# Patient Record
Sex: Female | Born: 1955
Health system: Southern US, Community
[De-identification: ages and names within clinical notes are randomized; demographics above are authoritative.]

## PROBLEM LIST (undated history)

## (undated) DIAGNOSIS — F32A Depression, unspecified: Secondary | ICD-10-CM

## (undated) DIAGNOSIS — N189 Chronic kidney disease, unspecified: Secondary | ICD-10-CM

## (undated) DIAGNOSIS — R350 Frequency of micturition: Secondary | ICD-10-CM

## (undated) DIAGNOSIS — E782 Mixed hyperlipidemia: Secondary | ICD-10-CM

## (undated) DIAGNOSIS — Z973 Presence of spectacles and contact lenses: Secondary | ICD-10-CM

## (undated) DIAGNOSIS — R42 Dizziness and giddiness: Secondary | ICD-10-CM

## (undated) DIAGNOSIS — M4800 Spinal stenosis, site unspecified: Secondary | ICD-10-CM

## (undated) DIAGNOSIS — N201 Calculus of ureter: Secondary | ICD-10-CM

## (undated) DIAGNOSIS — Z86010 Personal history of colonic polyps: Secondary | ICD-10-CM

## (undated) DIAGNOSIS — M1990 Unspecified osteoarthritis, unspecified site: Secondary | ICD-10-CM

## (undated) HISTORY — DX: Chronic kidney disease, unspecified: N18.9

## (undated) HISTORY — DX: Spinal stenosis, site unspecified: M48.00

## (undated) HISTORY — DX: Dizziness and giddiness: R42

## (undated) HISTORY — PX: COLONOSCOPY: SHX174

## (undated) HISTORY — PX: DILATION AND CURETTAGE OF UTERUS: SHX78

## (undated) HISTORY — DX: Unspecified osteoarthritis, unspecified site: M19.90

---

## 1974-10-14 HISTORY — PX: WISDOM TOOTH EXTRACTION: SHX21

## 1978-10-14 HISTORY — PX: DILATION AND CURETTAGE OF UTERUS: SHX78

## 1999-06-04 ENCOUNTER — Other Ambulatory Visit: Admission: RE | Admit: 1999-06-04 | Discharge: 1999-06-04 | Payer: Self-pay | Admitting: Internal Medicine

## 2000-06-06 ENCOUNTER — Other Ambulatory Visit: Admission: RE | Admit: 2000-06-06 | Discharge: 2000-06-06 | Payer: Self-pay | Admitting: Internal Medicine

## 2001-04-21 ENCOUNTER — Other Ambulatory Visit: Admission: RE | Admit: 2001-04-21 | Discharge: 2001-04-21 | Payer: Self-pay | Admitting: Internal Medicine

## 2002-04-20 ENCOUNTER — Other Ambulatory Visit: Admission: RE | Admit: 2002-04-20 | Discharge: 2002-04-20 | Payer: Self-pay | Admitting: Internal Medicine

## 2003-04-26 ENCOUNTER — Other Ambulatory Visit: Admission: RE | Admit: 2003-04-26 | Discharge: 2003-04-26 | Payer: Self-pay | Admitting: Internal Medicine

## 2004-05-01 ENCOUNTER — Other Ambulatory Visit: Admission: RE | Admit: 2004-05-01 | Discharge: 2004-05-01 | Payer: Self-pay | Admitting: Internal Medicine

## 2005-05-20 ENCOUNTER — Ambulatory Visit: Payer: Self-pay | Admitting: Internal Medicine

## 2005-05-24 ENCOUNTER — Ambulatory Visit: Payer: Self-pay | Admitting: Internal Medicine

## 2005-05-24 ENCOUNTER — Other Ambulatory Visit: Admission: RE | Admit: 2005-05-24 | Discharge: 2005-05-24 | Payer: Self-pay | Admitting: Internal Medicine

## 2005-06-21 ENCOUNTER — Ambulatory Visit: Payer: Self-pay | Admitting: Internal Medicine

## 2005-08-14 ENCOUNTER — Ambulatory Visit: Payer: Self-pay | Admitting: Internal Medicine

## 2005-08-15 ENCOUNTER — Encounter: Admission: RE | Admit: 2005-08-15 | Discharge: 2005-08-15 | Payer: Self-pay | Admitting: Internal Medicine

## 2005-08-28 ENCOUNTER — Ambulatory Visit: Payer: Self-pay | Admitting: Internal Medicine

## 2005-08-28 DIAGNOSIS — Z86010 Personal history of colonic polyps: Secondary | ICD-10-CM

## 2005-08-28 DIAGNOSIS — Z860101 Personal history of adenomatous and serrated colon polyps: Secondary | ICD-10-CM

## 2005-08-28 HISTORY — DX: Personal history of colonic polyps: Z86.010

## 2005-08-28 HISTORY — DX: Personal history of adenomatous and serrated colon polyps: Z86.0101

## 2006-06-20 ENCOUNTER — Ambulatory Visit: Payer: Self-pay | Admitting: Internal Medicine

## 2006-06-26 ENCOUNTER — Ambulatory Visit: Payer: Self-pay | Admitting: Internal Medicine

## 2006-06-26 ENCOUNTER — Other Ambulatory Visit: Admission: RE | Admit: 2006-06-26 | Discharge: 2006-06-26 | Payer: Self-pay | Admitting: Internal Medicine

## 2006-06-26 ENCOUNTER — Encounter: Payer: Self-pay | Admitting: Internal Medicine

## 2006-06-26 LAB — CONVERTED CEMR LAB

## 2006-09-10 ENCOUNTER — Encounter: Admission: RE | Admit: 2006-09-10 | Discharge: 2006-09-10 | Payer: Self-pay | Admitting: Internal Medicine

## 2007-06-25 ENCOUNTER — Ambulatory Visit: Payer: Self-pay | Admitting: Endocrinology

## 2007-06-25 LAB — CONVERTED CEMR LAB
ALT: 16 units/L (ref 0–35)
AST: 16 units/L (ref 0–37)
Albumin: 3.7 g/dL (ref 3.5–5.2)
Alkaline Phosphatase: 43 units/L (ref 39–117)
BUN: 8 mg/dL (ref 6–23)
Basophils Absolute: 0.1 10*3/uL (ref 0.0–0.1)
Basophils Relative: 0.9 % (ref 0.0–1.0)
Bilirubin Urine: NEGATIVE
Bilirubin, Direct: 0.1 mg/dL (ref 0.0–0.3)
CO2: 27 meq/L (ref 19–32)
CRP, High Sensitivity: 1 (ref 0.00–5.00)
Calcium: 8.7 mg/dL (ref 8.4–10.5)
Chloride: 107 meq/L (ref 96–112)
Cholesterol: 134 mg/dL (ref 0–200)
Creatinine, Ser: 0.7 mg/dL (ref 0.4–1.2)
Eosinophils Absolute: 0.1 10*3/uL (ref 0.0–0.6)
Eosinophils Relative: 2.2 % (ref 0.0–5.0)
GFR calc Af Amer: 113 mL/min
GFR calc non Af Amer: 93 mL/min
Glucose, Bld: 90 mg/dL (ref 70–99)
HCT: 37.4 % (ref 36.0–46.0)
HDL: 51.7 mg/dL (ref >39.0)
Hemoglobin, Urine: NEGATIVE
Hemoglobin: 12.9 g/dL (ref 12.0–15.0)
Ketones, ur: NEGATIVE mg/dL
LDL Cholesterol: 76 mg/dL (ref 0–99)
Leukocytes, UA: NEGATIVE
Lymphocytes Relative: 30.5 % (ref 12.0–46.0)
MCHC: 34.4 g/dL (ref 30.0–36.0)
MCV: 89.1 fL (ref 78.0–100.0)
Monocytes Absolute: 0.6 10*3/uL (ref 0.2–0.7)
Monocytes Relative: 10 % (ref 3.0–11.0)
Neutro Abs: 3.3 10*3/uL (ref 1.4–7.7)
Neutrophils Relative %: 56.4 % (ref 43.0–77.0)
Nitrite: NEGATIVE
Platelets: 245 10*3/uL (ref 150–400)
Potassium: 4.1 meq/L (ref 3.5–5.1)
RBC: 4.2 M/uL (ref 3.87–5.11)
RDW: 12.6 % (ref 11.5–14.6)
Rheumatoid fact SerPl-aCnc: <20 intl units/mL — ABNORMAL LOW (ref 0.0–20.0)
Sodium: 138 meq/L (ref 135–145)
Specific Gravity, Urine: 1.025 (ref 1.000–1.03)
TSH: 1.53 microintl units/mL (ref 0.35–5.50)
Total Bilirubin: 0.7 mg/dL (ref 0.3–1.2)
Total CHOL/HDL Ratio: 2.6
Total Protein, Urine: NEGATIVE mg/dL
Total Protein: 6.9 g/dL (ref 6.0–8.3)
Triglycerides: 33 mg/dL (ref 0–149)
Urine Glucose: NEGATIVE mg/dL
Urobilinogen, UA: 0.2 (ref 0.0–1.0)
VLDL: 7 mg/dL (ref 0–40)
WBC: 5.9 10*3/uL (ref 4.5–10.5)
pH: 6 (ref 5.0–8.0)

## 2007-06-29 ENCOUNTER — Ambulatory Visit: Payer: Self-pay | Admitting: Internal Medicine

## 2007-06-29 ENCOUNTER — Encounter: Payer: Self-pay | Admitting: Endocrinology

## 2007-06-29 LAB — CONVERTED CEMR LAB: Anti Nuclear Antibody(ANA): NEGATIVE

## 2007-06-30 ENCOUNTER — Encounter: Payer: Self-pay | Admitting: Internal Medicine

## 2007-06-30 DIAGNOSIS — E785 Hyperlipidemia, unspecified: Secondary | ICD-10-CM

## 2007-06-30 DIAGNOSIS — M2550 Pain in unspecified joint: Secondary | ICD-10-CM | POA: Insufficient documentation

## 2007-09-30 ENCOUNTER — Encounter: Admission: RE | Admit: 2007-09-30 | Discharge: 2007-09-30 | Payer: Self-pay | Admitting: Internal Medicine

## 2007-10-14 ENCOUNTER — Encounter: Admission: RE | Admit: 2007-10-14 | Discharge: 2007-10-14 | Payer: Self-pay | Admitting: Internal Medicine

## 2008-07-08 ENCOUNTER — Ambulatory Visit: Payer: Self-pay | Admitting: Internal Medicine

## 2008-07-08 LAB — CONVERTED CEMR LAB
ALT: 25 units/L (ref 0–35)
AST: 23 units/L (ref 0–37)
Albumin: 3.9 g/dL (ref 3.5–5.2)
Alkaline Phosphatase: 51 units/L (ref 39–117)
BUN: 17 mg/dL (ref 6–23)
Basophils Absolute: 0.1 10*3/uL (ref 0.0–0.1)
Basophils Relative: 1.1 % (ref 0.0–3.0)
Bilirubin Urine: NEGATIVE
Bilirubin, Direct: 0.2 mg/dL (ref 0.0–0.3)
CO2: 30 meq/L (ref 19–32)
Calcium: 9.2 mg/dL (ref 8.4–10.5)
Chloride: 111 meq/L (ref 96–112)
Cholesterol: 136 mg/dL (ref 0–200)
Creatinine, Ser: 0.8 mg/dL (ref 0.4–1.2)
Crystals: NEGATIVE
Eosinophils Absolute: 0.1 10*3/uL (ref 0.0–0.7)
Eosinophils Relative: 2 % (ref 0.0–5.0)
GFR calc Af Amer: 96 mL/min
GFR calc non Af Amer: 80 mL/min
Glucose, Bld: 95 mg/dL (ref 70–99)
HCT: 39.1 % (ref 36.0–46.0)
HDL: 54 mg/dL (ref >39.0)
Hemoglobin: 13.4 g/dL (ref 12.0–15.0)
Ketones, ur: NEGATIVE mg/dL
LDL Cholesterol: 74 mg/dL (ref 0–99)
Leukocytes, UA: NEGATIVE
Lymphocytes Relative: 25.7 % (ref 12.0–46.0)
MCHC: 34.4 g/dL (ref 30.0–36.0)
MCV: 89.6 fL (ref 78.0–100.0)
Monocytes Absolute: 0.7 10*3/uL (ref 0.1–1.0)
Monocytes Relative: 9.7 % (ref 3.0–12.0)
Mucus, UA: NEGATIVE
Neutro Abs: 4.2 10*3/uL (ref 1.4–7.7)
Neutrophils Relative %: 61.5 % (ref 43.0–77.0)
Nitrite: NEGATIVE
Platelets: 235 10*3/uL (ref 150–400)
Potassium: 4.5 meq/L (ref 3.5–5.1)
RBC: 4.37 M/uL (ref 3.87–5.11)
RDW: 12.8 % (ref 11.5–14.6)
Sodium: 143 meq/L (ref 135–145)
Specific Gravity, Urine: 1.025 (ref 1.000–1.03)
TSH: 1.72 microintl units/mL (ref 0.35–5.50)
Total Bilirubin: 0.5 mg/dL (ref 0.3–1.2)
Total CHOL/HDL Ratio: 2.5
Total Protein, Urine: NEGATIVE mg/dL
Total Protein: 7.3 g/dL (ref 6.0–8.3)
Triglycerides: 42 mg/dL (ref 0–149)
Urine Glucose: NEGATIVE mg/dL
Urobilinogen, UA: 0.2 (ref 0.0–1.0)
VLDL: 8 mg/dL (ref 0–40)
WBC: 6.9 10*3/uL (ref 4.5–10.5)
pH: 6.5 (ref 5.0–8.0)

## 2008-07-14 ENCOUNTER — Other Ambulatory Visit: Admission: RE | Admit: 2008-07-14 | Discharge: 2008-07-14 | Payer: Self-pay | Admitting: Internal Medicine

## 2008-07-14 ENCOUNTER — Encounter: Payer: Self-pay | Admitting: Internal Medicine

## 2008-07-14 ENCOUNTER — Ambulatory Visit: Payer: Self-pay | Admitting: Internal Medicine

## 2008-07-19 ENCOUNTER — Encounter: Payer: Self-pay | Admitting: Internal Medicine

## 2008-10-03 ENCOUNTER — Encounter: Admission: RE | Admit: 2008-10-03 | Discharge: 2008-10-03 | Payer: Self-pay | Admitting: Internal Medicine

## 2008-10-13 ENCOUNTER — Encounter: Admission: RE | Admit: 2008-10-13 | Discharge: 2008-10-13 | Payer: Self-pay | Admitting: Internal Medicine

## 2008-10-20 ENCOUNTER — Encounter: Payer: Self-pay | Admitting: Internal Medicine

## 2009-06-13 ENCOUNTER — Telehealth: Payer: Self-pay | Admitting: Internal Medicine

## 2009-07-26 ENCOUNTER — Ambulatory Visit: Payer: Self-pay | Admitting: Internal Medicine

## 2009-07-26 LAB — CONVERTED CEMR LAB
ALT: 23 units/L (ref 0–35)
AST: 25 units/L (ref 0–37)
Albumin: 3.9 g/dL (ref 3.5–5.2)
Alkaline Phosphatase: 57 units/L (ref 39–117)
BUN: 15 mg/dL (ref 6–23)
Basophils Absolute: 0 10*3/uL (ref 0.0–0.1)
Basophils Relative: 0.8 % (ref 0.0–3.0)
Bilirubin Urine: NEGATIVE
Bilirubin, Direct: 0 mg/dL (ref 0.0–0.3)
CO2: 29 meq/L (ref 19–32)
Calcium: 9.4 mg/dL (ref 8.4–10.5)
Chloride: 104 meq/L (ref 96–112)
Cholesterol: 181 mg/dL (ref 0–200)
Creatinine, Ser: 0.7 mg/dL (ref 0.4–1.2)
Eosinophils Absolute: 0.1 10*3/uL (ref 0.0–0.7)
Eosinophils Relative: 1.7 % (ref 0.0–5.0)
GFR calc non Af Amer: 92.59 mL/min (ref >60)
Glucose, Bld: 83 mg/dL (ref 70–99)
HCT: 37.6 % (ref 36.0–46.0)
HDL: 56 mg/dL (ref >39.00)
Hemoglobin: 13 g/dL (ref 12.0–15.0)
Ketones, ur: NEGATIVE mg/dL
LDL Cholesterol: 113 mg/dL — ABNORMAL HIGH (ref 0–99)
Leukocytes, UA: NEGATIVE
Lymphocytes Relative: 30.2 % (ref 12.0–46.0)
Lymphs Abs: 1.6 10*3/uL (ref 0.7–4.0)
MCHC: 34.7 g/dL (ref 30.0–36.0)
MCV: 90.6 fL (ref 78.0–100.0)
Monocytes Absolute: 0.5 10*3/uL (ref 0.1–1.0)
Monocytes Relative: 10.1 % (ref 3.0–12.0)
Neutro Abs: 3.2 10*3/uL (ref 1.4–7.7)
Neutrophils Relative %: 57.2 % (ref 43.0–77.0)
Nitrite: NEGATIVE
Platelets: 264 10*3/uL (ref 150.0–400.0)
Potassium: 4.1 meq/L (ref 3.5–5.1)
RBC: 4.15 M/uL (ref 3.87–5.11)
RDW: 12.5 % (ref 11.5–14.6)
Sodium: 142 meq/L (ref 135–145)
Specific Gravity, Urine: 1.025 (ref 1.000–1.030)
TSH: 2.24 microintl units/mL (ref 0.35–5.50)
Total Bilirubin: 0.9 mg/dL (ref 0.3–1.2)
Total CHOL/HDL Ratio: 3
Total Protein, Urine: NEGATIVE mg/dL
Total Protein: 7.8 g/dL (ref 6.0–8.3)
Triglycerides: 62 mg/dL (ref 0.0–149.0)
Urine Glucose: NEGATIVE mg/dL
Urobilinogen, UA: 0.2 (ref 0.0–1.0)
VLDL: 12.4 mg/dL (ref 0.0–40.0)
WBC: 5.4 10*3/uL (ref 4.5–10.5)
pH: 5 (ref 5.0–8.0)

## 2009-07-31 ENCOUNTER — Ambulatory Visit: Payer: Self-pay | Admitting: Internal Medicine

## 2009-07-31 ENCOUNTER — Encounter: Payer: Self-pay | Admitting: Internal Medicine

## 2009-07-31 ENCOUNTER — Other Ambulatory Visit: Admission: RE | Admit: 2009-07-31 | Discharge: 2009-07-31 | Payer: Self-pay | Admitting: Internal Medicine

## 2009-08-21 ENCOUNTER — Encounter: Payer: Self-pay | Admitting: Internal Medicine

## 2009-10-04 ENCOUNTER — Encounter: Admission: RE | Admit: 2009-10-04 | Discharge: 2009-10-04 | Payer: Self-pay | Admitting: Internal Medicine

## 2010-07-19 ENCOUNTER — Encounter: Payer: Self-pay | Admitting: Internal Medicine

## 2010-08-24 ENCOUNTER — Ambulatory Visit: Payer: Self-pay | Admitting: Internal Medicine

## 2010-08-24 LAB — CONVERTED CEMR LAB
ALT: 19 units/L (ref 0–35)
AST: 23 units/L (ref 0–37)
Albumin: 4.1 g/dL (ref 3.5–5.2)
Alkaline Phosphatase: 61 units/L (ref 39–117)
BUN: 17 mg/dL (ref 6–23)
Basophils Absolute: 0.1 10*3/uL (ref 0.0–0.1)
Basophils Relative: 0.7 % (ref 0.0–3.0)
Bilirubin Urine: NEGATIVE
Bilirubin, Direct: 0.1 mg/dL (ref 0.0–0.3)
CO2: 28 meq/L (ref 19–32)
Calcium: 9.7 mg/dL (ref 8.4–10.5)
Chloride: 104 meq/L (ref 96–112)
Cholesterol: 171 mg/dL (ref 0–200)
Creatinine, Ser: 0.7 mg/dL (ref 0.4–1.2)
Eosinophils Absolute: 0.1 10*3/uL (ref 0.0–0.7)
Eosinophils Relative: 1.1 % (ref 0.0–5.0)
GFR calc non Af Amer: 98.7 mL/min (ref >60)
Glucose, Bld: 77 mg/dL (ref 70–99)
HCT: 40.8 % (ref 36.0–46.0)
HDL: 68 mg/dL (ref >39.00)
Hemoglobin: 14.1 g/dL (ref 12.0–15.0)
Ketones, ur: 40 mg/dL
LDL Cholesterol: 94 mg/dL (ref 0–99)
Leukocytes, UA: NEGATIVE
Lymphocytes Relative: 28.1 % (ref 12.0–46.0)
Lymphs Abs: 2.1 10*3/uL (ref 0.7–4.0)
MCHC: 34.5 g/dL (ref 30.0–36.0)
MCV: 90.2 fL (ref 78.0–100.0)
Monocytes Absolute: 0.6 10*3/uL (ref 0.1–1.0)
Monocytes Relative: 8.7 % (ref 3.0–12.0)
Neutro Abs: 4.5 10*3/uL (ref 1.4–7.7)
Neutrophils Relative %: 61.4 % (ref 43.0–77.0)
Nitrite: NEGATIVE
Platelets: 273 10*3/uL (ref 150.0–400.0)
Potassium: 4.4 meq/L (ref 3.5–5.1)
RBC: 4.53 M/uL (ref 3.87–5.11)
RDW: 13 % (ref 11.5–14.6)
Sodium: 139 meq/L (ref 135–145)
Specific Gravity, Urine: >=1.03 (ref 1.000–1.030)
TSH: 1.59 microintl units/mL (ref 0.35–5.50)
Total Bilirubin: 0.7 mg/dL (ref 0.3–1.2)
Total CHOL/HDL Ratio: 3
Total Protein, Urine: NEGATIVE mg/dL
Total Protein: 7.5 g/dL (ref 6.0–8.3)
Triglycerides: 44 mg/dL (ref 0.0–149.0)
Urine Glucose: NEGATIVE mg/dL
Urobilinogen, UA: 0.2 (ref 0.0–1.0)
VLDL: 8.8 mg/dL (ref 0.0–40.0)
WBC: 7.3 10*3/uL (ref 4.5–10.5)
pH: 5 (ref 5.0–8.0)

## 2010-08-30 ENCOUNTER — Other Ambulatory Visit: Admission: RE | Admit: 2010-08-30 | Discharge: 2010-08-30 | Payer: Self-pay | Admitting: Internal Medicine

## 2010-08-30 ENCOUNTER — Ambulatory Visit: Payer: Self-pay | Admitting: Internal Medicine

## 2010-08-30 ENCOUNTER — Encounter: Payer: Self-pay | Admitting: Internal Medicine

## 2010-08-30 DIAGNOSIS — K635 Polyp of colon: Secondary | ICD-10-CM | POA: Insufficient documentation

## 2010-08-30 LAB — CONVERTED CEMR LAB: Pap Smear: NEGATIVE

## 2010-09-08 ENCOUNTER — Encounter: Payer: Self-pay | Admitting: Internal Medicine

## 2010-10-04 ENCOUNTER — Encounter: Payer: Self-pay | Admitting: Internal Medicine

## 2010-10-12 ENCOUNTER — Encounter
Admission: RE | Admit: 2010-10-12 | Discharge: 2010-10-12 | Payer: Self-pay | Source: Home / Self Care | Attending: Internal Medicine | Admitting: Internal Medicine

## 2010-10-14 HISTORY — PX: COLONOSCOPY: SHX174

## 2010-11-03 ENCOUNTER — Encounter: Payer: Self-pay | Admitting: Internal Medicine

## 2010-11-04 ENCOUNTER — Encounter: Payer: Self-pay | Admitting: Internal Medicine

## 2010-11-08 ENCOUNTER — Encounter (INDEPENDENT_AMBULATORY_CARE_PROVIDER_SITE_OTHER): Payer: Self-pay | Admitting: *Deleted

## 2010-11-09 ENCOUNTER — Ambulatory Visit
Admission: RE | Admit: 2010-11-09 | Discharge: 2010-11-09 | Payer: Self-pay | Source: Home / Self Care | Attending: Gastroenterology | Admitting: Gastroenterology

## 2010-11-13 NOTE — Letter (Signed)
Summary: Endoscopy Letter  [facility]  [address] [place], [place] [address]   Phone: [phone]  Fax: [phone]      July 19, 2010 MRN: [id number]   [facility] [address] [place], [place]  [address]   Dear Ms. Broers,   According to your medical record, it is time for you to schedule an Endoscopy. Endoscopic screening is recommended for patients with certain upper digestive tract conditions because of associated increased risk for cancers of the upper digestive system.  This letter has been generated based on the recommendations made at the time of your prior procedure. If you feel that in your particular situation this may no longer apply, please contact our office.  Please call our office at [phone]) to schedule this appointment or to update your records at your earliest convenience.  Thank you for cooperating with [place] to provide you with the very best care possible.   Sincerely,  Wilhemina Bonito. Marina Goodell, M.D.  [facility] Gastroenterology Division [phone]

## 2010-11-13 NOTE — Assessment & Plan Note (Signed)
Summary: CPX/#/CD  PRESSED 1 TO CX/ LMOM TO CONFIRM/NWS   Vital Signs:  Patient profile:   55 year old female Height:      65 inches Weight:      160 pounds BMI:     26.72 O2 Sat:      97 % on Room air Temp:     97.6 degrees F oral Pulse rate:   80 / minute BP sitting:   124 / 88  (left arm) Cuff size:   regular  Vitals Entered By: Bill Salinas CMA (August 30, 2010 1:36 PM)  O2 Flow:  Room air CC: cpx/ ab   Primary Care Provider:  Chadrick Sprinkle  CC:  cpx/ ab.  History of Present Illness: Patient reports that in April she "threw out her back and was unable to walk and lost flexibility and had constant pain. She went to the podiatrist and was diagnosed with plantar fascititis bilaterally. She went to the Chiropractor who diagnosed disk damage in the low back. she has had a series of adjustments and is now dong better. She was assigned a series of back exercises but she has not had time to do this.   Current Medications (verified): 1)  Vytorin 10-20 Mg  Tabs (Ezetimibe-Simvastatin) .... Q Pm  Allergies (verified): No Known Drug Allergies  Past History:  Past Medical History: Last updated: 06/30/2007 UCD Hyperlipidemia  Past Surgical History: Last updated: 06/30/2007 D&C C-section x 2  Family History: Last updated: 07/17/08 GI cancer-grandmother died of cancer of unknown origin Father died CHF at 93 Mother  1926: with CAD/MI Brother with CABG at 42 Maternal Aunt with MI at 44 hyperlipidemia Neg-breast or colon cancer; DM  Social History: Last updated: 07/17/2008 [employer] - [employer], [employer] -BA-music married '84.  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    Physical Exam  General:  moderately overweight white woman in no distress Head:  Normocephalic and atraumatic without obvious abnormalities. No apparent alopecia or balding. Eyes:  No corneal or conjunctival inflammation noted. EOMI. Perrla. Funduscopic exam benign, without hemorrhages, exudates or papilledema. Vision grossly normal. Ears:  External ear exam shows no significant lesions or deformities.  Otoscopic examination reveals clear canals, tympanic membranes are intact bilaterally without bulging, retraction, inflammation or discharge. Hearing is grossly normal bilaterally. Nose:  no external deformity and no external erythema.   Mouth:  Oral mucosa and oropharynx without lesions or exudates.  Teeth in good repair. Neck:  supple, no thyromegaly, and no carotid bruits.   Chest Wall:  no deformities and no mass.   Breasts:  No mass, nodules, thickening, tenderness, bulging, retraction, inflamation, nipple discharge or skin changes noted.   Lungs:  Normal respiratory effort, chest expands symmetrically. Lungs are clear to auscultation, no crackles or wheezes. Heart:  Normal rate and regular rhythm. S1 and S2 normal without gallop, murmur, click, rub or other extra sounds. Abdomen:  soft, non-tender, normal bowel sounds, no guarding, and no hepatomegaly.   Genitalia:  Pelvic Exam:        External: normal female genitalia without lesions or masses        Vagina: normal without lesions or masses  Cervix: normal without lesions or masses        Adnexa: normal bimanual exam without masses or fullness        Uterus: normal by palpation        Pap smear: performed Msk:  normal ROM, no joint tenderness, no joint swelling, no joint warmth, and no joint instability.   Pulses:  2+ radial and DP pulses Extremities:  No clubbing, cyanosis, edema, or deformity noted with normal full range of motion of all joints.   Neurologic:   alert & oriented X3, cranial nerves II-XII intact, strength normal in all extremities, sensation intact to pinprick, gait normal, and DTRs symmetrical and normal.   Skin:  turgor normal, color normal, no rashes, no suspicious lesions, no petechiae, and no ulcerations.   Cervical Nodes:  no anterior cervical adenopathy.   Axillary Nodes:  no R axillary adenopathy and no L axillary adenopathy.   Psych:  Oriented X3, memory intact for recent and remote, normally interactive, and good eye contact.     Impression & Recommendations:  Problem # 1:  HYPERLIPIDEMIA (ICD-272.4) Great control on present medication.  Plan - continue present regimen  Her updated medication list for this problem includes:    Vytorin 10-20 Mg Tabs (Ezetimibe-simvastatin) ..... Q pm  Problem # 2:  Preventive Health Care (ICD-V70.0) Interval history significant for foot and back pain for which she has been seen. She is starting to feel better. Her exam is unremarkable with no evidence of radiculopathy.  If her pain continues will be happy to assist with referrals as needed.  Normal well woman exam as noted. Her lab results are within normal limits. She is current with colorectal cancer screening with last colonoscopy Nov '06 and is referred for 5 year followup. Immunizations not documented for tetnus and influenza.   Weight management discussed in detail: she is counseled about smart food choices, portion size control incluidng the meal of [address] chews and multiple small feedings while giving up candy (which she had done!) and junk calories., and regluar exercise - 30 minutes 3 times a week with a target heart rate of 120. Goal is to loose 1 lb/month with a target of 25 lbs total weight loss.  In summary - a very nice woman who appears to be medically stable at this time. she will return as needed or in 1 year.   Complete Medication List: 1)  Vytorin 10-20 Mg Tabs (Ezetimibe-simvastatin) .... Q pm  Other  Orders: Gastroenterology Referral (GI)  Patient: Tina Aguilar Note: All result statuses are Final unless otherwise noted.  Tests: (1) Lipid Panel (LIPID)   Cholesterol               171 mg/dL                   6-045     ATP III Classification            Desirable:  < 200 mg/dL                    Borderline High:  200 - 239 mg/dL               High:  > = 240 mg/dL   Triglycerides             44.0 mg/dL                  4.0-981.1     Normal:  <150 mg/dL  Borderline High:  150 - 199 mg/dL   HDL                       62.95 mg/dL                 >28.41   VLDL Cholesterol          8.8 mg/dL                   3.2-44.0   LDL Cholesterol           94 mg/dL                    1-02  CHO/HDL Ratio:  CHD Risk                             3                    Men          Women     1/2 Average Risk     3.4          3.3     Average Risk          5.0          4.4     2X Average Risk          9.6          7.1     3X Average Risk          15.0          11.0                           Tests: (2) BMP (METABOL)   Sodium                    139 mEq/L                   135-145   Potassium                 4.4 mEq/L                   3.5-5.1   Chloride                  104 mEq/L                   96-112   Carbon Dioxide            28 mEq/L                    19-32   Glucose                   77 mg/dL                    72-53   BUN                       17 mg/dL                    6-64   Creatinine                0.7 mg/dL  0.4-1.2   Calcium                   9.7 mg/dL                   1.6-10.9   GFR                       98.70 mL/min                >60  Tests: (3) CBC Platelet w/Diff (CBCD)   White Cell Count          7.3 K/uL                    4.5-10.5   Red Cell Count            4.53 Mil/uL                 3.87-5.11   Hemoglobin                14.1 g/dL                   60.4-54.0   Hematocrit                40.8 %                      36.0-46.0   MCV                        90.2 fl                     78.0-100.0   MCHC                      34.5 g/dL                   98.1-19.1   RDW                       13.0 %                      11.5-14.6   Platelet Count            273.0 K/uL                  150.0-400.0   Neutrophil %              61.4 %                      43.0-77.0   Lymphocyte %              28.1 %                      12.0-46.0   Monocyte %                8.7 %                       3.0-12.0   Eosinophils%              1.1 %                       0.0-5.0   Basophils %  0.7 %                       0.0-3.0   Neutrophill Absolute      4.5 K/uL                    1.4-7.7   Lymphocyte Absolute       2.1 K/uL                    0.7-4.0   Monocyte Absolute         0.6 K/uL                    0.1-1.0  Eosinophils, Absolute                             0.1 K/uL                    0.0-0.7   Basophils Absolute        0.1 K/uL                    0.0-0.1  Tests: (4) Hepatic/Liver Function Panel (HEPATIC)   Total Bilirubin           0.7 mg/dL                   0.9-8.1   Direct Bilirubin          0.1 mg/dL                   1.9-1.4   Alkaline Phosphatase      61 U/L                      39-117   AST                       23 U/L                      0-37   ALT                       19 U/L                      0-35   Total Protein             7.5 g/dL                    7.8-2.9   Albumin                   4.1 g/dL                    5.6-2.1  Tests: (5) TSH (TSH)   FastTSH                   1.59 uIU/mL                 0.35-5.50  Tests: (6) UDip w/Micro (URINE)   Color                     LT. YELLOW       RANGE:  Yellow;Lt. Yellow   Clarity  CLEAR                       Clear   Specific Gravity          >=1.030                     1.000 - 1.030   Urine Ph                  5.0                         5.0-8.0   Protein                   NEGATIVE                    Negative   Urine Glucose             NEGATIVE                     Negative   Ketones                   40                          Negative   Urine Bilirubin           NEGATIVE                    Negative   Blood                     SMALL                       Negative   Urobilinogen              0.2                         0.0 - 1.0   Leukocyte Esterace        NEGATIVE                    Negative   Nitrite                   NEGATIVE                    Negative   Urine WBC                 0-2/hpf                     0-2/hpf   Urine RBC                 3-6/hpf                     0-2/hpf   Urine Epith               Few(5-10/hpf)               Rare(0-4/hpf)   Urine Bacteria            Rare(<10/hpf)               None  Orders Added: 1)  Gastroenterology Referral [GI] 2)  Est. Patient 40-64 years [phone]

## 2010-11-13 NOTE — Letter (Signed)
   [facility] [address] [place], [place]  [address] Phone: [phone]      September 09, 2010   [facility] [address] [place], [place] [address]  RE:  LAB RESULTS  Dear  Ms. Strahm,  The following is an interpretation of your most recent lab tests.  Please take note of any instructions provided or changes to medications that have resulted from your lab work.  Pap Smear: normal    Have a great Holiday season and a good year.   Sincerely Yours,    Jacques Navy MD

## 2010-11-15 NOTE — Letter (Signed)
Summary: Pre Visit Letter Revised  [facility]  [address] [place], [place] [address]   Phone: [phone]  Fax: [phone]        10/04/2010 MRN: [id number]  [facility] [address] [place] [place]  [address]             Procedure Date:  11-23-10  9:30am            Dr Marina Goodell - Recall Colon  Welcome to the Gastroenterology Division at [facility].    You are scheduled to see a nurse for your pre-procedure visit on 11-09-10 at 4pm on the 3rd floor at [facility], [address]  We ask that you try to arrive at our office 15 minutes prior to your appointment time to allow for check-in.  Please take a minute to review the attached form.  If you answer "Yes" to one or more of the questions on the first page, we ask that you call the person listed at your earliest opportunity.  If you answer "No" to all of the questions, please complete the rest of the form and bring it to your appointment.    Your nurse visit will consist of discussing your medical and surgical history, your immediate family medical history, and your medications.   If you are unable to list all of your medications on the form, please bring the medication bottles to your appointment and we will list them.  We will need to be aware of both prescribed and over the counter drugs.  We will need to know exact dosage information as well.    Please be prepared to read and sign documents such as consent forms, a financial agreement, and acknowledgement forms.  If necessary, and with your consent, a friend or relative is welcome to sit-in on the nurse visit with you.  Please bring your insurance card so that we may make a copy of it.  If your insurance requires a referral to see a specialist, please bring your referral form from your primary care physician.  No co-pay is required for this nurse visit.     If you cannot keep your appointment, please call [phone] to cancel or reschedule  prior to your appointment date.  This allows [place] the opportunity to schedule an appointment for another patient in need of care.    Thank you for choosing [facility] for your medical needs.  We appreciate the opportunity to care for you.  Please visit [place] at our website  to learn more about our practice.  Sincerely, The Gastroenterology Division

## 2010-11-15 NOTE — Letter (Signed)
Summary: [facility] Instructions  [facility]  [address] [place], [place] [address]   Phone: [phone]  Fax: [phone]       Tina Aguilar    1956-03-15    MRN: [id number]        Procedure Day Dorna Bloom:   Farrell Ours  11/23/10     Arrival Time:  8:30AM     Procedure Time:  9:30AM     Location of Procedure:                    Juliann Pares _  [facility] (4th Floor)  PREPARATION FOR COLONOSCOPY WITH MOVIPREP   Starting 5 days prior to your procedure 11/18/10 do not eat nuts, seeds, popcorn, corn, beans, peas,  salads, or any raw vegetables.  Do not take any fiber supplements (e.g. Metamucil, Citrucel, and Benefiber).  THE DAY BEFORE YOUR PROCEDURE         DATE: 11/22/10  DAY: THURSDAY  1.  Drink clear liquids the entire day-NO SOLID FOOD  2.  Do not drink anything colored red or purple.  Avoid juices with pulp.  No orange juice.  3.  Drink at least 64 oz. (8 glasses) of fluid/clear liquids during the day to prevent dehydration and help the prep work efficiently.  CLEAR LIQUIDS INCLUDE: Water Jello Ice Popsicles Tea (sugar ok, no milk/cream) Powdered fruit flavored drinks Coffee (sugar ok, no milk/cream) Gatorade Juice: apple, white grape, white cranberry  Lemonade Clear bullion, consomm, broth Carbonated beverages (any kind) Strained chicken noodle soup Hard Candy                             4.  In the morning, mix first dose of MoviPrep solution:    Empty 1 Pouch A and 1 Pouch B into the disposable container    Add lukewarm drinking water to the top line of the container. Mix to dissolve    Refrigerate (mixed solution should be used within 24 hrs)  5.  Begin drinking the prep at 5:00 p.m. The MoviPrep container is divided by 4 marks.   Every 15 minutes drink the solution down to the next mark (approximately 8 oz) until the full liter is complete.   6.  Follow completed prep with 16 oz of clear liquid of your choice (Nothing red or purple).   Continue to drink clear liquids until bedtime.  7.  Before going to bed, mix second dose of MoviPrep solution:    Empty 1 Pouch A and 1 Pouch B into the disposable container    Add lukewarm drinking water to the top line of the container. Mix to dissolve    Refrigerate  THE DAY OF YOUR PROCEDURE      DATE: 11/23/10  DAY: FRIDAY  Beginning at 4:30AM (5 hours before procedure):         1. Every 15 minutes, drink the solution down to the next mark (approx 8 oz) until the full liter is complete.  2. Follow completed prep with 16 oz. of clear liquid of your choice.    3. You may drink clear liquids until 7:30AM (2 HOURS BEFORE PROCEDURE).   MEDICATION INSTRUCTIONS  Unless otherwise instructed, you should take regular prescription medications with a small sip of water   as early as possible the morning of your procedure.         OTHER INSTRUCTIONS  You will need a responsible adult at  least 56 years of age to accompany you and drive you home.   This person must remain in the waiting room during your procedure.  Wear loose fitting clothing that is easily removed.  Leave jewelry and other valuables at home.  However, you may wish to bring a book to read or  an iPod/MP3 player to listen to music as you wait for your procedure to start.  Remove all body piercing jewelry and leave at home.  Total time from sign-in until discharge is approximately 2-3 hours.  You should go home directly after your procedure and rest.  You can resume normal activities the  day after your procedure.  The day of your procedure you should not:   Drive   Make legal decisions   Operate machinery   Drink alcohol   Return to work  You will receive specific instructions about eating, activities and medications before you leave.    The above instructions have been reviewed and explained to me by   Wyona Almas RN  November 09, 2010 4:20 PM     I fully understand and can verbalize these  instructions _____________________________ Date _________

## 2010-11-15 NOTE — Miscellaneous (Signed)
Summary: [facility] Previsit/prep  Clinical Lists Changes  Medications: Added new medication of MOVIPREP 100 GM  SOLR (PEG-KCL-NACL-NASULF-NA ASC-C) As per prep instructions. - Signed Rx of MOVIPREP 100 GM  SOLR (PEG-KCL-NACL-NASULF-NA ASC-C) As per prep instructions.;  #1 x 0;  Signed;  Entered by: Wyona Almas RN;  Authorized by: Rachael Fee MD;  Method used: Electronically to [facility], [place], [place], [place]  [address], Ph: [phone], Fax: [phone] Observations: Added new observation of NKA: T (11/09/2010 15:43)    Prescriptions: MOVIPREP 100 GM  SOLR (PEG-KCL-NACL-NASULF-NA ASC-C) As per prep instructions.  #1 x 0   Entered by:   Wyona Almas RN   Authorized by:   Rachael Fee MD   Signed by:   Wyona Almas RN on 11/09/2010   Method used:   Electronically to        [facility] (retail)       [address]       [place], [place]  [address]       Ph: [phone]       Fax: [phone]   RxID:   [id number]

## 2010-11-23 ENCOUNTER — Other Ambulatory Visit (AMBULATORY_SURGERY_CENTER): Payer: Federal, State, Local not specified - PPO | Admitting: Internal Medicine

## 2010-11-23 ENCOUNTER — Encounter: Payer: Self-pay | Admitting: Internal Medicine

## 2010-11-23 DIAGNOSIS — Z86010 Personal history of colonic polyps: Secondary | ICD-10-CM

## 2010-11-23 DIAGNOSIS — Z1211 Encounter for screening for malignant neoplasm of colon: Secondary | ICD-10-CM

## 2010-11-29 NOTE — Procedures (Addendum)
Summary: Colonoscopy  Patient: [name] Note: All result statuses are Final unless otherwise noted.  Tests: (1) Colonoscopy (COL)   COL Colonoscopy           DONE     [facility]     [address]     [place], [place]  [address]           COLONOSCOPY PROCEDURE REPORT           PATIENT:  Tina Aguilar, Tina Aguilar  MR#:  [id number]     BIRTHDATE:  1956-05-15, 54 yrs. old  GENDER:  female     ENDOSCOPIST:  Wilhemina Bonito. Eda Keys, MD     REF. BY:  Surveillance Program Recall,     PROCEDURE DATE:  11/23/2010     PROCEDURE:  Surveillance Colonoscopy     ASA CLASS:  Class II     INDICATIONS:  history of pre-cancerous (adenomatous) colon polyps,     surveillance and high-risk screening ;index exam [id number] w/ small     adenomatous appearring polyp (w/o tissue on path)     MEDICATIONS:   Fentanyl 100 mcg IV, Versed 6 mg IV           DESCRIPTION OF PROCEDURE:   After the risks benefits and     alternatives of the procedure were thoroughly explained, informed     consent was obtained.  Digital rectal exam was performed and     revealed external hemorrhoids.   The LB 180AL [id number] endoscope     was introduced through the anus and advanced to the cecum, which     was identified by both the appendix and ileocecal valve, without     limitations.Time to cecum = 2:08 min.  The quality of the prep was     excellent, using MoviPrep.  The instrument was then slowly     withdrawn (time = 7:28 min) as the colon was fully examined.     <<PROCEDUREIMAGES>>           FINDINGS:  A normal appearing cecum, ileocecal valve, and     appendiceal orifice were identified. The ascending, hepatic     flexure, transverse, splenic flexure, descending, sigmoid colon,     and rectum appeared unremarkable.  No polyps or cancers were seen.     Retroflexed views in the rectum revealed no abnormalities.    The     scope was then withdrawn from the patient and the procedure     completed.           COMPLICATIONS:   None     ENDOSCOPIC IMPRESSION:     1) Normal colon     2) No polyps or cancers           RECOMMENDATIONS:     1) Continue current colorectal screening recommendations for     "routine risk" patients with a repeat colonoscopy in 10 years.           ______________________________     Wilhemina Bonito. Eda Keys, MD           CC:  Jacques Navy, MD; The Patient           n.     eSIGNED:   Wilhemina Bonito. Eda Keys at 11/23/2010 10:55 AM           Trey Sailors, [phone]  Note: An exclamation mark (!) indicates a result that was not dispersed into the flowsheet. Document Creation Date: 11/23/2010 10:55  AM _______________________________________________________________________  (1) Order result status: Final Collection or observation date-time: 11/23/2010 10:49 Requested date-time:  Receipt date-time:  Reported date-time:  Referring Physician:   Ordering Physician: Fransico Setters [phone]) Specimen Source:  Source: Launa Grill Order Number: [id number] Lab site:   Appended Document: Colonoscopy    Clinical Lists Changes  Observations: Added new observation of COLONNXTDUE: 11/2020 (11/23/2010 11:54)

## 2011-03-01 NOTE — Assessment & Plan Note (Signed)
[facility]                             PRIMARY CARE OFFICE NOTE   AYME, SHORT                        MRN:          [id number]  DATE:06/26/2006                            DOB:          07-25-1956    HISTORY OF PRESENT ILLNESS:  The patient reports that he is feeling well and  doing well with no new medical complaint. The patient was last seen in the  office on May 24, 2005, for routine examination.   PAST SURGICAL HISTORY:  1. D&C secondary to miscarriage.  2. Cesarean section x2.   PAST MEDICAL HISTORY:  1. Usual childhood diseases.  2. Hyperlipidemia.   HABITS:  No tobacco. Rare alcohol.   ALLERGIES:  NO KNOWN DRUG ALLERGIES.   FAMILY HISTORY:  Family member with GI cancer. Father died of congestive  heart failure at 49. Mother also had heart disease. One brother had bypass  surgery at age 52. Maternal aunt died of myocardial infarction at age 80.  Family history is positive for hyperlipidemia.   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 The patient has been married for 22 years. She has 2 grown daughters.  The patient's husband is in his 61's and in good health.   REVIEW OF SYSTEMS:  Negative for any constitutional, cardiovascular,  respiratory, GI, or GU problems.   PHYSICAL EXAMINATION:  VITAL SIGNS:  Temperature 97, blood pressure 137/87,  pulse 80, weight 150.  GENERAL:  A well-nourished, well-groomed woman looking at her near stated  chronologic age in no acute distress.  HEENT:  Normocephalic and atraumatic. EAC's and TM's were unremarkable.  Oropharynx and native dentition, in good repair. No buccal lesions were  noted. Posterior pharynx was clear. Conjunctivae and sclerae were clear.  PERRLA. EO MI.  Funduscopic examination unremarkable.  NECK:  Supple without thyromegaly.  LYMPH:  No adenopathy was noted in  the cervical or supraclavicular regions.  CHEST:  No CVA tenderness.  LUNGS:  Clear to auscultation and percussion.  BREAST:  Skin normal. Nipples without discharge. No fixed mass lesion or  abnormalities were noted.  CARDIOVASCULAR:  2+ radial pulses. No JVD. No carotid bruits. She had a  quite precordium. She had a regular rate and rhythm without murmur, rub, or  gallop.  ABDOMEN:  Soft. No guarding, no rebound. No organomegaly or  hepatosplenomegaly was appreciated.  PELVIC:  GU BUS was normal. Vaginal mucosa appeared normal. Cervix appeared  parous. Pap scrapping and then cervical brushing performed without  difficulty. Bi-manual examination revealed no adnexal abnormality or  tenderness. There was no cervical motion tenderness. Recto-vaginal  examination revealed normal posterior vaginal wall.  EXTREMITIES:  Without clubbing, cyanosis, edema, or deformity.  NEUROLOGIC:  Examination non-focal.   LABORATORY DATA:  Hemoglobin was 13.5. White count 6,300 with a normal  differential. Platelet count 274,000. Cholesterol 135, triglycerides 33, HDL  56.6, LDL 72. Chemistries revealed a blood sugar of 73, normal kidney  function, normal liver functions, normal electrolytes. Thyroid function  normal with a TSH of 1.81. Urinalysis was negative.   ASSESSMENT/PLAN:  1. Hyperlipidemia. Excellent response to Vytorin is noted. The patient is      tolerating this well. LDL is at goal. The patient to continue with her      present medication.  2. Health maintenance. The patient with a normal examination at today's      visit. She has a series of normal pap smears. The patient did have a      normal colonoscopy August 28, 2005. The patient did have a normal      stress Cardiolite study  May 10, 2004, with no evidence of ischemia, normal wall motion, and normal  ejection fraction. Last mammogram was August 15, 2005 and was unremarkable  and she will be scheduling her followup visit. The  patient is sent for  routine chest x-ray today. Last 12 lead electrocardiogram is from August 10, 2004 and was normal.   SUMMARY:  This is a very pleasant woman who is medically stable at this  time. She will return to see me in 1 year or on an as needed basis.                                   Rosalyn Gess Norins, MD   MEN/MedQ  DD:  06/27/2006  DT:  06/27/2006  Job #:  [id number]   cc:   Mrs. Trey Sailors

## 2011-09-03 ENCOUNTER — Ambulatory Visit (INDEPENDENT_AMBULATORY_CARE_PROVIDER_SITE_OTHER): Payer: Federal, State, Local not specified - PPO | Admitting: Internal Medicine

## 2011-09-03 ENCOUNTER — Other Ambulatory Visit (HOSPITAL_COMMUNITY)
Admission: RE | Admit: 2011-09-03 | Discharge: 2011-09-03 | Disposition: A | Discharge status: Home / Self Care | Payer: Federal, State, Local not specified - PPO | Source: Ambulatory Visit | Attending: Internal Medicine | Admitting: Internal Medicine

## 2011-09-03 DIAGNOSIS — Z01419 Encounter for gynecological examination (general) (routine) without abnormal findings: Secondary | ICD-10-CM | POA: Insufficient documentation

## 2011-09-03 DIAGNOSIS — E785 Hyperlipidemia, unspecified: Secondary | ICD-10-CM

## 2011-09-03 DIAGNOSIS — Z0000 Encounter for general adult medical examination without abnormal findings: Secondary | ICD-10-CM

## 2011-09-03 DIAGNOSIS — D126 Benign neoplasm of colon, unspecified: Secondary | ICD-10-CM

## 2011-09-03 NOTE — Progress Notes (Signed)
  Subjective:    Patient ID: Tina Aguilar, female    DOB: [birth year], 55 y.o.   MRN: [id number]  HPI Tina Aguilar presents for annual exam. In the interval she has not lost the weight she wanted to. She also had a fall, missing a bottom step at work, and she injured left ankle. She had negative radiographs but had real pain including a flare of her plantar fasciitis. She has been having chiropratic adjustments do help. Other than this she has been well.    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  HEENT:  Negative for hearing loss, ear pain, congestion, neck stiffness and postnasal drip. Negative for sore throat or swallowing problems. Negative for dental complaints.   Eyes: Negative for vision loss or change in visual acuity.  Respiratory: Negative for chest tightness and wheezing. Negative for DOE.   Cardiovascular: Negative for chest pain or palpitations. No decreased exercise tolerance Gastrointestinal: No change in bowel habit. No bloating or gas. No reflux or indigestion Genitourinary: Negative for urgency, frequency, flank pain and difficulty urinating.  Musculoskeletal: Negative for myalgias, back pain, arthralgias and gait problem.  Neurological: Negative for dizziness, tremors, weakness and headaches.    Hematological: Negative for adenopathy.  Psychiatric/Behavioral: Negative for behavioral problems and dysphoric mood.       Objective:   Physical Exam Vitals reviewed.- normal Gen'l: well nourished, well developed white woman in no distress HEENT - [place]/AT, EACs/TMs normal, oropharynx with native dentition in good condition, no buccal or palatal lesions, posterior pharynx clear, mucous membranes moist. C&S clear, PERRLA, fundi - normal Neck - supple, no thyromegaly Nodes- negative submental, cervical, supraclavicular regions Chest - no deformity, no CVAT Lungs - cleat without rales, wheezes. No increased work of breathing Breast - skin is normal, nipples w/o discharge, no fixed mass or lesion, no axillary adenopathy Cardiovascular - regular rate and rhythm, quiet precordium, no murmurs, rubs or gallops, 2+ radial, DP and PT pulses Abdomen - BS+ x 4, no HSM, no guarding or rebound or tenderness Pelvic - NEG/BUS normal, vaginal mucosa normal, thick white discharge in posterior vornix, Cx appears normal, PAP performed Rectal - deferred to recent colonoscopy Extremities - no clubbing, cyanosis, edema or deformity.  Neuro - A&O x 3, CN II-XII normal, motor strength normal and equal, DTRs 2+ and symmetrical biceps, radial, and patellar tendons. Cerebellar - no tremor, no rigidity, fluid movement and normal gait. Derm - Head, neck, back, abdomen and extremities without suspicious lesions             Assessment & Plan:

## 2011-09-04 ENCOUNTER — Other Ambulatory Visit: Payer: Self-pay | Admitting: Internal Medicine

## 2011-09-04 DIAGNOSIS — Z0000 Encounter for general adult medical examination without abnormal findings: Secondary | ICD-10-CM

## 2011-09-04 MED ORDER — FLUCONAZOLE 100 MG PO TABS: 100.0000 mg | ORAL_TABLET | Freq: Every day | ORAL | Status: AC

## 2011-09-04 MED ORDER — EZETIMIBE-SIMVASTATIN 10-20 MG PO TABS: 1.0000 | ORAL_TABLET | Freq: Every day | ORAL | Status: DC

## 2011-09-04 NOTE — Assessment & Plan Note (Signed)
Lab is pending - will adjust medications if needed to attain goal of LDL < 130

## 2011-09-04 NOTE — Assessment & Plan Note (Addendum)
Interval medical history is benign except for badly sprained left ankle. Physical exam is normal with incidental finding of vaginitis. Diflucan prescribed. Reviewed chart - she has had previous labs and all she is due for is a lipid panel and a CBC. She is current with colorectal cancer screening with last colonsocopy Feb '12. She is current with breast cancer screening with last mammogram Dec '11 and a normal breast exam today. She is current with cervical cancer screening with normal PAP in '11 and exam done today. Immunizations: she is due for Tdap at her convenience.  In summary - a very nice woman who is medically stable. She is advised to continue with a weight management program of smart food choices, portion size control and regular exercise. Until her ankle heels she should consider water exercise or other non-weight bearing exercise. She will return as needed or in 1 year.

## 2011-09-17 ENCOUNTER — Telehealth: Payer: Self-pay | Admitting: Internal Medicine

## 2011-09-17 NOTE — Telephone Encounter (Signed)
Call pt: PAP negartive

## 2011-09-25 NOTE — Telephone Encounter (Signed)
Informed pt that pap smear was normal

## 2011-09-27 ENCOUNTER — Other Ambulatory Visit (INDEPENDENT_AMBULATORY_CARE_PROVIDER_SITE_OTHER): Payer: Federal, State, Local not specified - PPO

## 2011-09-27 DIAGNOSIS — E785 Hyperlipidemia, unspecified: Secondary | ICD-10-CM

## 2011-09-27 DIAGNOSIS — D126 Benign neoplasm of colon, unspecified: Secondary | ICD-10-CM

## 2011-09-27 LAB — CBC WITH DIFFERENTIAL/PLATELET
Basophils Relative: 0.6 % (ref 0.0–3.0)
Eosinophils Relative: 1.4 % (ref 0.0–5.0)
HCT: 40.5 % (ref 36.0–46.0)
Hemoglobin: 13.7 g/dL (ref 12.0–15.0)
Lymphs Abs: 2.1 10*3/uL (ref 0.7–4.0)
MCV: 90.6 fl (ref 78.0–100.0)
Monocytes Relative: 8.9 % (ref 3.0–12.0)
Neutro Abs: 3.2 10*3/uL (ref 1.4–7.7)
Neutrophils Relative %: 53.8 % (ref 43.0–77.0)
RBC: 4.47 Mil/uL (ref 3.87–5.11)
RDW: 13.2 % (ref 11.5–14.6)
WBC: 5.9 10*3/uL (ref 4.5–10.5)

## 2011-09-27 LAB — LIPID PANEL
LDL Cholesterol: 88 mg/dL (ref 0–99)
Total CHOL/HDL Ratio: 2
Triglycerides: 40 mg/dL (ref 0.0–149.0)
VLDL: 8 mg/dL (ref 0.0–40.0)

## 2011-09-29 ENCOUNTER — Encounter: Payer: Self-pay | Admitting: Internal Medicine

## 2011-10-16 ENCOUNTER — Other Ambulatory Visit: Payer: Self-pay | Admitting: Internal Medicine

## 2011-10-16 DIAGNOSIS — Z1231 Encounter for screening mammogram for malignant neoplasm of breast: Secondary | ICD-10-CM

## 2011-10-28 ENCOUNTER — Ambulatory Visit: Payer: Federal, State, Local not specified - PPO

## 2011-10-29 ENCOUNTER — Ambulatory Visit: Payer: Federal, State, Local not specified - PPO

## 2011-11-04 ENCOUNTER — Other Ambulatory Visit: Payer: Self-pay | Admitting: Internal Medicine

## 2011-11-18 ENCOUNTER — Ambulatory Visit: Payer: Federal, State, Local not specified - PPO

## 2011-11-19 ENCOUNTER — Ambulatory Visit
Admission: RE | Admit: 2011-11-19 | Discharge: 2011-11-19 | Disposition: A | Discharge status: Home / Self Care | Payer: Federal, State, Local not specified - PPO | Source: Ambulatory Visit | Attending: Internal Medicine | Admitting: Internal Medicine

## 2011-11-19 DIAGNOSIS — Z1231 Encounter for screening mammogram for malignant neoplasm of breast: Secondary | ICD-10-CM

## 2012-09-08 ENCOUNTER — Encounter: Payer: Federal, State, Local not specified - PPO | Admitting: Internal Medicine

## 2012-09-08 DIAGNOSIS — Z0289 Encounter for other administrative examinations: Secondary | ICD-10-CM

## 2012-10-12 ENCOUNTER — Other Ambulatory Visit: Payer: Self-pay | Admitting: Internal Medicine

## 2012-10-12 DIAGNOSIS — Z1231 Encounter for screening mammogram for malignant neoplasm of breast: Secondary | ICD-10-CM

## 2012-11-20 ENCOUNTER — Ambulatory Visit: Payer: Federal, State, Local not specified - PPO

## 2012-11-23 ENCOUNTER — Ambulatory Visit
Admission: RE | Admit: 2012-11-23 | Discharge: 2012-11-23 | Disposition: A | Discharge status: Home / Self Care | Payer: Federal, State, Local not specified - PPO | Source: Ambulatory Visit | Attending: Internal Medicine | Admitting: Internal Medicine

## 2012-11-23 DIAGNOSIS — Z1231 Encounter for screening mammogram for malignant neoplasm of breast: Secondary | ICD-10-CM

## 2012-11-24 ENCOUNTER — Other Ambulatory Visit: Payer: Self-pay | Admitting: Internal Medicine

## 2012-11-25 ENCOUNTER — Encounter: Payer: Federal, State, Local not specified - PPO | Admitting: Internal Medicine

## 2012-12-16 ENCOUNTER — Encounter: Payer: Federal, State, Local not specified - PPO | Admitting: Internal Medicine

## 2013-01-05 ENCOUNTER — Encounter: Payer: Federal, State, Local not specified - PPO | Admitting: Internal Medicine

## 2013-01-05 ENCOUNTER — Encounter: Payer: Self-pay | Admitting: Internal Medicine

## 2013-01-05 DIAGNOSIS — Z0289 Encounter for other administrative examinations: Secondary | ICD-10-CM

## 2013-04-01 ENCOUNTER — Encounter: Payer: Federal, State, Local not specified - PPO | Admitting: Internal Medicine

## 2013-04-20 ENCOUNTER — Encounter: Payer: Self-pay | Admitting: Internal Medicine

## 2013-04-20 ENCOUNTER — Other Ambulatory Visit (HOSPITAL_COMMUNITY)
Admission: RE | Admit: 2013-04-20 | Discharge: 2013-04-20 | Disposition: A | Discharge status: Home / Self Care | Payer: Federal, State, Local not specified - PPO | Source: Ambulatory Visit | Attending: Internal Medicine | Admitting: Internal Medicine

## 2013-04-20 ENCOUNTER — Ambulatory Visit (INDEPENDENT_AMBULATORY_CARE_PROVIDER_SITE_OTHER): Payer: Federal, State, Local not specified - PPO | Admitting: Internal Medicine

## 2013-04-20 VITALS — BP 130/92 | HR 77 | Temp 98.0°F | Resp 12 | Ht 64.0 in | Wt 167.0 lb

## 2013-04-20 DIAGNOSIS — Z01419 Encounter for gynecological examination (general) (routine) without abnormal findings: Secondary | ICD-10-CM | POA: Insufficient documentation

## 2013-04-20 DIAGNOSIS — Z124 Encounter for screening for malignant neoplasm of cervix: Secondary | ICD-10-CM

## 2013-04-20 DIAGNOSIS — E785 Hyperlipidemia, unspecified: Secondary | ICD-10-CM

## 2013-04-20 DIAGNOSIS — M2550 Pain in unspecified joint: Secondary | ICD-10-CM

## 2013-04-20 DIAGNOSIS — Z0000 Encounter for general adult medical examination without abnormal findings: Secondary | ICD-10-CM

## 2013-04-20 NOTE — Patient Instructions (Addendum)
Sorry to be running late.  Your exam is normal.   Lab and pap results will be reported in MyChart and in my note.

## 2013-04-20 NOTE — Progress Notes (Signed)
Subjective:    Patient ID: Tina Aguilar, female    DOB: 27-Apr-1956, 57 y.o.   MRN: [id number]  HPI Tina Aguilar presents for an annual wellness exam. In the interval since her last visit she has been well. She is current with mammography, has been seeing her dentist, has had an eye exam in the last 12 months. She does try to follow a healthy diet. It is hard to find time for adequate exercise. She has no specific complaints at today's visit.  Past Medical History  Diagnosis Date  . Hyperlipidemia   . Hx of colonic polyps   . Pain in joint, site unspecified    Past Surgical History  Procedure Laterality Date  . Cesarean section      x 2  . Dilation and curettage of uterus    . Colonoscopy      08/28/2005 and 11/23/2010   Family History  Problem Relation Age of Onset  . Coronary artery disease Mother   . Congestive Heart Failure Father   . Heart attack Maternal Aunt     at age 60  . Diabetes Neg Hx   . Other Brother     coronary artery bypass graft age 67   History   Social History  . Marital Status: Married    Spouse Name: N/A    Number of Children: N/A  . Years of Education: N/A   Occupational History  . Not on file.   Social History Main Topics  . Smoking status: Never Smoker   . Smokeless tobacco: Not on file  . Alcohol Use: No  . Drug Use: No  . Sexually Active: Not on file   Other Topics Concern  . 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 VYTORIN 10-20 MG per tablet TAKE ONE TABLET BY MOUTH ONE TIME DAILY IN THE P.M.  30 tablet  2   No current facility-administered medications on file prior to visit.      Review of Systems Constitutional:  Negative for fever, chills, activity change and  unexpected weight change.  HEENT:  Negative for hearing loss, ear pain, congestion, neck stiffness and postnasal drip. Negative for sore throat or swallowing problems. Negative for dental complaints.   Eyes: Negative for vision loss or change in visual acuity.  Respiratory: Negative for chest tightness and wheezing. Negative for DOE.   Cardiovascular: Negative for chest pain or palpitations. No decreased exercise tolerance Gastrointestinal: No change in bowel habit. No bloating or gas. No reflux or indigestion Genitourinary: Negative for urgency, frequency, flank pain and difficulty urinating.  Musculoskeletal: Negative for myalgias, back pain, arthralgias and gait problem.  Neurological: Negative for dizziness, tremors, weakness and headaches.  Hematological: Negative for adenopathy.  Psychiatric/Behavioral: Negative for behavioral problems and dysphoric mood.       Objective:   Physical Exam Filed Vitals:   04/20/13 1625  BP: 130/92  Pulse: 77  Temp: 98 F (36.7 C)  Resp: 12   Wt Readings from Last 3 Encounters:  04/20/13 167 lb (75.751 kg)  09/03/11 163 lb (73.936 kg)  07/31/09 157 lb (71.215 kg)   Gen'l: well nourished, well developed white Woman in no distress  HEENT - [place]/AT, EACs/TMs normal, oropharynx with native dentition in good condition, no buccal or palatal lesions, posterior pharynx clear, mucous membranes moist. C&S clear, PERRLA, fundi - normal Neck - supple, no thyromegaly Nodes- negative submental, cervical, supraclavicular regions Chest - no deformity, no CVAT Lungs - clear without rales, wheezes. No increased work of breathing Breast - - Skin normal, nipples w/o discharge, no fixed mass or lesion, no axillary adenopathy. Cardiovascular - regular rate and rhythm, quiet precordium, no murmurs, rubs or gallops, 2+ radial, DP and PT pulses Abdomen - BS+ x 4, no HSM, no guarding or rebound or tenderness Pelvic - NEG/BUS normal, entroitus normal with somewhat pale  mucosa. Cx deviated to left, appears nulliparious (deliveries were by C-section). PAP smear with cytobrush performed. No CMT Rectal - deferred  Extremities - no clubbing, cyanosis, edema or deformity.  Neuro - A&O x 3, CN II-XII normal, motor strength normal and equal, DTRs 2+ and symmetrical biceps, radial, and patellar tendons. Cerebellar - no tremor, no rigidity, fluid movement and normal gait. Derm - Head, neck, back, abdomen and extremities without suspicious lesions        Assessment & Plan:

## 2013-04-20 NOTE — Assessment & Plan Note (Signed)
Last lipid panel 2 years ago with excellent control.  Plan Follow up lab with recommendations to follow  Myalgias/arthralgias may be due to simvastatin - may need alternative treatment

## 2013-04-20 NOTE — Assessment & Plan Note (Signed)
Interval history w/o major medical illness, surgery or injury. No change in social history. Physical exam w/ PAP is normal. Labs pending. She is current with  Colorectal cancer and breast cancer screening. Immunizations - still due Tdap.  In summary A nice woman who appears to be medically stable. Lab results will be sent under separate cover or via MyChart. She will return in 1 year or sooner as needed.

## 2013-04-20 NOTE — Assessment & Plan Note (Signed)
Mrs. Rau reports progressive arthralgias and myalgias. She is concerned about reaction to "statin" medication. She stopped medication 2 days ago and reports some improvement.  Plan 2 week holiday from Vytorin - if pain remains reduced will consider alternative statin or non-statin treatment  If no significant change will work up for rheumatologic disease.

## 2013-04-26 ENCOUNTER — Telehealth: Payer: Self-pay

## 2013-04-26 NOTE — Telephone Encounter (Signed)
Message copied by Noreene Larsson on Mon Apr 26, 2013  8:43 AM ------      Message from: Jacques Navy      Created: Sun Apr 25, 2013  6:57 PM       Please call patient - normal PAP      [place] ------

## 2013-04-26 NOTE — Telephone Encounter (Signed)
Patient notified of normal pap 

## 2013-04-27 ENCOUNTER — Encounter: Payer: Self-pay | Admitting: Internal Medicine

## 2013-04-27 ENCOUNTER — Other Ambulatory Visit (INDEPENDENT_AMBULATORY_CARE_PROVIDER_SITE_OTHER): Payer: Federal, State, Local not specified - PPO

## 2013-04-27 DIAGNOSIS — Z0000 Encounter for general adult medical examination without abnormal findings: Secondary | ICD-10-CM

## 2013-04-27 DIAGNOSIS — E785 Hyperlipidemia, unspecified: Secondary | ICD-10-CM

## 2013-04-27 LAB — CBC WITH DIFFERENTIAL/PLATELET
Basophils Relative: 0.9 % (ref 0.0–3.0)
Eosinophils Absolute: 0.1 10*3/uL (ref 0.0–0.7)
Eosinophils Relative: 2.3 % (ref 0.0–5.0)
Lymphocytes Relative: 43.7 % (ref 12.0–46.0)
Monocytes Relative: 9.9 % (ref 3.0–12.0)
Neutrophils Relative %: 43.2 % (ref 43.0–77.0)
RBC: 4.48 Mil/uL (ref 3.87–5.11)
WBC: 6.2 10*3/uL (ref 4.5–10.5)

## 2013-04-27 LAB — LIPID PANEL
HDL: 58.1 mg/dL (ref >39.00)
Triglycerides: 95 mg/dL (ref 0.0–149.0)
VLDL: 19 mg/dL (ref 0.0–40.0)

## 2013-04-27 LAB — HEPATIC FUNCTION PANEL
ALT: 29 U/L (ref 0–35)
Albumin: 3.8 g/dL (ref 3.5–5.2)
Total Protein: 7.4 g/dL (ref 6.0–8.3)

## 2013-04-27 LAB — COMPREHENSIVE METABOLIC PANEL
BUN: 23 mg/dL (ref 6–23)
CO2: 29 mEq/L (ref 19–32)
Calcium: 9.6 mg/dL (ref 8.4–10.5)
Chloride: 105 mEq/L (ref 96–112)
Creatinine, Ser: 0.7 mg/dL (ref 0.4–1.2)
GFR: 89.86 mL/min (ref >60.00)
Total Bilirubin: 0.5 mg/dL (ref 0.3–1.2)

## 2013-05-11 ENCOUNTER — Other Ambulatory Visit: Payer: Self-pay | Admitting: Internal Medicine

## 2013-08-19 ENCOUNTER — Other Ambulatory Visit: Payer: Self-pay

## 2014-01-05 ENCOUNTER — Other Ambulatory Visit: Payer: Self-pay

## 2014-01-05 DIAGNOSIS — Z1231 Encounter for screening mammogram for malignant neoplasm of breast: Secondary | ICD-10-CM

## 2014-01-24 ENCOUNTER — Ambulatory Visit
Admission: RE | Admit: 2014-01-24 | Discharge: 2014-01-24 | Disposition: A | Discharge status: Home / Self Care | Payer: Federal, State, Local not specified - PPO | Source: Ambulatory Visit

## 2014-01-24 DIAGNOSIS — Z1231 Encounter for screening mammogram for malignant neoplasm of breast: Secondary | ICD-10-CM

## 2014-01-26 ENCOUNTER — Other Ambulatory Visit: Payer: Self-pay | Admitting: Internal Medicine

## 2014-01-26 DIAGNOSIS — R928 Other abnormal and inconclusive findings on diagnostic imaging of breast: Secondary | ICD-10-CM

## 2014-01-28 ENCOUNTER — Other Ambulatory Visit: Payer: Self-pay | Admitting: Internal Medicine

## 2014-01-28 ENCOUNTER — Ambulatory Visit
Admission: RE | Admit: 2014-01-28 | Discharge: 2014-01-28 | Disposition: A | Discharge status: Home / Self Care | Payer: Federal, State, Local not specified - PPO | Source: Ambulatory Visit | Attending: Internal Medicine | Admitting: Internal Medicine

## 2014-01-28 DIAGNOSIS — R928 Other abnormal and inconclusive findings on diagnostic imaging of breast: Secondary | ICD-10-CM

## 2014-03-03 ENCOUNTER — Telehealth: Payer: Self-pay | Admitting: Internal Medicine

## 2014-03-03 MED ORDER — EZETIMIBE-SIMVASTATIN 10-20 MG PO TABS: ORAL_TABLET | ORAL | Status: DC

## 2014-03-03 NOTE — Telephone Encounter (Signed)
Pt needs a refill on Vytorin to go to [facility].  She was Dr. Linda Hedges pt and is on the list at [facility] to est with Dr. Yong Channel in Aug.  LOV 04-2013.

## 2014-03-03 NOTE — Telephone Encounter (Signed)
Done erx 

## 2014-03-04 NOTE — Telephone Encounter (Signed)
LMOM to call her pharmacy.

## 2014-05-16 ENCOUNTER — Encounter: Payer: Self-pay | Admitting: Internal Medicine

## 2014-05-23 ENCOUNTER — Encounter: Payer: Self-pay | Admitting: *Deleted

## 2014-05-25 ENCOUNTER — Ambulatory Visit (INDEPENDENT_AMBULATORY_CARE_PROVIDER_SITE_OTHER): Payer: Federal, State, Local not specified - PPO | Admitting: Internal Medicine

## 2014-05-25 ENCOUNTER — Encounter: Payer: Self-pay | Admitting: Internal Medicine

## 2014-05-25 VITALS — BP 126/78 | HR 67 | Temp 97.3°F | Resp 12 | Ht 63.08 in | Wt 166.0 lb

## 2014-05-25 DIAGNOSIS — IMO0001 Reserved for inherently not codable concepts without codable children: Secondary | ICD-10-CM

## 2014-05-25 DIAGNOSIS — Z23 Encounter for immunization: Secondary | ICD-10-CM

## 2014-05-25 DIAGNOSIS — Z0000 Encounter for general adult medical examination without abnormal findings: Secondary | ICD-10-CM | POA: Insufficient documentation

## 2014-05-25 DIAGNOSIS — E785 Hyperlipidemia, unspecified: Secondary | ICD-10-CM

## 2014-05-25 MED ORDER — ZOSTER VACCINE LIVE 19400 UNT/0.65ML ~~LOC~~ SOLR: 0.6500 mL | Freq: Once | SUBCUTANEOUS | Status: DC

## 2014-05-25 NOTE — Progress Notes (Signed)
Patient ID: Tina Aguilar, female   DOB: [birth year], 58 y.o.   MRN: [id number]    Chief complaint - establish care, annual exam  No Known Allergies  HPI 58 y/o female patient is here to establish care. She has history of hyperlipidemia and is on vytorin. She complaints of diffuse muscle aches and fatigue. No other complaints this visit. She has concerns for autoimmune disease with her mom and sister having this  Colonoscopy: 11/2010 normal, next in 10 years Mammogram: 01/28/14 fibroglandular tissue in right breast, repeat in 6 months Pap smear: 2014 normal  Review of Systems  Constitutional: Positive for weight loss. Negative for fever, chills, malaise/fatigue and diaphoresis.       Lost 13 lbs over 2 and a half months with exercise and diet- Merryl Hacker and [employer] Has hot flashes  HENT: Negative for congestion, ear discharge, ear pain, hearing loss, nosebleeds, sore throat and tinnitus.   Eyes: Negative for blurred vision, double vision, discharge and redness.       Has reading glasses  Respiratory: Negative for cough, hemoptysis, sputum production, shortness of breath and wheezing.   Cardiovascular: Negative for chest pain, palpitations, orthopnea, claudication, leg swelling and PND.  Gastrointestinal: Negative for heartburn, nausea, vomiting, abdominal pain, diarrhea, constipation, blood in stool and melena.       Colonoscopy in past has been normal.  Genitourinary: Negative for dysuria, urgency, frequency, hematuria and flank pain.       Pap smear last year was normal. Denies vaginal discharge or itching  Musculoskeletal: Positive for joint pain and myalgias. Negative for back pain, falls and neck pain.       Has had plantar fascitis in past Has joint stiffness Has frequent left shoulder pain and stiffness in her left elbow and sees chiropractor for this  Skin: Negative for itching and rash.  Neurological: Negative for dizziness, tingling, tremors, sensory change, speech change,  focal weakness, seizures, loss of consciousness, weakness and headaches.  Endo/Heme/Allergies: Negative for environmental allergies. Does not bruise/bleed easily.  Psychiatric/Behavioral: Negative for depression, suicidal ideas, hallucinations, memory loss and substance abuse. The patient is not nervous/anxious and does not have insomnia.    Past Medical History  Diagnosis Date  . Hyperlipidemia   . Hx of colonic polyps   . Pain in joint, site unspecified    Past Surgical History  Procedure Laterality Date  . Cesarean section      x 2  . Dilation and curettage of uterus    . Colonoscopy      08/28/2005 and 11/23/2010   Family History  Problem Relation Age of Onset  . Coronary artery disease Mother   . Arthritis Mother   . Congestive Heart Failure Father   . Heart attack Father   . Heart attack Maternal Aunt     at age 40  . Other Brother     coronary artery bypass graft age 43  . Hyperlipidemia Brother   . Diabetes Brother   . Hyperlipidemia Sister   . Diabetes Sister   . Heart disease Sister   . Hyperlipidemia Sister   . Hyperlipidemia Sister    History   Social History  . Marital Status: Married    Spouse Name: N/A    Number of Children: N/A  . Years of Education: N/A   Occupational History  . Not on file.   Social History Main Topics  . Smoking status: Former Smoker -- 1.00 packs/day for 5 years  . Smokeless tobacco: Not on file  Comment: Quit at age 89  . Alcohol Use: No  . Drug Use: No  . Sexual Activity: Not on file   Other Topics Concern  . 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  Modified Medications   Modified Medication Previous Medication   ZOSTER VACCINE LIVE, PF, (ZOSTAVAX) 76160 UNT/0.65ML INJECTION zoster vaccine live, PF, (ZOSTAVAX) 73710 UNT/0.65ML injection      Inject 19,400 Units into the skin once.    Inject 0.65 mLs into the skin once.  Discontinued Medications   No medications on file    Physical Exam:  Filed Vitals:   05/25/14 1149  BP: 126/78  Pulse: 67  Temp: 97.3 F (36.3 C)  TempSrc: Oral  Resp: 12  Height: 5' 3.08" (1.602 m)  Weight: 166 lb (75.297 kg)  SpO2: 96%   General- adult female in no acute distress, well nourished and well developed Head- atraumatic, normocephalic Eyes- PERRLA, EOMI, no pallor, no icterus, no discharge Neck- supple, no lymphadenopathy, no thyromegaly, no jugular vein distension, no carotid bruit Ears- left ear normal tympanic membrane and normal external ear canal , right ear normal tympanic membrane and normal external ear canal oropharynx- moist mucus membrane, normal oropharynx, normal dentition, no buccal or palatal lesions, posterior pharynx clear Nose- normal nasal mucosa, no maxillary or frontal sinus tenderness Chest- no chest wall deformities, no chest wall tenderness Breast- refused Cardiovascular- normal s1,s2, no murmurs/ rubs/ gallops, good radial and dorsalis pedis pulses Respiratory- bilateral clear to auscultation, no wheeze, no rhonchi, no crackles, no use of accessory muscles Abdomen- bowel sounds present, soft, non tender, no organomegaly, no abdominal bruits, no guarding or rigidity, no CVA tenderness Pelvic exam- refused Musculoskeletal- able to move all 4 extremities, no spinal and paraspinal tenderness, steady gait, no use of assistive device, normal range of motion, no leg edema Neurological- no focal deficit, cranial nerve II-XII normal, normal motor strength, normal deep tendon reflexes 2+ and symmetrical biceps and patellar, normal sensation to fine touch and vibration, no  tremor, no rigidity, alert and oriented to person, place and time Skin- warm and dry Psychiatry- normal mood and affect patient was admitted to the hospital on The primary goal of care is patient's comfort.   Labs reviewed: Lab Results  Component Value Date   WBC 6.2 04/27/2013   HGB 13.6 04/27/2013   HCT 40.3 04/27/2013   MCV 90.0 04/27/2013   PLT 252.0 04/27/2013   CMP     Component Value Date/Time   NA 140 04/27/2013 0758   K 4.3 04/27/2013 0758   CL 105 04/27/2013 0758   CO2 29 04/27/2013 0758   GLUCOSE 87 04/27/2013 0758   BUN 23 04/27/2013 0758   CREATININE 0.7 04/27/2013 0758   CALCIUM 9.6 04/27/2013 0758   PROT 7.4 04/27/2013 0758   PROT 7.4 04/27/2013 0758   ALBUMIN 3.8 04/27/2013 0758   ALBUMIN 3.8 04/27/2013 0758   AST 25 04/27/2013 0758   AST 25 04/27/2013 0758   ALT 29 04/27/2013 0758   ALT 29 04/27/2013 0758  ALKPHOS 62 04/27/2013 0758   ALKPHOS 62 04/27/2013 0758   BILITOT 0.5 04/27/2013 0758   BILITOT 0.5 04/27/2013 0758   GFRNONAA 98.70 08/24/2010 0837   GFRAA 96 07/08/2008 0844   No results found for this basename: HGBA1C   Lipid Panel     Component Value Date/Time   CHOL 217* 04/27/2013 0758   TRIG 95.0 04/27/2013 0758   HDL 58.10 04/27/2013 0758   CHOLHDL 4 04/27/2013 0758   VLDL 19.0 04/27/2013 0758   LDLCALC 88 09/27/2011 0759   Lab Results  Component Value Date   TSH 1.59 08/24/2010    Assessment/Plan  1. Other and unspecified hyperlipidemia Recheck lipid panel. Reviewed prior lipid profile. Will hold off vytorin for now and see if this helps with her muscle aches. If it does, consider different group of statin or other lipid lowering agent. Advised to take baby aspirin on daily basis.  - Lipid Panel; Future  2. Myalgia and myositis Possible statin induced myopath. Check ck and esr today. Will hold vytorin for now. If no improvement in muscle aches, will assess for PMR with ESR check today. If no improvement consider Rheumatologic workup. Also assess for  thyroid abnormality and vit d def - CMP; Future - CBC with Differential; Future - TSH; Future - Vitamin D, 1,25-dihydroxy; Future - Sedimentation Rate; Future - CK; Future  3. Need for Tdap vaccination - Tdap vaccine greater than or equal to 7yo IM  4.v70.0 the patient was counseled regarding the appropriate use of alcohol, regular self-examination of the breasts on a monthly basis, prevention of dental and periodontal disease, diet, regular sustained exercise for at least 30 minutes 5 times per week, routine screening interval for mammogram as recommended by the [place] and ACOG, the proper use of sunscreen and protective clothing, tobacco use, and recommended schedule for GI hemoccult testing, colonoscopy, cholesterol, thyroid and diabetes screening.  Labs/tests ordered: cbc, cmp, lipid, tsh, vit d, esr, ck    Blanchie Serve, MD  [facility] [phone] (Monday-Friday 8 am - 5 pm) [phone] (afterhours)

## 2014-05-25 NOTE — Patient Instructions (Addendum)
Stop taking your vytorin for now and if you have no improvement of muscle aches in 1-2 weeks, please schedule appointment to see me.  Start taking baby aspirin daily

## 2014-05-26 ENCOUNTER — Other Ambulatory Visit: Payer: Federal, State, Local not specified - PPO

## 2014-05-30 ENCOUNTER — Other Ambulatory Visit: Payer: Federal, State, Local not specified - PPO

## 2014-05-30 DIAGNOSIS — IMO0001 Reserved for inherently not codable concepts without codable children: Secondary | ICD-10-CM

## 2014-05-30 DIAGNOSIS — E785 Hyperlipidemia, unspecified: Secondary | ICD-10-CM

## 2014-05-31 LAB — CBC WITH DIFFERENTIAL/PLATELET
BASOS: 1 %
Basophils Absolute: 0 10*3/uL (ref 0.0–0.2)
EOS ABS: 0.2 10*3/uL (ref 0.0–0.4)
Eos: 3 %
HCT: 38.2 % (ref 34.0–46.6)
HEMOGLOBIN: 13.1 g/dL (ref 11.1–15.9)
Immature Grans (Abs): 0 10*3/uL (ref 0.0–0.1)
Immature Granulocytes: 0 %
Lymphocytes Absolute: 1.9 10*3/uL (ref 0.7–3.1)
Lymphs: 32 %
MCH: 30 pg (ref 26.6–33.0)
MCHC: 34.3 g/dL (ref 31.5–35.7)
MCV: 88 fL (ref 79–97)
MONOS ABS: 0.5 10*3/uL (ref 0.1–0.9)
Monocytes: 9 %
NEUTROS ABS: 3.3 10*3/uL (ref 1.4–7.0)
Neutrophils Relative %: 55 %
RBC: 4.36 x10E6/uL (ref 3.77–5.28)
RDW: 13.8 % (ref 12.3–15.4)
WBC: 6 10*3/uL (ref 3.4–10.8)

## 2014-05-31 LAB — SEDIMENTATION RATE: SED RATE: 11 mm/h (ref 0–40)

## 2014-05-31 LAB — COMPREHENSIVE METABOLIC PANEL
ALK PHOS: 66 IU/L (ref 39–117)
ALT: 30 IU/L (ref 0–32)
AST: 25 IU/L (ref 0–40)
Albumin/Globulin Ratio: 1.7 (ref 1.1–2.5)
Albumin: 4.4 g/dL (ref 3.5–5.5)
BILIRUBIN TOTAL: 0.2 mg/dL (ref 0.0–1.2)
BUN/Creatinine Ratio: 26 — ABNORMAL HIGH (ref 9–23)
BUN: 18 mg/dL (ref 6–24)
CO2: 22 mmol/L (ref 18–29)
Calcium: 9.4 mg/dL (ref 8.7–10.2)
Chloride: 104 mmol/L (ref 97–108)
Creatinine, Ser: 0.7 mg/dL (ref 0.57–1.00)
GFR calc non Af Amer: 95 mL/min/{1.73_m2} (ref >59)
GFR, EST AFRICAN AMERICAN: 110 mL/min/{1.73_m2} (ref >59)
Globulin, Total: 2.6 g/dL (ref 1.5–4.5)
Glucose: 80 mg/dL (ref 65–99)
POTASSIUM: 4.9 mmol/L (ref 3.5–5.2)
Sodium: 140 mmol/L (ref 134–144)
Total Protein: 7 g/dL (ref 6.0–8.5)

## 2014-05-31 LAB — LIPID PANEL
CHOLESTEROL TOTAL: 186 mg/dL (ref 100–199)
Chol/HDL Ratio: 3 ratio units (ref 0.0–4.4)
HDL: 62 mg/dL (ref >39)
LDL Calculated: 106 mg/dL — ABNORMAL HIGH (ref 0–99)
TRIGLYCERIDES: 92 mg/dL (ref 0–149)
VLDL Cholesterol Cal: 18 mg/dL (ref 5–40)

## 2014-05-31 LAB — VITAMIN D 1,25 DIHYDROXY: VIT D 1 25 DIHYDROXY: 54.3 pg/mL (ref 19.9–79.3)

## 2014-05-31 LAB — TSH: TSH: 1.6 u[IU]/mL (ref 0.450–4.500)

## 2014-05-31 LAB — CK: Total CK: 48 U/L (ref 24–173)

## 2014-06-01 ENCOUNTER — Encounter: Payer: Self-pay | Admitting: *Deleted

## 2014-07-05 ENCOUNTER — Other Ambulatory Visit: Payer: Self-pay | Admitting: Internal Medicine

## 2014-07-05 DIAGNOSIS — N63 Unspecified lump in unspecified breast: Secondary | ICD-10-CM

## 2014-07-14 ENCOUNTER — Other Ambulatory Visit: Payer: Self-pay

## 2014-07-14 ENCOUNTER — Other Ambulatory Visit: Payer: Self-pay | Admitting: Internal Medicine

## 2014-07-14 DIAGNOSIS — N63 Unspecified lump in unspecified breast: Secondary | ICD-10-CM

## 2014-07-28 ENCOUNTER — Ambulatory Visit
Admission: RE | Admit: 2014-07-28 | Discharge: 2014-07-28 | Disposition: A | Discharge status: Home / Self Care | Payer: Federal, State, Local not specified - PPO | Source: Ambulatory Visit | Attending: Internal Medicine | Admitting: Internal Medicine

## 2014-07-28 DIAGNOSIS — N63 Unspecified lump in unspecified breast: Secondary | ICD-10-CM

## 2014-10-21 ENCOUNTER — Encounter: Payer: Self-pay | Admitting: Internal Medicine

## 2014-10-21 ENCOUNTER — Ambulatory Visit (INDEPENDENT_AMBULATORY_CARE_PROVIDER_SITE_OTHER): Payer: Federal, State, Local not specified - PPO | Admitting: Internal Medicine

## 2014-10-21 VITALS — BP 136/80 | HR 76 | Temp 97.2°F | Resp 18 | Ht 63.0 in | Wt 156.6 lb

## 2014-10-21 DIAGNOSIS — M25522 Pain in left elbow: Secondary | ICD-10-CM

## 2014-10-21 DIAGNOSIS — R200 Anesthesia of skin: Secondary | ICD-10-CM

## 2014-10-21 DIAGNOSIS — M25512 Pain in left shoulder: Secondary | ICD-10-CM

## 2014-10-21 DIAGNOSIS — R202 Paresthesia of skin: Secondary | ICD-10-CM

## 2014-10-21 NOTE — Patient Instructions (Signed)
May take Ibuprofen or Advil as needed for pain.  May use warm epsom salt soaks/compresses as needed  No heavy lifting >10 lbs until seen by Ortho

## 2014-10-21 NOTE — Progress Notes (Signed)
Patient ID: Tina Aguilar, female   DOB: 12/15/1955, 59 y.o.   MRN: [id number]    Facility  [facility]    Place of Service:   OFFICE   No Known Allergies  Chief Complaint  Patient presents with  . Acute Visit    left shoulder pain with numbness in thumb    HPI:  59 yo female seen today for an acute visit. She c/o LUE pain with associated numbness/tingling in wrist and thumb x 6 mos. She would like to see a specialist. Her job requires frequent typing and repetitive motions of UE. 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  No known injury. Occasional weakness especially with flexion of shoulder joint. She has felt spasms in bicep. She saw a chiropractor and was dx with impinged muscle but no rotator cuff tears. No CP, SOB but has occasional palpitations. Increased stressors at work over the last several mos. She is a Pharmacist, hospital  Medications: Patient's Medications  New Prescriptions   No medications on file  Previous Medications   ZOSTER VACCINE LIVE, PF, (ZOSTAVAX) 40981 UNT/0.65ML INJECTION    Inject 19,400 Units into the skin once.  Modified Medications   No medications on file  Discontinued Medications   No medications on file     Review of Systems   As above. All other systems reviewed are negative.  Filed Vitals:   10/21/14 0821  BP: 136/80  Pulse: 76  Temp: 97.2 F (36.2 C)  TempSrc: Oral  Resp: 18  Height: 5\' 3"  (1.6 m)  Weight: 156 lb 9.6 oz (71.033 kg)  SpO2: 96%   Body mass index is 27.75 kg/(m^2).  Physical Exam CONSTITUTIONAL: Looks well in NAD. Awake, alert and oriented x 3 MUSC: FROM right shoulder with minimum crepitus and no swelling; left shoulder AC joint swelling, coracoid process TTP and hypertrophy of rotator cuff muscles; left medial epicondyle swelling but no TTP; FROM of fingers; (+) palpable ulnar/radial pulses; strength 4/5 in left biceps but otherwise intact. Neg Tinel's sign. (+) Apley Scratch test on left   Labs reviewed: No  visits with results within 3 Month(s) from this visit. Latest known visit with results is:  Appointment on 05/30/2014  Component Date Value Ref Range Status  . Glucose 05/30/2014 80  65 - 99 mg/dL Final  . BUN 05/30/2014 18  6 - 24 mg/dL Final  . Creatinine, Ser 05/30/2014 0.70  0.57 - 1.00 mg/dL Final  . GFR calc non Af Amer 05/30/2014 95  >59 mL/min/1.73 Final  . GFR calc Af Amer 05/30/2014 110  >59 mL/min/1.73 Final  . BUN/Creatinine Ratio 05/30/2014 26* 9 - 23 Final  . Sodium 05/30/2014 140  134 - 144 mmol/L Final  . Potassium 05/30/2014 4.9  3.5 - 5.2 mmol/L Final  . Chloride 05/30/2014 104  97 - 108 mmol/L Final  . CO2 05/30/2014 22  18 - 29 mmol/L Final  . Calcium 05/30/2014 9.4  8.7 - 10.2 mg/dL Final  . Total Protein 05/30/2014 7.0  6.0 - 8.5 g/dL Final  . Albumin 05/30/2014 4.4  3.5 - 5.5 g/dL Final  . Globulin, Total 05/30/2014 2.6  1.5 - 4.5 g/dL Final  . Albumin/Globulin Ratio 05/30/2014 1.7  1.1 - 2.5 Final  . Total Bilirubin 05/30/2014 0.2  0.0 - 1.2 mg/dL Final  . Alkaline Phosphatase 05/30/2014 66  39 - 117 IU/L Final  . AST 05/30/2014 25  0 - 40 IU/L Final  . ALT 05/30/2014 30  0 -  32 IU/L Final  . Cholesterol, Total 05/30/2014 186  100 - 199 mg/dL Final  . Triglycerides 05/30/2014 92  0 - 149 mg/dL Final  . HDL 05/30/2014 62  >39 mg/dL Final   Comment: According to ATP-III Guidelines, HDL-C >59 mg/dL is considered a                          negative risk factor for CHD.  Marland Kitchen VLDL Cholesterol Cal 05/30/2014 18  5 - 40 mg/dL Final  . LDL Calculated 05/30/2014 106* 0 - 99 mg/dL Final  . Chol/HDL Ratio 05/30/2014 3.0  0.0 - 4.4 ratio units Final   Comment:                                   T. Chol/HDL Ratio                                                                      Men  Women                                                        1/2 Avg.Risk  3.4    3.3                                                            Avg.Risk  5.0    4.4                                                          2X Avg.Risk  9.6    7.1                                                         3X Avg.Risk 23.4   11.0  . WBC 05/30/2014 6.0  3.4 - 10.8 x10E3/uL Final  . RBC 05/30/2014 4.36  3.77 - 5.28 x10E6/uL Final  . Hemoglobin 05/30/2014 13.1  11.1 - 15.9 g/dL Final  . HCT 05/30/2014 38.2  34.0 - 46.6 % Final  . MCV 05/30/2014 88  79 - 97 fL Final  . MCH 05/30/2014 30.0  26.6 - 33.0 pg Final  . MCHC 05/30/2014 34.3  31.5 - 35.7 g/dL Final  . RDW 05/30/2014 13.8  12.3 - 15.4 % Final  . Neutrophils Relative % 05/30/2014 55   Final  . Lymphs 05/30/2014 32   Final  . Monocytes 05/30/2014 9   Final  . Eos  05/30/2014 3   Final  . Basos 05/30/2014 1   Final  . Neutrophils Absolute 05/30/2014 3.3  1.4 - 7.0 x10E3/uL Final  . Lymphocytes Absolute 05/30/2014 1.9  0.7 - 3.1 x10E3/uL Final  . Monocytes Absolute 05/30/2014 0.5  0.1 - 0.9 x10E3/uL Final  . Eosinophils Absolute 05/30/2014 0.2  0.0 - 0.4 x10E3/uL Final  . Basophils Absolute 05/30/2014 0.0  0.0 - 0.2 x10E3/uL Final  . Immature Granulocytes 05/30/2014 0   Final  . Immature Grans (Abs) 05/30/2014 0.0  0.0 - 0.1 x10E3/uL Final  . TSH 05/30/2014 1.600  0.450 - 4.500 uIU/mL Final  . Vit D, 1,25-Dihydroxy 05/30/2014 54.3  19.9 - 79.3 pg/mL Final  . Sed Rate 05/30/2014 11  0 - 40 mm/hr Final  . Total CK 05/30/2014 48  24 - 173 U/L Final     Assessment/Plan     ICD-9-CM ICD-10-CM   1. Left shoulder pain 719.41 M25.512 Ambulatory referral to Orthopedic Surgery  2. Left elbow pain 719.42 M25.522 Ambulatory referral to Orthopedic Surgery  3. Numbness and tingling of left thumb 782.0 R20.2 Ambulatory referral to Orthopedic Surgery    She has possible rotator cuff injury with probable ulnar nerve entrapment and shoulder arthrtits. Refer to Ortho for eval. May take OTC Advil prn pain. May soak in warm epsom salt prn.  F/u for CPE this summer or prn

## 2014-12-06 ENCOUNTER — Encounter: Payer: Self-pay | Admitting: Internal Medicine

## 2015-01-09 ENCOUNTER — Other Ambulatory Visit: Payer: Self-pay

## 2015-01-09 DIAGNOSIS — Z1231 Encounter for screening mammogram for malignant neoplasm of breast: Secondary | ICD-10-CM

## 2015-01-26 ENCOUNTER — Ambulatory Visit
Admission: RE | Admit: 2015-01-26 | Discharge: 2015-01-26 | Disposition: A | Discharge status: Home / Self Care | Payer: Federal, State, Local not specified - PPO | Source: Ambulatory Visit

## 2015-01-26 DIAGNOSIS — Z1231 Encounter for screening mammogram for malignant neoplasm of breast: Secondary | ICD-10-CM

## 2015-05-30 ENCOUNTER — Encounter: Payer: Federal, State, Local not specified - PPO | Admitting: Internal Medicine

## 2015-05-31 ENCOUNTER — Ambulatory Visit (INDEPENDENT_AMBULATORY_CARE_PROVIDER_SITE_OTHER): Payer: Federal, State, Local not specified - PPO | Admitting: Internal Medicine

## 2015-05-31 ENCOUNTER — Encounter: Payer: Self-pay | Admitting: Internal Medicine

## 2015-05-31 VITALS — BP 120/86 | HR 94 | Temp 97.9°F | Resp 18 | Ht 63.0 in | Wt 163.0 lb

## 2015-05-31 DIAGNOSIS — Z0000 Encounter for general adult medical examination without abnormal findings: Secondary | ICD-10-CM | POA: Diagnosis not present

## 2015-05-31 DIAGNOSIS — E785 Hyperlipidemia, unspecified: Secondary | ICD-10-CM | POA: Diagnosis not present

## 2015-05-31 DIAGNOSIS — Z124 Encounter for screening for malignant neoplasm of cervix: Secondary | ICD-10-CM

## 2015-05-31 DIAGNOSIS — M609 Myositis, unspecified: Secondary | ICD-10-CM

## 2015-05-31 DIAGNOSIS — M791 Myalgia: Secondary | ICD-10-CM | POA: Diagnosis not present

## 2015-05-31 DIAGNOSIS — N952 Postmenopausal atrophic vaginitis: Secondary | ICD-10-CM

## 2015-05-31 DIAGNOSIS — M25512 Pain in left shoulder: Secondary | ICD-10-CM | POA: Diagnosis not present

## 2015-05-31 DIAGNOSIS — IMO0001 Reserved for inherently not codable concepts without codable children: Secondary | ICD-10-CM

## 2015-05-31 NOTE — Progress Notes (Addendum)
Patient ID: Tina Aguilar, female   DOB: 24-Feb-1956, 59 y.o.   MRN: [id number] Subjective:     Tina Aguilar is a 59 y.o. female and is here for a comprehensive physical exam. The patient reports no problems. She still has left shoulder pain. She saw Ortho and rec'd steroid injection for bursitis and tendonitis. She continues to see chiropractor and does yoga which helps. ROM improved and occasional discomfort.   She reports feeling well overall. She is not sexually active. She does request pap smear today.   She has not taken vytorin in 1 yr  No falls in the last year  She does not feel depressed. 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 Currently interviewing for part-time job  Past Medical History  Diagnosis Date  . Hyperlipidemia   . Hx of colonic polyps   . Pain in joint, site unspecified    Past Surgical History  Procedure Laterality Date  . Cesarean section      x 2  . Dilation and curettage of uterus    . Colonoscopy      08/28/2005 and 11/23/2010   Family History  Problem Relation Age of Onset  . Coronary artery disease Mother   . Arthritis Mother   . Congestive Heart Failure Father   . Heart attack Father   . Heart attack Maternal Aunt     at age 11  . Other Brother     coronary artery bypass graft age 28  . Hyperlipidemia Brother   . Diabetes Brother   . Hyperlipidemia Sister   . Diabetes Sister   . Heart disease Sister   . Hyperlipidemia Sister   . Hyperlipidemia Sister     Social History   Social History  . Marital Status: Married    Spouse Name: N/A  . Number of Children: N/A  . Years of Education: N/A   Occupational History  . Not on file.   Social History Main Topics  . Smoking status: Former Smoker -- 1.00 packs/day for 5 years  . Smokeless tobacco: Never Used     Comment: Quit at age 9  . Alcohol Use: No  . Drug Use: No  . Sexual Activity: Not on file   Other Topics Concern  . 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 Hepatitis C Screening  [birth year]  . HIV Screening  04/25/1970  . ZOSTAVAX  04/26/2015  . INFLUENZA VACCINE  05/15/2015  . PAP SMEAR  04/20/2016  . MAMMOGRAM  01/25/2017  . COLONOSCOPY  11/23/2020  . TETANUS/TDAP  05/25/2024    Review of Systems   Review of Systems  Constitutional: Negative for fever, chills and malaise/fatigue.  HENT: Negative for sore throat and tinnitus.   Eyes: Negative for blurred vision and double vision.  Respiratory: Negative for cough, shortness of breath and wheezing.   Cardiovascular: Negative for chest pain, palpitations, orthopnea and leg swelling.  Gastrointestinal: Negative for heartburn, nausea, vomiting, abdominal pain, diarrhea, constipation and blood in stool.  Genitourinary: Negative for dysuria, urgency, frequency and hematuria.  Musculoskeletal: Positive for joint pain. Negative for myalgias and falls.  Skin: Negative for rash.  Neurological: Negative for dizziness, tingling, tremors, sensory change, focal weakness, seizures, loss of  consciousness, weakness and headaches.  Endo/Heme/Allergies: Negative for environmental allergies. Does not bruise/bleed easily.  Psychiatric/Behavioral: Negative for depression and memory loss. The patient is not nervous/anxious and does not have insomnia.      Objective:      Physical Exam  Constitutional: She is oriented to person, place, and time and well-developed, well-nourished, and in no distress.  HENT:  Head: Normocephalic and atraumatic.  Right Ear: External ear normal.  Left Ear: External ear normal.  Mouth/Throat: Oropharynx is clear and moist. No oropharyngeal exudate.  Eyes: Conjunctivae and EOM are normal. Pupils are equal, round, and reactive to light. No scleral icterus.  Neck: Normal range of motion.  Neck supple. Carotid bruit is not present. No tracheal deviation present. No thyromegaly present.  Cardiovascular: Normal rate, regular rhythm, normal heart sounds and intact distal pulses.  Exam reveals no gallop and no friction rub.   No murmur heard. Pulmonary/Chest: Effort normal and breath sounds normal. She has no wheezes. She has no rhonchi. She has no rales. She exhibits no tenderness. Right breast exhibits no inverted nipple, no mass, no nipple discharge, no skin change and no tenderness. Left breast exhibits no inverted nipple, no mass, no nipple discharge, no skin change and no tenderness. Breasts are symmetrical.  Abdominal: Soft. Bowel sounds are normal. She exhibits no distension and no mass. There is no hepatosplenomegaly. There is no tenderness. There is no rebound and no guarding.  Genitourinary: Uterus normal, cervix normal, right adnexa normal, left adnexa normal and vulva normal. Rectal exam shows external hemorrhoid (nonbleeding). Rectal exam shows no internal hemorrhoid, no fissure, no laceration, no mass and no tenderness. Guaiac negative stool. Vagina exhibits abnormal mucosa (atrophic appearing). No vaginal discharge found.  Musculoskeletal: She exhibits edema and tenderness.       Left shoulder: She exhibits decreased range of motion, tenderness, swelling and crepitus. She exhibits no deformity.  No kyphosis.   Lymphadenopathy:    She has no cervical adenopathy.  Neurological: She is alert and oriented to person, place, and time. She has normal reflexes. Gait normal.  Skin: Skin is warm and dry. No rash noted.  Psychiatric: Mood, memory, affect and judgment normal.   ECG REVIEWED BY MYSELF: NSR @ 78 bpm, nml axis. No acute ischemic changes. No other ECG available to compare   Assessment:    Healthy female exam.       ICD-9-CM ICD-10-CM   1. Annual physical exam V70.0 Z00.00 EKG 12-Lead     CBC with Differential     CMP     Lipid Panel     TSH  2. Cervical cancer  screening V76.2 Z12.4 PAP, Image Guided [[facility]]     PAP, Image Guided [[facility], [facility]]  3. Hyperlipemia 272.4 E78.5 EKG 12-Lead     Lipid Panel     TSH  4. Left shoulder pain 719.41 M25.512   5. Myalgia and myositis 729.1 M79.1     M60.9   6. Postmenopausal atrophic vaginitis 627.3 N95.2     Plan:     See After Visit Summary for Counseling Recommendations    Pt is UTD on health maintenance. Vaccinations are UTD. Pt maintains a healthy lifestyle. Encouraged pt to exercise 30-45 minutes 4-5 times per week. Eat a well balanced diet. Avoid smoking. Limit alcohol intake. Wear seatbelt when riding in the car. Wear sun block (SPF >50) when spending extended times outside.  Continue yoga, Curves and Rickard Patience plan.  Follow up with chiropractor as scheduled  Follow up in 1 year for CPE and as needed. Follow up this fall for annual flu vaccine. Pneumovax due at age 28  [place]. Perlie Gold  [facility] [address] [place], [place] [address] [phone] Cell (Monday-Friday 8 AM - 5 PM) [phone] After 5 PM and follow prompts

## 2015-05-31 NOTE — Patient Instructions (Signed)
Encouraged her to exercise 30-45 minutes 4-5 times per week. Eat a well balanced diet. Avoid smoking. Limit alcohol intake. Wear seatbelt when riding in the car. Wear sun block (SPF >50) when spending extended times outside.  Continue yoga, Curves and Rickard Patience plan.  Follow up with chiropractor as scheduled  Follow up in 1 year for CPE and as needed. Follow up this fall for annual flu vaccine. Pneumovax due at age 59

## 2015-06-01 LAB — CBC WITH DIFFERENTIAL/PLATELET
BASOS: 1 %
Basophils Absolute: 0 10*3/uL (ref 0.0–0.2)
EOS (ABSOLUTE): 0 10*3/uL (ref 0.0–0.4)
Eos: 1 %
Hematocrit: 37.7 % (ref 34.0–46.6)
Hemoglobin: 13 g/dL (ref 11.1–15.9)
IMMATURE GRANS (ABS): 0 10*3/uL (ref 0.0–0.1)
IMMATURE GRANULOCYTES: 0 %
LYMPHS: 35 %
Lymphocytes Absolute: 1.8 10*3/uL (ref 0.7–3.1)
MCH: 30 pg (ref 26.6–33.0)
MCHC: 34.5 g/dL (ref 31.5–35.7)
MCV: 87 fL (ref 79–97)
Monocytes Absolute: 0.5 10*3/uL (ref 0.1–0.9)
Monocytes: 9 %
NEUTROS PCT: 54 %
Neutrophils Absolute: 2.8 10*3/uL (ref 1.4–7.0)
PLATELETS: 325 10*3/uL (ref 150–379)
RBC: 4.33 x10E6/uL (ref 3.77–5.28)
RDW: 13.5 % (ref 12.3–15.4)
WBC: 5.1 10*3/uL (ref 3.4–10.8)

## 2015-06-01 LAB — COMPREHENSIVE METABOLIC PANEL
A/G RATIO: 1.5 (ref 1.1–2.5)
ALT: 19 IU/L (ref 0–32)
AST: 17 IU/L (ref 0–40)
Albumin: 4.4 g/dL (ref 3.6–4.8)
Alkaline Phosphatase: 75 IU/L (ref 39–117)
BUN/Creatinine Ratio: 23 (ref 11–26)
BUN: 17 mg/dL (ref 8–27)
Bilirubin Total: 0.4 mg/dL (ref 0.0–1.2)
CALCIUM: 10 mg/dL (ref 8.7–10.3)
CO2: 25 mmol/L (ref 18–29)
CREATININE: 0.74 mg/dL (ref 0.57–1.00)
Chloride: 100 mmol/L (ref 97–108)
GFR, EST AFRICAN AMERICAN: 102 mL/min/{1.73_m2} (ref >59)
GFR, EST NON AFRICAN AMERICAN: 88 mL/min/{1.73_m2} (ref >59)
Globulin, Total: 2.9 g/dL (ref 1.5–4.5)
Glucose: 90 mg/dL (ref 65–99)
Potassium: 4.5 mmol/L (ref 3.5–5.2)
Sodium: 141 mmol/L (ref 134–144)
TOTAL PROTEIN: 7.3 g/dL (ref 6.0–8.5)

## 2015-06-01 LAB — LIPID PANEL
CHOL/HDL RATIO: 3.5 ratio (ref 0.0–4.4)
Cholesterol, Total: 266 mg/dL — ABNORMAL HIGH (ref 100–199)
HDL: 75 mg/dL (ref >39)
LDL CALC: 179 mg/dL — AB (ref 0–99)
TRIGLYCERIDES: 62 mg/dL (ref 0–149)
VLDL CHOLESTEROL CAL: 12 mg/dL (ref 5–40)

## 2015-06-01 LAB — TSH: TSH: 1.55 u[IU]/mL (ref 0.450–4.500)

## 2015-06-02 LAB — PAP IG (IMAGE GUIDED): PAP Smear Comment: 0

## 2015-06-05 ENCOUNTER — Other Ambulatory Visit: Payer: Self-pay

## 2015-06-05 MED ORDER — EZETIMIBE-SIMVASTATIN 10-20 MG PO TABS: ORAL_TABLET | ORAL | Status: DC

## 2015-06-05 NOTE — Telephone Encounter (Signed)
Spoke with Dr. Eulas Post she okayed the vytorin  prescription to be ordered as it was  before. Called patient and informed her that the prescription was sent to her pharmacy at [facility] in [place].

## 2015-06-06 ENCOUNTER — Telehealth: Payer: Self-pay

## 2015-06-06 NOTE — Telephone Encounter (Signed)
error 

## 2015-06-08 ENCOUNTER — Telehealth: Payer: Self-pay

## 2015-06-08 DIAGNOSIS — E785 Hyperlipidemia, unspecified: Secondary | ICD-10-CM

## 2015-06-08 NOTE — Telephone Encounter (Signed)
Patient called stating that Vytorin is too expensive and she needs an alternative. Patient states her cholesterol issues are part hereditary and part diet related.  Pharmacy on file verified  Please advise

## 2015-06-08 NOTE — Telephone Encounter (Signed)
Simvastatin 20mg  #30 1 tab po qhs with 6RF; fenofibrate 130mg  #30 take 1 tab po daily with 6RF

## 2015-06-09 NOTE — Telephone Encounter (Signed)
Left message on voicemail for patient to return call when available   

## 2015-06-12 NOTE — Telephone Encounter (Signed)
Patient called and left message on voicemail that no one has returned her call from Thursday. I called and left message that someone had tried to return her call and left message to call back and for her to return call.

## 2015-06-13 MED ORDER — FENOFIBRATE MICRONIZED 130 MG PO CAPS: 130.0000 mg | ORAL_CAPSULE | Freq: Every day | ORAL | Status: DC

## 2015-06-13 MED ORDER — SIMVASTATIN 20 MG PO TABS: 20.0000 mg | ORAL_TABLET | Freq: Every day | ORAL | Status: DC

## 2015-06-13 NOTE — Telephone Encounter (Signed)
Left message on voicemail for patient to return call when available   

## 2015-06-13 NOTE — Telephone Encounter (Signed)
Fasting lipid panel and ALT in 1 month

## 2015-06-13 NOTE — Telephone Encounter (Signed)
Patient aware YK'D sent in. Patient would like to know when should she have labs rechecked to confirm that medications are working

## 2015-06-28 NOTE — Telephone Encounter (Signed)
Patient already has pending appointment.

## 2015-10-15 ENCOUNTER — Other Ambulatory Visit: Payer: Self-pay | Admitting: Nurse Practitioner

## 2015-10-15 ENCOUNTER — Other Ambulatory Visit: Payer: Self-pay | Admitting: Internal Medicine

## 2015-11-23 ENCOUNTER — Other Ambulatory Visit: Payer: Self-pay | Admitting: Nurse Practitioner

## 2016-01-05 ENCOUNTER — Other Ambulatory Visit: Payer: Self-pay

## 2016-01-05 DIAGNOSIS — Z1231 Encounter for screening mammogram for malignant neoplasm of breast: Secondary | ICD-10-CM

## 2016-01-29 ENCOUNTER — Ambulatory Visit: Payer: Federal, State, Local not specified - PPO

## 2016-01-29 ENCOUNTER — Encounter: Payer: Self-pay | Admitting: Internal Medicine

## 2016-02-22 ENCOUNTER — Ambulatory Visit
Admission: RE | Admit: 2016-02-22 | Discharge: 2016-02-22 | Disposition: A | Discharge status: Home / Self Care | Payer: Federal, State, Local not specified - PPO | Source: Ambulatory Visit

## 2016-02-22 DIAGNOSIS — Z1231 Encounter for screening mammogram for malignant neoplasm of breast: Secondary | ICD-10-CM

## 2016-04-05 ENCOUNTER — Other Ambulatory Visit: Payer: Self-pay | Admitting: Internal Medicine

## 2016-04-15 ENCOUNTER — Other Ambulatory Visit: Payer: Self-pay | Admitting: Internal Medicine

## 2016-05-31 ENCOUNTER — Encounter: Payer: Federal, State, Local not specified - PPO | Admitting: Internal Medicine

## 2016-06-07 ENCOUNTER — Encounter: Payer: Federal, State, Local not specified - PPO | Admitting: Internal Medicine

## 2016-07-17 ENCOUNTER — Other Ambulatory Visit: Payer: Self-pay | Admitting: Internal Medicine

## 2016-08-16 ENCOUNTER — Encounter: Payer: Self-pay | Admitting: Internal Medicine

## 2016-08-16 ENCOUNTER — Ambulatory Visit (INDEPENDENT_AMBULATORY_CARE_PROVIDER_SITE_OTHER): Payer: Federal, State, Local not specified - PPO | Admitting: Internal Medicine

## 2016-08-16 VITALS — BP 138/88 | HR 71 | Temp 97.7°F | Ht 63.39 in | Wt 167.0 lb

## 2016-08-16 DIAGNOSIS — N6320 Unspecified lump in the left breast, unspecified quadrant: Secondary | ICD-10-CM | POA: Diagnosis not present

## 2016-08-16 DIAGNOSIS — Z1211 Encounter for screening for malignant neoplasm of colon: Secondary | ICD-10-CM | POA: Diagnosis not present

## 2016-08-16 DIAGNOSIS — Z0000 Encounter for general adult medical examination without abnormal findings: Secondary | ICD-10-CM | POA: Diagnosis not present

## 2016-08-16 DIAGNOSIS — E782 Mixed hyperlipidemia: Secondary | ICD-10-CM

## 2016-08-16 DIAGNOSIS — N644 Mastodynia: Secondary | ICD-10-CM | POA: Diagnosis not present

## 2016-08-16 DIAGNOSIS — L989 Disorder of the skin and subcutaneous tissue, unspecified: Secondary | ICD-10-CM | POA: Diagnosis not present

## 2016-08-16 LAB — CBC WITH DIFFERENTIAL/PLATELET
BASOS PCT: 1 %
Basophils Absolute: 62 cells/uL (ref 0–200)
Eosinophils Absolute: 62 cells/uL (ref 15–500)
Eosinophils Relative: 1 %
HCT: 41.1 % (ref 35.0–45.0)
HEMOGLOBIN: 13.5 g/dL (ref 11.7–15.5)
LYMPHS ABS: 2294 {cells}/uL (ref 850–3900)
Lymphocytes Relative: 37 %
MCH: 29.7 pg (ref 27.0–33.0)
MCHC: 32.8 g/dL (ref 32.0–36.0)
MCV: 90.3 fL (ref 80.0–100.0)
MONOS PCT: 7 %
MPV: 10.6 fL (ref 7.5–12.5)
Monocytes Absolute: 434 cells/uL (ref 200–950)
Neutro Abs: 3348 cells/uL (ref 1500–7800)
Neutrophils Relative %: 54 %
PLATELETS: 322 10*3/uL (ref 140–400)
RBC: 4.55 MIL/uL (ref 3.80–5.10)
RDW: 14 % (ref 11.0–15.0)
WBC: 6.2 10*3/uL (ref 3.8–10.8)

## 2016-08-16 LAB — TSH: TSH: 1.52 mIU/L

## 2016-08-16 NOTE — Progress Notes (Signed)
Patient ID: Tina Aguilar, female   DOB: 1955/12/23, 60 y.o.   MRN: [id number]   Location:  [facility]  Place of Service:  OFFICE  Provider: Arletha Grippe, DO  Patient Care Team: Gildardo Cranker, DO as PCP - General (Internal Medicine)  Extended Emergency Contact Information Primary Emergency Contact: Charlesetta Garibaldi [place] Phone: [phone] Relation: Daughter Secondary Emergency Contact: Silvio Clayman [place] of [place] Phone: [phone] Relation: Daughter  Code Status: FULL CODE Goals of Care: Advanced Directive information Advanced Directives 08/16/2016  Does patient have an advance directive? Yes  Type of Advance Directive Living will  Does patient want to make changes to advanced directive? No - Patient declined  Copy of advanced directive(s) in chart? No - copy requested     Chief Complaint  Patient presents with  . Annual Exam    Yearly Exam  . Flu Vaccine    refused    HPI: Patient is a 60 y.o. female seen in today for an annual wellness exam.  Last GYN exam in Aug 2016 and no abnormal cells seen on pap.  She is c/a skin lesion on nose x 6 mos. No bleeding, scaling or itching. She spent 1 hr per day in sun this summer. No hx melanoma or other skin cancer.  She has "liver spots" on hands and arms  Hyperlipidemia - takes simvastatin and fenofibrate but admits to missing several doses. LDL 179 and T chol 266  Hx adenomatous colonic polyps - Next colonoscopy due in 2022.  Depression screen PHQ 2/9 05/25/2014  Decreased Interest 0  Down, Depressed, Hopeless 0  PHQ - 2 Score 0    Fall Risk  08/16/2016 05/31/2015 05/25/2014  Falls in the past year? Yes No No  Number falls in past yr: 1 - -  Injury with Fall? No - -   No flowsheet data found.   Health Maintenance  Topic Date Due  . Hepatitis C Screening  03/01/1956  . HIV Screening  04/25/1970  . ZOSTAVAX  04/26/2015  . INFLUENZA VACCINE  06/16/2017 (Originally 05/14/2016)  .  MAMMOGRAM  02/21/2018  . PAP SMEAR  05/30/2018  . COLONOSCOPY  11/23/2020  . TETANUS/TDAP  05/25/2024    Urinary incontinence? No issues  Functional Status Survey: Is the patient deaf or have difficulty hearing?: No Does the patient have difficulty seeing, even when wearing glasses/contacts?: No Does the patient have difficulty concentrating, remembering, or making decisions?: No Does the patient have difficulty walking or climbing stairs?: No Does the patient have difficulty dressing or bathing?: No Does the patient have difficulty doing errands alone such as visiting a doctor's office or shopping?: No  Exercise? Has regular routine  Diet? Maintains healthy food choices  No exam data present  Hearing: no issues    Dentition: followed by dentis  Pain: none reported  Past Medical History:  Diagnosis Date  . Hx of colonic polyps   . Hyperlipidemia   . Pain in joint, site unspecified     Past Surgical History:  Procedure Laterality Date  . CESAREAN SECTION     x 2  . COLONOSCOPY     08/28/2005 and 11/23/2010  . DILATION AND CURETTAGE OF UTERUS      Family History  Problem Relation Age of Onset  . Coronary artery disease Mother   . Arthritis Mother   . Congestive Heart Failure Father   . Heart attack Father   . Other Brother     coronary artery  bypass graft age 76  . Hyperlipidemia Brother   . Diabetes Brother   . Hyperlipidemia Sister   . Diabetes Sister   . Heart disease Sister   . Hyperlipidemia Sister   . Hyperlipidemia Sister   . Heart attack Maternal Aunt     at age 42   Family Status  Relation Status  . Mother Deceased  . Father Deceased at age 58  . Brother Alive  . Sister Alive  . Daughter Alive  . Sister Alive  . Sister Alive  . Daughter Alive  . Maternal Aunt      Social History   Social History  . Marital status: Married    Spouse name: N/A  . Number of children: N/A  . Years of education: N/A   Occupational History  . Not on  file.   Social History Main Topics  . Smoking status: Former Smoker    Packs/day: 1.00    Years: 5.00  . Smokeless tobacco: Never Used     Comment: Quit at age 67  . Alcohol use No  . Drug use: No  . Sexual activity: Not on file   Other Topics Concern  . 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 Always use your most recent med list.          fenofibrate micronized 130 MG capsule Commonly known as:  ANTARA TAKE 1 CAPSULE (130 MG TOTAL) BY MOUTH DAILY BEFORE BREAKFAST.   simvastatin 20 MG tablet Commonly known as:  ZOCOR TAKE 1 TABLET BY MOUTH DAILY FOR HIGH CHOLESTEROL        Review of Systems:  Review of Systems  Skin:       Several lesions  All other systems reviewed and are negative.   Physical Exam: Vitals:   08/16/16 0947  BP: 138/88  Pulse: 71  Temp: 97.7 F (36.5 C)  TempSrc: Oral  SpO2: 97%  Weight: 167 lb (75.8 kg)  Height: 5' 3.39" (1.61 m)   Body mass index is 29.22 kg/m. Physical Exam  Constitutional: She is oriented to person, place, and time. She appears well-developed and well-nourished. No distress.  HENT:  Head: Normocephalic and atraumatic.  Right Ear: Hearing, tympanic membrane, external ear and ear canal normal.  Left Ear: Hearing, tympanic membrane, external ear and ear canal normal.  Nose:    Mouth/Throat: Uvula is midline, oropharynx is clear and moist and mucous membranes are normal. She does not have dentures.  Eyes: Conjunctivae, EOM and lids are normal. Pupils are equal, round, and reactive to light. No scleral icterus.  Neck: Trachea normal and normal range of motion. Neck supple. Carotid bruit is not present. No thyroid mass and no thyromegaly present.  Cardiovascular: Normal  rate, regular rhythm, normal heart sounds and intact distal pulses.  Exam reveals no gallop and no friction rub.   No murmur heard. No carotid bruit b/l. No LE edema b/l. No calf TTP.   Pulmonary/Chest: Effort normal and breath sounds normal. She has no wheezes. She has no rhonchi. She has no rales. Right breast exhibits no inverted nipple, no mass, no nipple discharge, no skin change and  no tenderness. Left breast exhibits tenderness. Left breast exhibits no inverted nipple, no mass, no nipple discharge and no skin change. Breasts are symmetrical.    Abdominal: Soft. Normal appearance, normal aorta and bowel sounds are normal. She exhibits no pulsatile midline mass and no mass. There is no hepatosplenomegaly. There is no tenderness. There is no rigidity, no rebound and no guarding. No hernia.  Musculoskeletal: Normal range of motion.  Lymphadenopathy:       Head (right side): No posterior auricular adenopathy present.       Head (left side): No posterior auricular adenopathy present.    She has no cervical adenopathy.       Right: No supraclavicular adenopathy present.       Left: No supraclavicular adenopathy present.  Neurological: She is alert and oriented to person, place, and time. She has normal strength and normal reflexes. No cranial nerve deficit. Gait normal.  Skin: Skin is warm, dry and intact. Lesion noted. No rash noted. Nails show no clubbing.  Multiple flat age spots on UE b/l  Psychiatric: She has a normal mood and affect. Her speech is normal and behavior is normal. Thought content normal. Cognition and memory are normal.    Labs reviewed:  Basic Metabolic Panel: No results for input(s): NA, K, CL, CO2, GLUCOSE, BUN, CREATININE, CALCIUM, MG, PHOS, TSH in the last 8760 hours. Liver Function Tests: No results for input(s): AST, ALT, ALKPHOS, BILITOT, PROT, ALBUMIN in the last 8760 hours. No results for input(s): LIPASE, AMYLASE in the last 8760 hours. No results for  input(s): AMMONIA in the last 8760 hours. CBC: No results for input(s): WBC, NEUTROABS, HGB, HCT, MCV, PLT in the last 8760 hours. Lipid Panel: No results for input(s): CHOL, HDL, LDLCALC, TRIG, CHOLHDL, LDLDIRECT in the last 8760 hours. No results found for: HGBA1C  Procedures: No results found. ECG OBTAINED AND REVIEWED BY MYSELF:  NSR @ 66 bpm, nml axis, LAE, incomplete RBBB, poor R wave progression. No acute ischemic changes. Other than RBBB incomp, no significant changes since 05/2015  Assessment/Plan   ICD-9-CM ICD-10-CM   1. Well adult exam V70.0 Z00.00 CBC with Differential/Platelets     CMP with eGFR     Urinalysis with Reflex Microscopic  2. Mixed hyperlipidemia 272.2 E78.2 Lipid Panel     TSH  3. Skin lesion of face 709.9 L98.9 Ambulatory referral to Dermatology  4. Painful lumpy left breast 611.71 N64.4    611.72 N63.20    benign recent mammogram; probable cyst  5. Colon cancer screening V76.51 Z12.11 Fecal occult blood, imunochemical   Pt is UTD on health maintenance. Vaccinations are UTD. Pt maintains a healthy lifestyle. Encouraged pt to exercise 30-45 minutes 4-5 times per week. Eat a well balanced diet. Avoid smoking. Limit alcohol intake. Wear seatbelt when riding in the car. Wear sun block (SPF >50) when spending extended times outside.   Will call with dermatology referral  Will call with lab results  Colon cancer screening with take home iFOB test  Follow up in 2 mos for recheck left breast    Dawna Jakes S. Perlie Gold  [facility] [address] [place], [place] [address] [phone] Cell (Monday-Friday 8 AM - 5 PM) [phone] After 5 PM and follow prompts

## 2016-08-16 NOTE — Patient Instructions (Signed)
Encouraged her to exercise 30-45 minutes 4-5 times per week. Eat a well balanced diet. Avoid smoking. Limit alcohol intake. Wear seatbelt when riding in the car. Wear sun block (SPF >50) when spending extended times outside.  Will call with dermatology referral  Will call with lab results  Colon cancer screening with take home iFOB test  Follow up in 2 mos for recheck left breast

## 2016-08-17 LAB — LIPID PANEL
CHOL/HDL RATIO: 3.4 ratio (ref <=5.0)
Cholesterol: 236 mg/dL — ABNORMAL HIGH (ref 125–200)
HDL: 69 mg/dL (ref >=46)
LDL Cholesterol: 149 mg/dL — ABNORMAL HIGH (ref <130)
Triglycerides: 89 mg/dL (ref <150)
VLDL: 18 mg/dL (ref <30)

## 2016-08-17 LAB — URINALYSIS, ROUTINE W REFLEX MICROSCOPIC
BILIRUBIN URINE: NEGATIVE
GLUCOSE, UA: NEGATIVE
HGB URINE DIPSTICK: NEGATIVE
Ketones, ur: NEGATIVE
LEUKOCYTES UA: NEGATIVE
Nitrite: NEGATIVE
PROTEIN: NEGATIVE
Specific Gravity, Urine: 1.026 (ref 1.001–1.035)
pH: 5 (ref 5.0–8.0)

## 2016-08-17 LAB — COMPLETE METABOLIC PANEL WITH GFR
ALBUMIN: 4.4 g/dL (ref 3.6–5.1)
ALK PHOS: 68 U/L (ref 33–130)
ALT: 28 U/L (ref 6–29)
AST: 25 U/L (ref 10–35)
BILIRUBIN TOTAL: 0.4 mg/dL (ref 0.2–1.2)
BUN: 16 mg/dL (ref 7–25)
CO2: 26 mmol/L (ref 20–31)
Calcium: 9.8 mg/dL (ref 8.6–10.4)
Chloride: 105 mmol/L (ref 98–110)
Creat: 0.79 mg/dL (ref 0.50–0.99)
GFR, Est African American: >89 mL/min (ref >=60)
GFR, Est Non African American: 81 mL/min (ref >=60)
Glucose, Bld: 105 mg/dL — ABNORMAL HIGH (ref 65–99)
Potassium: 4.3 mmol/L (ref 3.5–5.3)
SODIUM: 141 mmol/L (ref 135–146)
TOTAL PROTEIN: 7.6 g/dL (ref 6.1–8.1)

## 2016-08-21 ENCOUNTER — Other Ambulatory Visit: Payer: Self-pay | Admitting: Internal Medicine

## 2016-08-21 ENCOUNTER — Other Ambulatory Visit: Payer: Federal, State, Local not specified - PPO

## 2016-08-21 DIAGNOSIS — Z1211 Encounter for screening for malignant neoplasm of colon: Secondary | ICD-10-CM

## 2016-08-22 LAB — FECAL OCCULT BLOOD, IMMUNOCHEMICAL: Fecal Occult Blood: NEGATIVE

## 2016-10-16 ENCOUNTER — Ambulatory Visit (INDEPENDENT_AMBULATORY_CARE_PROVIDER_SITE_OTHER): Payer: Federal, State, Local not specified - PPO | Admitting: Internal Medicine

## 2016-10-16 ENCOUNTER — Encounter: Payer: Self-pay | Admitting: Internal Medicine

## 2016-10-16 VITALS — BP 132/80 | HR 69 | Temp 97.3°F | Ht 63.0 in

## 2016-10-16 DIAGNOSIS — E782 Mixed hyperlipidemia: Secondary | ICD-10-CM | POA: Diagnosis not present

## 2016-10-16 DIAGNOSIS — N6320 Unspecified lump in the left breast, unspecified quadrant: Secondary | ICD-10-CM

## 2016-10-16 DIAGNOSIS — N644 Mastodynia: Secondary | ICD-10-CM

## 2016-10-16 LAB — LIPID PANEL
Cholesterol: 199 mg/dL (ref <200)
HDL: 78 mg/dL (ref >50)
LDL Cholesterol: 108 mg/dL — ABNORMAL HIGH (ref <100)
Total CHOL/HDL Ratio: 2.6 Ratio (ref <5.0)
Triglycerides: 65 mg/dL (ref <150)
VLDL: 13 mg/dL (ref <30)

## 2016-10-16 MED ORDER — ZOSTER VACCINE LIVE 19400 UNT/0.65ML ~~LOC~~ SUSR: 0.6500 mL | Freq: Once | SUBCUTANEOUS | 0 refills | Status: AC

## 2016-10-16 NOTE — Progress Notes (Signed)
Patient ID: Tina Aguilar, female   DOB: Nov 04, 1955, 62 y.o.   MRN: [id number]    Location:  [facility] Place of Service: OFFICE  Chief Complaint  Patient presents with  . Follow-up    2 month follow up on left Breast    HPI:  60 yo female seen today for f/u left breast lump. She has not noticed any change in size of breast, nipple d/c or rash. She is UTD on mammograms. She needs lipid panel reck.   Hyperlipidemia - takes simvastatin and fenofibrate but admits to missing several doses. LDL 149 and T chol 236   Past Medical History:  Diagnosis Date  . Hx of colonic polyps   . Hyperlipidemia   . Pain in joint, site unspecified     Past Surgical History:  Procedure Laterality Date  . CESAREAN SECTION     x 2  . COLONOSCOPY     08/28/2005 and 11/23/2010  . DILATION AND CURETTAGE OF UTERUS      Patient Care Team: Gildardo Cranker, DO as PCP - General (Internal Medicine)  Social History   Social History  . Marital status: Married    Spouse name: N/A  . Number of children: N/A  . Years of education: N/A   Occupational History  . Not on file.   Social History Main Topics  . Smoking status: Former Smoker    Packs/day: 1.00    Years: 5.00  . Smokeless tobacco: Never Used     Comment: Quit at age 58  . Alcohol use No  . Drug use: No  . Sexual activity: Not on file   Other Topics Concern  . 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 She has a 5.00 pack-year smoking history. She has never used smokeless tobacco. She reports that she does not drink alcohol or use drugs.  Family History  Problem Relation Age of Onset  . Coronary artery disease Mother   . Arthritis Mother   . Congestive Heart Failure Father   . Heart attack Father   . Other Brother     coronary artery  bypass graft age 72  . Hyperlipidemia Brother   . Diabetes Brother   . Hyperlipidemia Sister   . Diabetes Sister   . Heart disease Sister   . Hyperlipidemia Sister   . Hyperlipidemia Sister   . Heart attack Maternal Aunt     at age 67   Family Status  Relation Status  . Mother Deceased  . Father Deceased at age 12  . Brother Alive  . Sister Alive  . Daughter Alive  . Sister Alive  . Sister Alive  . Daughter Alive  . Maternal Aunt      No Known Allergies  Medications: Patient's Medications  New Prescriptions   No medications on file  Previous Medications   FENOFIBRATE MICRONIZED (ANTARA) 130 MG CAPSULE    TAKE 1 CAPSULE (130 MG TOTAL) BY MOUTH DAILY BEFORE BREAKFAST.   SIMVASTATIN (ZOCOR) 20 MG TABLET    TAKE 1 TABLET BY MOUTH DAILY FOR HIGH CHOLESTEROL  Modified Medications   Modified Medication Previous Medication   ZOSTER VACCINE LIVE, PF, (ZOSTAVAX) 25053 UNT/0.65ML INJECTION Zoster Vaccine Live, PF, (ZOSTAVAX) 97673 UNT/0.65ML  injection      Inject 19,400 Units into the skin once.    Inject 0.65 mLs into the skin once.  Discontinued Medications   No medications on file    Review of Systems  All other systems reviewed and are negative.   Vitals:   10/16/16 0923  BP: 132/80  Pulse: 69  Temp: 97.3 F (36.3 C)  TempSrc: Oral  SpO2: 97%  Height: '5\' 3"'  (1.6 m)   There is no height or weight on file to calculate BMI.  Physical Exam  Constitutional: She is oriented to person, place, and time. She appears well-developed and well-nourished.  Pulmonary/Chest:    Neurological: She is alert and oriented to person, place, and time.  Skin: Skin is warm and dry. No rash noted.  Psychiatric: She has a normal mood and affect. Her behavior is normal. Thought content normal.     Labs reviewed: Appointment on 08/21/2016  Component Date Value Ref Range Status  . Fecal Occult Blood 08/22/2016 NEG  Negative Final   Comment:   The testing platform for fecal occult  blood testing is changing from Hemosure iFOB to InSure FIT testing. The StockClerk number for the [employer] is [id number]. For InSure FIT specimen requirements see order codes [id number]) Fecal Globin By Immunochemistry (Medicare). Hemosure iFOB testing will be phased out over the next few months and you will no longer be able to order [phone]) Fecal Occult Blood, Immunochemical (Medicare). If you have any questions, please contact your [facility] Account Representative directly, or call our Customer Service Department at [phone].     Office Visit on 08/16/2016  Component Date Value Ref Range Status  . WBC 08/16/2016 6.2  3.8 - 10.8 K/uL Final  . RBC 08/16/2016 4.55  3.80 - 5.10 MIL/uL Final  . Hemoglobin 08/16/2016 13.5  11.7 - 15.5 g/dL Final  . HCT 08/16/2016 41.1  35.0 - 45.0 % Final  . MCV 08/16/2016 90.3  80.0 - 100.0 fL Final  . MCH 08/16/2016 29.7  27.0 - 33.0 pg Final  . MCHC 08/16/2016 32.8  32.0 - 36.0 g/dL Final  . RDW 08/16/2016 14.0  11.0 - 15.0 % Final  . Platelets 08/16/2016 322  140 - 400 K/uL Final  . MPV 08/16/2016 10.6  7.5 - 12.5 fL Final  . Neutro Abs 08/16/2016 3348  1,500 - 7,800 cells/uL Final  . Lymphs Abs 08/16/2016 2294  850 - 3,900 cells/uL Final  . Monocytes Absolute 08/16/2016 434  200 - 950 cells/uL Final  . Eosinophils Absolute 08/16/2016 62  15 - 500 cells/uL Final  . Basophils Absolute 08/16/2016 62  0 - 200 cells/uL Final  . Neutrophils Relative % 08/16/2016 54  % Final  . Lymphocytes Relative 08/16/2016 37  % Final  . Monocytes Relative 08/16/2016 7  % Final  . Eosinophils Relative 08/16/2016 1  % Final  . Basophils Relative 08/16/2016 1  % Final  . Smear Review 08/16/2016 Criteria for review not met   Final  . Sodium 08/17/2016 141  135 - 146 mmol/L Final  . Potassium 08/17/2016 4.3  3.5 - 5.3 mmol/L Final  . Chloride 08/17/2016 105  98 - 110 mmol/L Final  . CO2 08/17/2016 26  20 - 31 mmol/L Final  . Glucose, Bld 08/17/2016  105* 65 - 99 mg/dL Final  . BUN 08/17/2016 16  7 - 25 mg/dL Final  . Creat 08/17/2016 0.79  0.50 - 0.99 mg/dL Final   Comment:   For patients >  or = 62 years of age: The upper reference limit for Creatinine is approximately 13% higher for people identified as African-American.     . Total Bilirubin 08/17/2016 0.4  0.2 - 1.2 mg/dL Final  . Alkaline Phosphatase 08/17/2016 68  33 - 130 U/L Final  . AST 08/17/2016 25  10 - 35 U/L Final  . ALT 08/17/2016 28  6 - 29 U/L Final  . Total Protein 08/17/2016 7.6  6.1 - 8.1 g/dL Final  . Albumin 08/17/2016 4.4  3.6 - 5.1 g/dL Final  . Calcium 08/17/2016 9.8  8.6 - 10.4 mg/dL Final  . GFR, Est African American 08/17/2016 >89  >=60 mL/min Final  . GFR, Est Non African American 08/17/2016 81  >=60 mL/min Final  . Cholesterol 08/17/2016 236* 125 - 200 mg/dL Final  . Triglycerides 08/17/2016 89  <150 mg/dL Final  . HDL 08/17/2016 69  >=46 mg/dL Final  . Total CHOL/HDL Ratio 08/17/2016 3.4  <=5.0 Ratio Final  . VLDL 08/17/2016 18  <30 mg/dL Final  . LDL Cholesterol 08/17/2016 149* <130 mg/dL Final   Comment:   Total Cholesterol/HDL Ratio:CHD Risk                        Coronary Heart Disease Risk Table                                        Men       Women          1/2 Average Risk              3.4        3.3              Average Risk              5.0        4.4           2X Average Risk              9.6        7.1           3X Average Risk             23.4       11.0 Use the calculated Patient Ratio above and the CHD Risk table  to determine the patient's CHD Risk.   Marland Kitchen TSH 08/16/2016 1.52  mIU/L Final   Comment:   Reference Range   > or = 20 Years  0.40-4.50   Pregnancy Range First trimester  0.26-2.66 Second trimester 0.55-2.73 Third trimester  0.43-2.91     . Color, Urine 08/17/2016 YELLOW  YELLOW Final  . APPearance 08/17/2016 CLEAR  CLEAR Final  . Specific Gravity, Urine 08/17/2016 1.026  1.001 - 1.035 Final  . pH 08/17/2016 5.0   5.0 - 8.0 Final  . Glucose, UA 08/17/2016 NEGATIVE  NEGATIVE Final  . Bilirubin Urine 08/17/2016 NEGATIVE  NEGATIVE Final  . Ketones, ur 08/17/2016 NEGATIVE  NEGATIVE Final  . Hgb urine dipstick 08/17/2016 NEGATIVE  NEGATIVE Final  . Protein, ur 08/17/2016 NEGATIVE  NEGATIVE Final  . Nitrite 08/17/2016 NEGATIVE  NEGATIVE Final  . Leukocytes, UA 08/17/2016 NEGATIVE  NEGATIVE Final    No results found.   Assessment/Plan   ICD-9-CM ICD-10-CM   1. Painful lumpy left breast 611.71 N64.4 US BREAST COMPLETE UNI LEFT  INC AXILLA   611.72 N63.20   2. Mixed hyperlipidemia 272.2 E78.2 Lipid Panel   Will call with breast US results  Continue current medications as ordered  Follow up in Nov 2018 for [place]. Perlie Gold  [facility] [address] [place], [place] [address] [phone] Cell (Monday-Friday 8 AM - 5 PM) [phone] After 5 PM and follow prompts

## 2016-10-16 NOTE — Patient Instructions (Signed)
Will call with breast US results  Continue current medications as ordered  Follow up in Nov 2018 for CPE

## 2016-10-17 ENCOUNTER — Other Ambulatory Visit: Payer: Self-pay

## 2016-10-17 DIAGNOSIS — N63 Unspecified lump in unspecified breast: Secondary | ICD-10-CM

## 2016-10-28 ENCOUNTER — Other Ambulatory Visit: Payer: Self-pay | Admitting: Internal Medicine

## 2016-10-28 ENCOUNTER — Ambulatory Visit
Admission: RE | Admit: 2016-10-28 | Discharge: 2016-10-28 | Disposition: A | Discharge status: Home / Self Care | Payer: Federal, State, Local not specified - PPO | Source: Ambulatory Visit | Attending: Internal Medicine | Admitting: Internal Medicine

## 2016-10-28 DIAGNOSIS — N6320 Unspecified lump in the left breast, unspecified quadrant: Secondary | ICD-10-CM

## 2016-10-28 DIAGNOSIS — N63 Unspecified lump in unspecified breast: Secondary | ICD-10-CM

## 2016-10-28 DIAGNOSIS — N644 Mastodynia: Principal | ICD-10-CM

## 2016-10-29 ENCOUNTER — Telehealth: Payer: Self-pay

## 2016-10-29 NOTE — Telephone Encounter (Signed)
A letter was returned to office today that contained patient's lab results from 10/18/16. The letter was returned due to having wrong street address listed. I called patient to verify her address and to give her the results. Patient stated that she did not need to have the copy of results mailed to her since they were normal results.

## 2017-02-04 ENCOUNTER — Telehealth: Payer: Self-pay | Admitting: Internal Medicine

## 2017-02-04 NOTE — Telephone Encounter (Signed)
ok 

## 2017-02-04 NOTE — Telephone Encounter (Signed)
Patient would like to switch PCP, states distance as reason. Wanted to ok it by both providers.

## 2017-02-04 NOTE — Telephone Encounter (Signed)
Yes

## 2017-03-08 ENCOUNTER — Encounter (HOSPITAL_COMMUNITY): Payer: Self-pay

## 2017-03-08 ENCOUNTER — Emergency Department (HOSPITAL_COMMUNITY)
Admission: EM | Admit: 2017-03-08 | Discharge: 2017-03-09 | Disposition: A | Discharge status: Home / Self Care | Payer: BC Managed Care – PPO | Attending: Emergency Medicine | Admitting: Emergency Medicine

## 2017-03-08 DIAGNOSIS — Z87891 Personal history of nicotine dependence: Secondary | ICD-10-CM | POA: Insufficient documentation

## 2017-03-08 DIAGNOSIS — N23 Unspecified renal colic: Secondary | ICD-10-CM | POA: Diagnosis not present

## 2017-03-08 DIAGNOSIS — R1032 Left lower quadrant pain: Secondary | ICD-10-CM | POA: Diagnosis present

## 2017-03-08 LAB — CBC
HEMATOCRIT: 41.4 % (ref 36.0–46.0)
Hemoglobin: 14.1 g/dL (ref 12.0–15.0)
MCH: 30.5 pg (ref 26.0–34.0)
MCHC: 34.1 g/dL (ref 30.0–36.0)
MCV: 89.4 fL (ref 78.0–100.0)
Platelets: 295 10*3/uL (ref 150–400)
RBC: 4.63 MIL/uL (ref 3.87–5.11)
RDW: 13.2 % (ref 11.5–15.5)
WBC: 14.8 10*3/uL — ABNORMAL HIGH (ref 4.0–10.5)

## 2017-03-08 LAB — URINALYSIS, ROUTINE W REFLEX MICROSCOPIC
BACTERIA UA: NONE SEEN
Bilirubin Urine: NEGATIVE
GLUCOSE, UA: NEGATIVE mg/dL
Hgb urine dipstick: NEGATIVE
KETONES UR: NEGATIVE mg/dL
Nitrite: NEGATIVE
PROTEIN: NEGATIVE mg/dL
Specific Gravity, Urine: 1.017 (ref 1.005–1.030)
pH: 6 (ref 5.0–8.0)

## 2017-03-08 MED ORDER — MORPHINE SULFATE (PF) 2 MG/ML IV SOLN
4.0000 mg | Freq: Once | INTRAVENOUS | Status: AC
  Administered 2017-03-08: 4 mg via INTRAVENOUS
  Filled 2017-03-08: qty 2

## 2017-03-08 MED ORDER — ONDANSETRON HCL 4 MG/2ML IJ SOLN
4.0000 mg | Freq: Once | INTRAMUSCULAR | Status: AC
  Administered 2017-03-08: 4 mg via INTRAVENOUS
  Filled 2017-03-08: qty 2

## 2017-03-08 NOTE — ED Notes (Signed)
Bed: WA03 Expected date:  Expected time:  Means of arrival:  Comments: 74f abdominal pain

## 2017-03-08 NOTE — ED Notes (Signed)
Per EMS- from home, LLQ pain for past 2 hours. Also c/o vomiting and diarrhea.  For the past week, difficulty urinating and frequent urge to urinate.

## 2017-03-08 NOTE — ED Provider Notes (Signed)
[facility] DEPT Provider Note   CSN: [id number] Arrival date & time: 03/08/17  2218   By signing my name below, I, Eunice Blase, attest that this documentation has been prepared under the direction and in the presence of Varney Biles, MD. Electronically signed, Eunice Blase, ED Scribe. 03/12/17. 11:54 PM.   History   Chief Complaint Chief Complaint  Patient presents with  . Abdominal Pain   The history is provided by the patient and medical records. No language interpreter was used.    Tina Aguilar is a 61 y.o. female BIB EMS with h/o arthritis and HTN to the Emergency Department with concern for LLQ pain onset ~8 PM this evening. She notes associated nausea, vomiting x 1 lasting 5 minutes, diarrhea x 1 lasting 30 minutes, back pain, and flank pain. Pt also notes hot flashes, diaphoresis, high blood pressure readings at home and urinary urgency. She notes possible suspicious food intake. Constant, fluctuating pain that is decreased to a moderate 8/10 from a severe 10/10 on evaluation; pt adds this pain is radiating to the L flank area. She further adds her pain is worse when palpated and it began radiating to there L chest on evaluation. No other modifying factors noted.  H/o 2 C-section noted. No h/o diabetes, ulcers or hysterectomy. No hematemesis, bloody stool, dysuria or hematuria noted.  Past Medical History:  Diagnosis Date  . Hx of colonic polyps   . Hyperlipidemia   . Pain in joint, site unspecified     Patient Active Problem List   Diagnosis Date Noted  . Myalgia and myositis 05/25/2014  . Routine general medical examination at a health care facility 05/25/2014  . Routine health maintenance 09/04/2011  . COLONIC POLYPS 08/30/2010  . HYPERLIPIDEMIA 06/30/2007  . PAIN IN JOINT, UNSPECIFIED SITE 06/30/2007    Past Surgical History:  Procedure Laterality Date  . CESAREAN SECTION     x 2  . COLONOSCOPY     08/28/2005 and 11/23/2010  . DILATION AND CURETTAGE OF  UTERUS      OB History    No data available       Home Medications    Prior to Admission medications   Medication Sig Start Date End Date Taking? Authorizing Provider  fenofibrate micronized (ANTARA) 130 MG capsule TAKE 1 CAPSULE (130 MG TOTAL) BY MOUTH DAILY BEFORE BREAKFAST. Patient taking differently: Take 130 mg by mouth daily before breakfast 04/15/16  Yes Eulas Post, Monica, DO  simvastatin (ZOCOR) 20 MG tablet TAKE 1 TABLET BY MOUTH DAILY FOR HIGH CHOLESTEROL Patient taking differently: Take 20 mg by mouth daily 08/21/16  Yes Gildardo Cranker, DO  HYDROcodone-acetaminophen (NORCO/VICODIN) 5-325 MG tablet Take 1 tablet by mouth every 6 (six) hours as needed. 03/09/17   Varney Biles, MD  ondansetron (ZOFRAN ODT) 8 MG disintegrating tablet Take 1 tablet (8 mg total) by mouth every 8 (eight) hours as needed for nausea. 03/09/17   Varney Biles, MD  tamsulosin (FLOMAX) 0.4 MG CAPS capsule Take 1 capsule (0.4 mg total) by mouth daily. 03/09/17   Varney Biles, MD    Family History Family History  Problem Relation Age of Onset  . Coronary artery disease Mother   . Arthritis Mother   . Congestive Heart Failure Father   . Heart attack Father   . Other Brother        coronary artery bypass graft age 47  . Hyperlipidemia Brother   . Diabetes Brother   . Hyperlipidemia Sister   . Diabetes Sister   .  Heart disease Sister   . Hyperlipidemia Sister   . Hyperlipidemia Sister   . Heart attack Maternal Aunt        at age 88    Social History Social History  Substance Use Topics  . Smoking status: Former Smoker    Packs/day: 1.00    Years: 5.00  . Smokeless tobacco: Never Used     Comment: Quit at age 24  . Alcohol use No     Allergies   Patient has no known allergies.   Review of Systems Review of Systems  Constitutional: Positive for chills and diaphoresis.  Cardiovascular: Positive for chest pain.  Gastrointestinal: Positive for abdominal pain, diarrhea, nausea and  vomiting. Negative for blood in stool.  Genitourinary: Positive for flank pain. Negative for dysuria and hematuria.  Musculoskeletal: Positive for back pain.  All other systems reviewed and are negative.    Physical Exam Updated Vital Signs BP (!) 181/99 (BP Location: Right Arm)   Pulse 81   Temp 97.7 F (36.5 C) (Oral)   Resp 18   SpO2 98%   Physical Exam  Constitutional: She is oriented to person, place, and time. She appears well-developed and well-nourished.  HENT:  Head: Normocephalic.  Eyes: EOM are normal.  Neck: Normal range of motion.  Cardiovascular: Normal rate, regular rhythm and normal heart sounds.   Pulmonary/Chest: Effort normal and breath sounds normal. No respiratory distress. She has no wheezes. She has no rales.  Abdominal: Soft. Normal appearance and bowel sounds are normal. She exhibits no distension. There is tenderness in the left lower quadrant. There is no rebound and no guarding.  Musculoskeletal: Normal range of motion.  Neurological: She is alert and oriented to person, place, and time.  Psychiatric: She has a normal mood and affect.  Nursing note and vitals reviewed.    ED Treatments / Results  DIAGNOSTIC STUDIES: Oxygen Saturation is 98% on RA, NL by my interpretation.    COORDINATION OF CARE: 11:14 PM-Discussed next steps with pt. Pt verbalized understanding and is agreeable with the plan. Will order imaging and medication.   Labs (all labs ordered are listed, but only abnormal results are displayed) Labs Reviewed  COMPREHENSIVE METABOLIC PANEL - Abnormal; Notable for the following:       Result Value   Glucose, Bld 129 (*)    BUN 22 (*)    All other components within normal limits  CBC - Abnormal; Notable for the following:    WBC 14.8 (*)    All other components within normal limits  URINALYSIS, ROUTINE W REFLEX MICROSCOPIC - Abnormal; Notable for the following:    Leukocytes, UA MODERATE (*)    Squamous Epithelial / LPF 0-5 (*)      All other components within normal limits  LIPASE, BLOOD  MAGNESIUM  TROPONIN I    EKG  EKG Interpretation  Date/Time:  Sunday Mar 09 2017 00:35:25 EDT Ventricular Rate:  82 PR Interval:    QRS Duration: 90 QT Interval:  412 QTC Calculation: 482 R Axis:   65 Text Interpretation:  Sinus rhythm No acute changes No significant change since last tracing Confirmed by Varney Biles [phone]) on 03/09/2017 3:48:08 AM Also confirmed by Varney Biles [phone]), editor Drema Pry ([id number])  on 03/09/2017 7:59:34 AM       Radiology No results found.  Procedures Procedures (including critical care time)  Medications Ordered in ED Medications  morphine 2 MG/ML injection 4 mg (4 mg Intravenous Given 03/08/17 2342)  ondansetron (  ZOFRAN) injection 4 mg (4 mg Intravenous Given 03/08/17 2340)  iopamidol (ISOVUE-300) 61 % injection 100 mL (100 mLs Intravenous Contrast Given 03/09/17 0016)  ketorolac (TORADOL) 30 MG/ML injection 15 mg (15 mg Intravenous Given 03/09/17 0048)  HYDROcodone-acetaminophen (NORCO/VICODIN) 5-325 MG per tablet 1 tablet (1 tablet Oral Given 03/09/17 0402)     Initial Impression / Assessment and Plan / ED Course  I have reviewed the triage vital signs and the nursing notes.  Pertinent labs & imaging results that were available during my care of the patient were reviewed by me and considered in my medical decision making (see chart for details).     Pt comes in with cc of lower quadrant abd pain and flank pain. PT is also having diarrhea and urinary urgency. No hx of renal stone or diverticulitis. DDX: Diverticulitis Ureteral colic Perforated viscus.  Plan is to get CT scan.  LATE ENTRY: CT scan showed a rather large ureteral stone. Possibly in the bladder. Pain is in relative control - so the stone could be in the bladder. I spoke with Dr. Rose Phi - and he recommends outpatient f.u if pain is in control.  Results from the ER workup discussed with the  patient face to face and all questions answered to the best of my ability. Strict ER return precautions have been discussed, and patient is agreeing with the plan and is comfortable with the workup done and the recommendations from the ER.   Final Clinical Impressions(s) / ED Diagnoses   Final diagnoses:  Ureteral colic    New Prescriptions Discharge Medication List as of 03/09/2017  3:52 AM    START taking these medications   Details  HYDROcodone-acetaminophen (NORCO/VICODIN) 5-325 MG tablet Take 1 tablet by mouth every 6 (six) hours as needed., Starting Sun 03/09/2017, Print    ondansetron (ZOFRAN ODT) 8 MG disintegrating tablet Take 1 tablet (8 mg total) by mouth every 8 (eight) hours as needed for nausea., Starting Sun 03/09/2017, Print    tamsulosin (FLOMAX) 0.4 MG CAPS capsule Take 1 capsule (0.4 mg total) by mouth daily., Starting Sun 03/09/2017, Print       I personally performed the services described in this documentation, which was scribed in my presence. The recorded information has been reviewed and is accurate.    Varney Biles, MD 03/12/17 [phone]

## 2017-03-09 ENCOUNTER — Emergency Department (HOSPITAL_COMMUNITY): Payer: BC Managed Care – PPO

## 2017-03-09 DIAGNOSIS — N23 Unspecified renal colic: Secondary | ICD-10-CM | POA: Diagnosis not present

## 2017-03-09 LAB — COMPREHENSIVE METABOLIC PANEL
ALBUMIN: 4.5 g/dL (ref 3.5–5.0)
ALT: 35 U/L (ref 14–54)
ANION GAP: 9 (ref 5–15)
AST: 40 U/L (ref 15–41)
Alkaline Phosphatase: 73 U/L (ref 38–126)
BILIRUBIN TOTAL: 0.6 mg/dL (ref 0.3–1.2)
BUN: 22 mg/dL — ABNORMAL HIGH (ref 6–20)
CO2: 26 mmol/L (ref 22–32)
Calcium: 9.9 mg/dL (ref 8.9–10.3)
Chloride: 106 mmol/L (ref 101–111)
Creatinine, Ser: 0.98 mg/dL (ref 0.44–1.00)
GFR calc non Af Amer: >60 mL/min (ref >60)
GLUCOSE: 129 mg/dL — AB (ref 65–99)
POTASSIUM: 4.1 mmol/L (ref 3.5–5.1)
Sodium: 141 mmol/L (ref 135–145)
Total Protein: 8 g/dL (ref 6.5–8.1)

## 2017-03-09 LAB — MAGNESIUM: MAGNESIUM: 1.8 mg/dL (ref 1.7–2.4)

## 2017-03-09 LAB — LIPASE, BLOOD: Lipase: 35 U/L (ref 11–51)

## 2017-03-09 LAB — TROPONIN I

## 2017-03-09 MED ORDER — IOPAMIDOL (ISOVUE-300) INJECTION 61%
100.0000 mL | Freq: Once | INTRAVENOUS | Status: AC | PRN
  Administered 2017-03-09: 100 mL via INTRAVENOUS

## 2017-03-09 MED ORDER — HYDROCODONE-ACETAMINOPHEN 5-325 MG PO TABS: 1.0000 | ORAL_TABLET | Freq: Four times a day (QID) | ORAL | 0 refills | Status: DC | PRN

## 2017-03-09 MED ORDER — KETOROLAC TROMETHAMINE 30 MG/ML IJ SOLN
15.0000 mg | Freq: Once | INTRAMUSCULAR | Status: AC
  Administered 2017-03-09: 15 mg via INTRAVENOUS
  Filled 2017-03-09: qty 1

## 2017-03-09 MED ORDER — HYDROCODONE-ACETAMINOPHEN 5-325 MG PO TABS
1.0000 | ORAL_TABLET | Freq: Once | ORAL | Status: AC
  Administered 2017-03-09: 1 via ORAL
  Filled 2017-03-09: qty 1

## 2017-03-09 MED ORDER — TAMSULOSIN HCL 0.4 MG PO CAPS: 0.4000 mg | ORAL_CAPSULE | Freq: Every day | ORAL | 0 refills | Status: DC

## 2017-03-09 MED ORDER — IOPAMIDOL (ISOVUE-300) INJECTION 61%
INTRAVENOUS | Status: AC
  Filled 2017-03-09: qty 100

## 2017-03-09 MED ORDER — ONDANSETRON 8 MG PO TBDP: 8.0000 mg | ORAL_TABLET | Freq: Three times a day (TID) | ORAL | 0 refills | Status: DC | PRN

## 2017-03-09 NOTE — Discharge Instructions (Signed)
We saw you in the ER for the abdominal pain. °Our results indicate that you have a kidney stone. °We were able to get your pain is relative control, and we can safely send you home. ° °Take the meds prescribed. °Set up an appointment with the Urologist. °If the pain is unbearable, you start having fevers, chills, and are unable to keep any meds down - then return to the ER. °  °

## 2017-03-17 ENCOUNTER — Other Ambulatory Visit: Payer: Self-pay | Admitting: Urology

## 2017-04-29 ENCOUNTER — Telehealth: Payer: Self-pay | Admitting: Emergency Medicine

## 2017-04-29 NOTE — Telephone Encounter (Signed)
Pre visit attempted left voicemail for pt to return call to office.

## 2017-04-30 ENCOUNTER — Encounter (HOSPITAL_BASED_OUTPATIENT_CLINIC_OR_DEPARTMENT_OTHER): Payer: Self-pay | Admitting: *Deleted

## 2017-04-30 NOTE — Progress Notes (Signed)
NPO AFTER MN W/ EXCEPTION CLEAR LIQUIDS UNTIL 0600 (NO CREAM/ MILK PRODUCTS).    ARRIVE AT 1030.  NEEDS HG.

## 2017-05-01 ENCOUNTER — Ambulatory Visit: Payer: Federal, State, Local not specified - PPO | Admitting: Family Medicine

## 2017-05-01 DIAGNOSIS — Z0289 Encounter for other administrative examinations: Secondary | ICD-10-CM

## 2017-05-06 ENCOUNTER — Ambulatory Visit (HOSPITAL_BASED_OUTPATIENT_CLINIC_OR_DEPARTMENT_OTHER): Payer: BC Managed Care – PPO | Admitting: Anesthesiology

## 2017-05-06 ENCOUNTER — Ambulatory Visit (HOSPITAL_BASED_OUTPATIENT_CLINIC_OR_DEPARTMENT_OTHER)
Admission: RE | Admit: 2017-05-06 | Discharge: 2017-05-06 | Disposition: A | Discharge status: Home / Self Care | Payer: BC Managed Care – PPO | Source: Ambulatory Visit | Attending: Urology | Admitting: Urology

## 2017-05-06 ENCOUNTER — Encounter (HOSPITAL_BASED_OUTPATIENT_CLINIC_OR_DEPARTMENT_OTHER): Payer: Self-pay | Admitting: *Deleted

## 2017-05-06 ENCOUNTER — Encounter (HOSPITAL_BASED_OUTPATIENT_CLINIC_OR_DEPARTMENT_OTHER)
Admission: RE | Disposition: A | Discharge status: Home / Self Care | Payer: Self-pay | Source: Ambulatory Visit | Attending: Urology

## 2017-05-06 DIAGNOSIS — N201 Calculus of ureter: Secondary | ICD-10-CM | POA: Diagnosis present

## 2017-05-06 DIAGNOSIS — Z87891 Personal history of nicotine dependence: Secondary | ICD-10-CM | POA: Diagnosis not present

## 2017-05-06 HISTORY — DX: Personal history of colonic polyps: Z86.010

## 2017-05-06 HISTORY — DX: Unspecified osteoarthritis, unspecified site: M19.90

## 2017-05-06 HISTORY — DX: Mixed hyperlipidemia: E78.2

## 2017-05-06 HISTORY — DX: Frequency of micturition: R35.0

## 2017-05-06 HISTORY — DX: Calculus of ureter: N20.1

## 2017-05-06 HISTORY — DX: Presence of spectacles and contact lenses: Z97.3

## 2017-05-06 HISTORY — PX: CYSTOSCOPY/URETEROSCOPY/HOLMIUM LASER/STENT PLACEMENT: SHX6546

## 2017-05-06 LAB — POCT HEMOGLOBIN-HEMACUE: HEMOGLOBIN: 13.6 g/dL (ref 12.0–15.0)

## 2017-05-06 SURGERY — CYSTOSCOPY/URETEROSCOPY/HOLMIUM LASER/STENT PLACEMENT
Anesthesia: General | Laterality: Left

## 2017-05-06 MED ORDER — KETOROLAC TROMETHAMINE 30 MG/ML IJ SOLN
INTRAMUSCULAR | Status: AC
  Filled 2017-05-06: qty 1

## 2017-05-06 MED ORDER — DEXAMETHASONE SODIUM PHOSPHATE 10 MG/ML IJ SOLN
INTRAMUSCULAR | Status: AC
  Filled 2017-05-06: qty 1

## 2017-05-06 MED ORDER — CIPROFLOXACIN IN D5W 400 MG/200ML IV SOLN
400.0000 mg | INTRAVENOUS | Status: AC
  Administered 2017-05-06: 400 mg via INTRAVENOUS
  Filled 2017-05-06: qty 200

## 2017-05-06 MED ORDER — LIDOCAINE 2% (20 MG/ML) 5 ML SYRINGE
INTRAMUSCULAR | Status: AC
  Filled 2017-05-06: qty 5

## 2017-05-06 MED ORDER — ACETAMINOPHEN 160 MG/5ML PO SOLN
325.0000 mg | ORAL | Status: DC | PRN
  Filled 2017-05-06: qty 20.3

## 2017-05-06 MED ORDER — HYDROCODONE-ACETAMINOPHEN 5-325 MG PO TABS: 1.0000 | ORAL_TABLET | ORAL | 0 refills | Status: DC | PRN

## 2017-05-06 MED ORDER — DEXAMETHASONE SODIUM PHOSPHATE 4 MG/ML IJ SOLN
INTRAMUSCULAR | Status: DC | PRN
  Administered 2017-05-06: 10 mg via INTRAVENOUS

## 2017-05-06 MED ORDER — FENTANYL CITRATE (PF) 100 MCG/2ML IJ SOLN
25.0000 ug | INTRAMUSCULAR | Status: DC | PRN
  Filled 2017-05-06: qty 1

## 2017-05-06 MED ORDER — KETOROLAC TROMETHAMINE 30 MG/ML IJ SOLN
INTRAMUSCULAR | Status: DC | PRN
  Administered 2017-05-06: 30 mg via INTRAVENOUS

## 2017-05-06 MED ORDER — OXYCODONE HCL 5 MG/5ML PO SOLN
5.0000 mg | Freq: Once | ORAL | Status: DC | PRN
  Filled 2017-05-06: qty 5

## 2017-05-06 MED ORDER — CEPHALEXIN 500 MG PO CAPS: 500.0000 mg | ORAL_CAPSULE | Freq: Three times a day (TID) | ORAL | 0 refills | Status: AC

## 2017-05-06 MED ORDER — PROPOFOL 10 MG/ML IV BOLUS
INTRAVENOUS | Status: AC
  Filled 2017-05-06: qty 20

## 2017-05-06 MED ORDER — FENTANYL CITRATE (PF) 100 MCG/2ML IJ SOLN
INTRAMUSCULAR | Status: DC | PRN
Start: 2017-05-06 — End: 2017-05-06
  Administered 2017-05-06: 50 ug via INTRAVENOUS

## 2017-05-06 MED ORDER — MIDAZOLAM HCL 5 MG/5ML IJ SOLN
INTRAMUSCULAR | Status: DC | PRN
Start: 2017-05-06 — End: 2017-05-06
  Administered 2017-05-06: 2 mg via INTRAVENOUS

## 2017-05-06 MED ORDER — ONDANSETRON HCL 4 MG/2ML IJ SOLN
INTRAMUSCULAR | Status: DC | PRN
  Administered 2017-05-06: 4 mg via INTRAVENOUS

## 2017-05-06 MED ORDER — LACTATED RINGERS IV SOLN
INTRAVENOUS | Status: DC
  Administered 2017-05-06: 11:00:00 via INTRAVENOUS
  Filled 2017-05-06: qty 1000

## 2017-05-06 MED ORDER — CIPROFLOXACIN IN D5W 400 MG/200ML IV SOLN
INTRAVENOUS | Status: AC
  Filled 2017-05-06: qty 200

## 2017-05-06 MED ORDER — FENTANYL CITRATE (PF) 100 MCG/2ML IJ SOLN
INTRAMUSCULAR | Status: AC
  Filled 2017-05-06: qty 2

## 2017-05-06 MED ORDER — ONDANSETRON HCL 4 MG/2ML IJ SOLN
INTRAMUSCULAR | Status: AC
  Filled 2017-05-06: qty 2

## 2017-05-06 MED ORDER — OXYCODONE HCL 5 MG PO TABS
5.0000 mg | ORAL_TABLET | Freq: Once | ORAL | Status: DC | PRN
  Filled 2017-05-06: qty 1

## 2017-05-06 MED ORDER — LIDOCAINE 2% (20 MG/ML) 5 ML SYRINGE
INTRAMUSCULAR | Status: DC | PRN
  Administered 2017-05-06: 50 mg via INTRAVENOUS

## 2017-05-06 MED ORDER — PROPOFOL 10 MG/ML IV BOLUS
INTRAVENOUS | Status: DC | PRN
  Administered 2017-05-06: 20 mg via INTRAVENOUS
  Administered 2017-05-06: 180 mg via INTRAVENOUS

## 2017-05-06 MED ORDER — MIDAZOLAM HCL 2 MG/2ML IJ SOLN
INTRAMUSCULAR | Status: AC
  Filled 2017-05-06: qty 2

## 2017-05-06 MED ORDER — ACETAMINOPHEN 325 MG PO TABS
325.0000 mg | ORAL_TABLET | ORAL | Status: DC | PRN
  Filled 2017-05-06: qty 2

## 2017-05-06 MED FILL — HYDROCODON-APAP 5-325: 5-325 | 3 days supply | Qty: 30 | Fill #0

## 2017-05-06 MED FILL — CEPHALEXIN 500 MG CAPSULE: 500 | 2 days supply | Qty: 6 | Fill #0

## 2017-05-06 SURGICAL SUPPLY — 26 items
BAG DRAIN URO-CYSTO SKYTR STRL (DRAIN) ×3 IMPLANT
BASKET ZERO TIP NITINOL 2.4FR (BASKET) ×3 IMPLANT
CATH URET 5FR 28IN OPEN ENDED (CATHETERS) ×3 IMPLANT
CATH URET DUAL LUMEN 6-10FR 50 (CATHETERS) IMPLANT
CLOTH BEACON ORANGE TIMEOUT ST (SAFETY) ×3 IMPLANT
FIBER LASER FLEXIVA 200 (UROLOGICAL SUPPLIES) ×3 IMPLANT
FIBER LASER FLEXIVA 365 (UROLOGICAL SUPPLIES) IMPLANT
FIBER LASER TRAC TIP (UROLOGICAL SUPPLIES) IMPLANT
GLOVE BIO SURGEON STRL SZ7.5 (GLOVE) ×3 IMPLANT
GOWN STRL REUS W/ TWL XL LVL3 (GOWN DISPOSABLE) ×1 IMPLANT
GOWN STRL REUS W/TWL XL LVL3 (GOWN DISPOSABLE) ×2
GUIDEWIRE STR DUAL SENSOR (WIRE) ×6 IMPLANT
GUIDEWIRE SUPER STIFF (WIRE) ×3 IMPLANT
INFUSOR MANOMETER BAG 3000ML (MISCELLANEOUS) ×3 IMPLANT
IV NS IRRIG 3000ML ARTHROMATIC (IV SOLUTION) ×3 IMPLANT
KIT RM TURNOVER CYSTO AR (KITS) ×3 IMPLANT
MANIFOLD NEPTUNE II (INSTRUMENTS) ×3 IMPLANT
NS IRRIG 500ML POUR BTL (IV SOLUTION) IMPLANT
PACK CYSTO (CUSTOM PROCEDURE TRAY) ×3 IMPLANT
SCRUB PCMX 4 OZ (MISCELLANEOUS) IMPLANT
SHEATH ACCESS URETERAL 38CM (SHEATH) IMPLANT
STENT URET 6FRX26 CONTOUR (STENTS) ×3 IMPLANT
SYRINGE IRR TOOMEY STRL 70CC (SYRINGE) ×3 IMPLANT
TUBE CONNECTING 12'X1/4 (SUCTIONS) ×1
TUBE CONNECTING 12X1/4 (SUCTIONS) ×2 IMPLANT
WATER STERILE IRR 500ML POUR (IV SOLUTION) ×3 IMPLANT

## 2017-05-06 NOTE — Anesthesia Postprocedure Evaluation (Signed)
Anesthesia Post Note  Patient: Tina Aguilar  Procedure(s) Performed: Procedure(s) (LRB): CYSTOSCOPY/URETEROSCOPY/HOLMIUM LASER/STENT PLACEMENT (Left)     Patient location during evaluation: PACU Anesthesia Type: General Level of consciousness: awake and alert Pain management: pain level controlled Vital Signs Assessment: post-procedure vital signs reviewed and stable Respiratory status: spontaneous breathing, nonlabored ventilation, respiratory function stable and patient connected to nasal cannula oxygen Cardiovascular status: blood pressure returned to baseline and stable Postop Assessment: no signs of nausea or vomiting Anesthetic complications: no    Last Vitals:  Vitals:   05/06/17 1330 05/06/17 1345  BP: (!) 146/101 (!) 154/83  Pulse: 62 62  Resp: 11 12  Temp:      Last Pain:  Vitals:   05/06/17 1345  TempSrc:   PainSc: 0-No pain                 Effie Berkshire

## 2017-05-06 NOTE — H&P (Signed)
CC: I have ureteral stone.  HPI: Tina Aguilar is a 61 year-old female patient who is here for ureteral stone.  The problem is on the left side. This is her first kidney stone. She is currently having flank pain. She denies having back pain, groin pain, nausea, vomiting, fever, and chills. Pain is occuring on the left side.   Patient first presented to the emergency department with left flank pain. She underwent a CT scan which showed left sided hydronephrosis and a 1.3 cm stone at the UVJ or possibly in a ureterocele. She is currently asymptomatic. She has not passed a stone. She denies nausea or vomiting. She has never had stones before.      ALLERGIES: None   MEDICATIONS: Simvastatin     GU PSH: None   NON-GU PSH: C-Section    GU PMH: None   NON-GU PMH: Hypercholesterolemia    FAMILY HISTORY: 2 daughters - Daughter Arthritis - Mother Death In The Family Father - Mother, Father Heart Disease - Father, Mother pulmonary fibrosis - Mother   SOCIAL HISTORY: Marital Status: Married Current Smoking Status: Patient does not smoke anymore. Has not smoked since 03/14/1982.   Tobacco Use Assessment Completed: Used Tobacco in last 30 days? Has never drank.  Drinks 4+ caffeinated drinks per day.    REVIEW OF SYSTEMS:    GU Review Female:   Patient reports frequent urination and get up at night to urinate. Patient denies hard to postpone urination, burning /pain with urination, leakage of urine, stream starts and stops, trouble starting your stream, have to strain to urinate, and being pregnant.  Gastrointestinal (Upper):   Patient reports vomiting. Patient denies nausea and indigestion/ heartburn.  Gastrointestinal (Lower):   Patient reports diarrhea and constipation.   Constitutional:   Patient reports fatigue. Patient denies fever, night sweats, and weight loss.  Skin:   Patient denies skin rash/ lesion and itching.  Eyes:   Patient reports blurred vision. Patient denies double  vision.  Ears/ Nose/ Throat:   Patient denies sore throat and sinus problems.  Hematologic/Lymphatic:   Patient denies swollen glands and easy bruising.  Cardiovascular:   Patient denies leg swelling and chest pains.  Respiratory:   Patient denies cough and shortness of breath.  Endocrine:   Patient denies excessive thirst.  Musculoskeletal:   Patient reports back pain. Patient denies joint pain.  Neurological:   Patient denies headaches and dizziness.  Psychologic:   Patient denies depression and anxiety.   VITAL SIGNS:      03/14/2017 12:57 PM  Weight 168 lb / 76.2 kg  Height 63 in / 160.02 cm  BP 143/94 mmHg  Pulse 81 /min  BMI 29.8 kg/m   MULTI-SYSTEM PHYSICAL EXAMINATION:    Constitutional: Well-nourished. No physical deformities. Normally developed. Good grooming.  Neck: Neck symmetrical, not swollen. Normal tracheal position.  Respiratory: No labored breathing, no use of accessory muscles.   Cardiovascular: Normal temperature, normal extremity pulses, no swelling, no varicosities.  Lymphatic: No enlargement of neck, axillae, groin.  Skin: No paleness, no jaundice, no cyanosis. No lesion, no ulcer, no rash.  Neurologic / Psychiatric: Oriented to time, oriented to place, oriented to person. No depression, no anxiety, no agitation.  Gastrointestinal: No mass, no tenderness, no rigidity, non obese abdomen.  Eyes: Normal conjunctivae. Normal eyelids.  Ears, Nose, Mouth, and Throat: Left ear no scars, no lesions, no masses. Right ear no scars, no lesions, no masses. Nose no scars, no lesions, no masses. Normal hearing. Normal  lips.  Musculoskeletal: Normal gait and station of head and neck.     PAST DATA REVIEWED:  Source Of History:  Patient  Records Review:   Previous Hospital Records  X-Ray Review: [facility].T. [facility] Protocol: Reviewed Films. Reviewed Report. Discussed With Patient.     PROCEDURES:          Urinalysis Dipstick Dipstick Cont'd  Color: Yellow Bilirubin: Neg   Appearance: Clear Ketones: Neg  Specific Gravity: 1.025 Blood: Neg  pH: 7.0 Protein: Neg  Glucose: Neg Urobilinogen: 0.2    Nitrites: Neg    Leukocyte Esterase: Neg    ASSESSMENT:      ICD-10 Details  1 GU:   Ureteral calculus - N20.1    PLAN:           Orders Labs Urine Culture          Document Letter(s):  Created for Patient: Clinical Summary    The patient and I talked about surgical treatment for their ureteral calculus. Etiologies of urinary calculi including dehydration, poor fluid intake, intake of well water, congenital renal disease, previous bowel surgery, idiopathic, and others were discussed with the patient.   Metabolic evaluation of ureteral stone disease was discussed with the patient. Alternative treatment options including increased water intake, increased lemonade intake, and dietary moderation with calcium and oxalate containing foods were discussed with the patient in detail.   The risks, benefits, and some of the possible complications of surgical treatments including cystoscopy, ureteroscopy, renoscopy, laser lithotripsy, extracorporeal shock wave lithotripsy, stent insertion and others were discussed with the patient. All questions were answered.  The patient gave fully informed consent to proceed with a ureteroscopy with or without laser lithotripsy with stone extraction for the treatment of their ureteral calculi.   The patient was given instructions to call for abdominal pain, pelvic pain, perirectal pain, nausea, vomiting, diarrhea, fever over 100 degrees F, chills, hematuria, or dysuria.        Notes:   I discussed with the patient that due to the stone size and the fact that possibly a ureterocele that she is unlike the past. She does have a ureterocele, this would make her not a good candidate for lithotripsy. We discussed undergoing cystoscopy, left ureteroscopy, laser lithotripsy, left ureteral stent, and possible incision of ureterocele. We  discussed the risks and benefits of this. She has elected to proceed.

## 2017-05-06 NOTE — Discharge Instructions (Signed)
[facility] °[phone] °Post Ureteroscopy With or Without Stent Instructions ° °Definitions: ° °Ureter: The duct that transports urine from the kidney to the bladder. °Stent:   A plastic hollow tube that is placed into the ureter, from the kidney to the                 bladder to prevent the ureter from swelling shut. ° °GENERAL INSTRUCTIONS: ° °Despite the fact that no skin incisions were used, the area around the ureter and bladder is raw and irritated. The stent is a foreign body which will further irritate the bladder wall. This irritation is manifested by increased frequency of urination, both day and night, and by an increase in the urge to urinate. In some, the urge to urinate is present almost always. Sometimes the urge is strong enough that you may not be able to stop yourself from urinating. The only real cure is to remove the stent and then give time for the bladder wall to heal which can't be done until the danger of the ureter swelling shut has passed, which varies. ° °You may see some blood in your urine while the stent is in place and a few days afterwards. Do not be alarmed, even if the urine was clear for a while. Get off your feet and drink lots of fluids until clearing occurs. If you start to pass clots or don't improve, call us. ° °DIET: °You may return to your normal diet immediately. Because of the raw surface of your bladder, alcohol, spicy foods, acid type foods and drinks with caffeine may cause irritation or frequency and should be used in moderation. To keep your urine flowing freely and to avoid constipation, drink plenty of fluids during the day ( 8-10 glasses ). °Tip: Avoid cranberry juice because it is very acidic. ° °ACTIVITY: °Your physical activity doesn't need to be restricted. However, if you are very active, you may see some blood in your urine. We suggest that you reduce your activity under these circumstances until the bleeding has stopped. ° °BOWELS: °It is  important to keep your bowels regular during the postoperative period. Straining with bowel movements can cause bleeding. A bowel movement every other day is reasonable. Use a mild laxative if needed, such as Milk of Magnesia 2-3 tablespoons, or 2 Dulcolax tablets. Call if you continue to have problems. If you have been taking narcotics for pain, before, during or after your surgery, you may be constipated. Take a laxative if necessary. ° ° °MEDICATION: °You should resume your pre-surgery medications unless told not to. In addition you will often be given an antibiotic to prevent infection. These should be taken as prescribed until the bottles are finished unless you are having an unusual reaction to one of the drugs. ° °PROBLEMS YOU SHOULD REPORT TO US: °· Fevers over 100.5 Fahrenheit. °· Heavy bleeding, or clots ( See above notes about blood in urine ). °· Inability to urinate. °· Drug reactions ( hives, rash, nausea, vomiting, diarrhea ). °· Severe burning or pain with urination that is not improving. ° °FOLLOW-UP: °You will need a follow-up appointment to monitor your progress. Call for this appointment at the number listed above. Usually the first appointment will be about three to fourteen days after your surgery. ° ° ° ° ° °Post Anesthesia Home Care Instructions ° °Activity: °Get plenty of rest for the remainder of the day. A responsible individual must stay with you for 24 hours following the procedure.  °  For the next 24 hours, DO NOT: °-Drive a car °-Operate machinery °-Drink alcoholic beverages °-Take any medication unless instructed by your physician °-Make any legal decisions or sign important papers. ° °Meals: °Start with liquid foods such as gelatin or soup. Progress to regular foods as tolerated. Avoid greasy, spicy, heavy foods. If nausea and/or vomiting occur, drink only clear liquids until the nausea and/or vomiting subsides. Call your physician if vomiting continues. ° °Special  Instructions/Symptoms: °Your throat may feel dry or sore from the anesthesia or the breathing tube placed in your throat during surgery. If this causes discomfort, gargle with warm salt water. The discomfort should disappear within 24 hours. ° °If you had a scopolamine patch placed behind your ear for the management of post- operative nausea and/or vomiting: ° °1. The medication in the patch is effective for 72 hours, after which it should be removed.  Wrap patch in a tissue and discard in the trash. Wash hands thoroughly with soap and water. °2. You may remove the patch earlier than 72 hours if you experience unpleasant side effects which may include dry mouth, dizziness or visual disturbances. °3. Avoid touching the patch. Wash your hands with soap and water after contact with the patch. °  ° °

## 2017-05-06 NOTE — Anesthesia Preprocedure Evaluation (Addendum)
Anesthesia Evaluation  Patient identified by MRN, date of birth, ID band Patient awake    Reviewed: Allergy & Precautions, NPO status , Patient's Chart, lab work & pertinent test results  History of Anesthesia Complications Negative for: history of anesthetic complications  Airway Mallampati: I  TM Distance: >3 FB Neck ROM: Full    Dental  (+) Teeth Intact   Pulmonary neg shortness of breath, neg sleep apnea, neg COPD, neg recent URI, former smoker,    breath sounds clear to auscultation       Cardiovascular (-) hypertension(-) angina(-) Past MI and (-) CHF negative cardio ROS  (-) dysrhythmias (-) Valvular Problems/Murmurs Rhythm:Regular     Neuro/Psych negative neurological ROS  negative psych ROS   GI/Hepatic negative GI ROS, Neg liver ROS,   Endo/Other  negative endocrine ROS  Renal/GU      Musculoskeletal  (+) Arthritis ,   Abdominal   Peds  Hematology negative hematology ROS (+)   Anesthesia Other Findings   Reproductive/Obstetrics                            Anesthesia Physical Anesthesia Plan  ASA: I  Anesthesia Plan: General   Post-op Pain Management:    Induction: Intravenous  PONV Risk Score and Plan: 3 and Ondansetron, Dexamethasone, Propofol and Midazolam  Airway Management Planned: LMA and Oral ETT  Additional Equipment: None  Intra-op Plan:   Post-operative Plan: Extubation in OR  Informed Consent: I have reviewed the patients History and Physical, chart, labs and discussed the procedure including the risks, benefits and alternatives for the proposed anesthesia with the patient or authorized representative who has indicated his/her understanding and acceptance.   Dental advisory given  Plan Discussed with: CRNA and Surgeon  Anesthesia Plan Comments:         Anesthesia Quick Evaluation

## 2017-05-06 NOTE — Anesthesia Procedure Notes (Signed)
Procedure Name: LMA Insertion Date/Time: 05/06/2017 12:25 PM Performed by: Bethena Roys T Pre-anesthesia Checklist: Patient identified, Emergency Drugs available, Suction available and Patient being monitored Patient Re-evaluated:Patient Re-evaluated prior to induction Oxygen Delivery Method: Circle system utilized Preoxygenation: Pre-oxygenation with 100% oxygen Induction Type: IV induction Ventilation: Mask ventilation without difficulty LMA: LMA inserted LMA Size: 4.0 Number of attempts: 1 Airway Equipment and Method: Bite block Placement Confirmation: positive ETCO2 Tube secured with: Tape Dental Injury: Teeth and Oropharynx as per pre-operative assessment

## 2017-05-06 NOTE — Transfer of Care (Signed)
Immediate Anesthesia Transfer of Care Note  Patient: Tina Aguilar  Procedure(s) Performed: Procedure(s): CYSTOSCOPY/URETEROSCOPY/HOLMIUM LASER/STENT PLACEMENT (Left)  Patient Location: PACU  Anesthesia Type:General  Level of Consciousness: drowsy and responds to stimulation  Airway & Oxygen Therapy: Patient Spontanous Breathing and Patient connected to nasal cannula oxygen  Post-op Assessment: Report given to RN  Post vital signs: Reviewed and stable  Last Vitals: 167/98, 84, 12, 99%, 96.8  Vitals:   05/06/17 1029  BP: (!) 143/87  Pulse: 75  Resp: 16  Temp: 36.7 C    Last Pain:  Vitals:   05/06/17 1029  TempSrc: Oral      Patients Stated Pain Goal: 8 (83/33/83 2919)  Complications: No apparent anesthesia complications

## 2017-05-06 NOTE — Op Note (Signed)
Date of procedure: 05/06/17  Preoperative diagnosis:  1. Left ureteral stone   Postoperative diagnosis:  1. Impacted left ureteral stone   Procedure: 1. Cystoscopy 2. Left ureteroscopy 3. Incision of left ureteral orifice with laser 4. Laser lithotripsy 5. Stone manipulation 6. Left retrograde pyelogram with interpretation 7. Left ureteral stent placement 6 French by 26 cm  Surgeon: Baruch Gouty, MD  Anesthesia: General  Complications: None  Intraoperative findings: The patient had an impacted left UVJ stone approximately 1 cm in size. I was unable to place a sensor wire past it because it was impacted in the ureteral orifice was not visible. However, the wall was thinned from the impaction he could see the yellow stone beneath a thin layer mucosal. The mucosa was then incised with the laser until the stone was seen this point a safety wire was placed, and the stone was broken into small places and removed. A left retrograde pyelogram showed no filling defects or hydronephrosis at the end of the procedure.  EBL: None  Specimens: Stone to office lab for analysis  Drains: Left 6 French by 26 cm double-J ureteral stent  Disposition: Stable to the postanesthesia care unit  Indication for procedure: The patient is a 61 y.o. female with a left 1 severe UVJ stone that is either impacted her within a ureterocele presents today for definitive surgical management.  After reviewing the management options for treatment, the patient elected to proceed with the above surgical procedure(s). We have discussed the potential benefits and risks of the procedure, side effects of the proposed treatment, the likelihood of the patient achieving the goals of the procedure, and any potential problems that might occur during the procedure or recuperation. Informed consent has been obtained.  Description of procedure: The patient was met in the preoperative area. All risks, benefits, and indications of the  procedure were described in great detail. The patient consented to the procedure. Preoperative antibiotics were given. The patient was taken to the operative theater. General anesthesia was induced per the anesthesia service. The patient was then placed in the dorsal lithotomy position and prepped and draped in the usual sterile fashion. A preoperative timeout was called.   A 21 French 30 cystoscope was inserted into the patient's bladder per urethra atraumatically. A bulge was seen at the expected location of the left ureteral orifice. The left ureteral orifice could not however be seen. The bulge was consistent with a stone. On close inspection there is a thin layer of mucosa and he could see the yellow color of the stone beneath it. Attempt was made to place a sensor wire with both the cystoscope and a semirigid ureteroscope. This was unsuccessful. Using the semirigid ureteroscope, though through the thin mucosa the yellow color of the stone to be seen as well as it could be felt with gentle pressure on the ureteroscope on this area. At this point the decision was made to incise over the stone as the stone was impacted in the ureteral orifice was not visible. An incision was made with with the laser and immediately upon lasering the mucosa of the stone was easily visible broken into 2 pieces and the ureter was now visible. A safety sensor wire was then placed. The stone was broken into smaller pieces with laser lithotripsy which now easily floated out of the left ureter into the bladder without a stone basket. Gentle ventilation ureteroscope cause passive passing remaining fragments. Pain ureteroscopy of the left status point revealed no further fragments.  The left ureteral orifice wide open and patent. The left retro-powder was obtained to identify the collecting system which showed no filling defects or hydronephrosis were digitally. The cystoscope was reassembled over the sensor wire a 6 [place] by 26 cm  double-J ureteral stent was placed. The sensor wire was removed. A curl seen in the patient's renal pelvis on fluoroscopy and the urinary bladder under visualization. Stone firings were then evacuated from the bladder and sent to pathology. The patient was then woken from anesthesia and transferred in stable condition to the postanesthesia care unit.  Plan: The patient will follow-up in one week to have her stent removed in the office. She will need a renal ultrasound in 1 month to rule out iatrogenic hydronephrosis. She is at risk in the future from reflux due to this impacted stone.  Baruch Gouty, M.D.

## 2017-05-07 ENCOUNTER — Encounter (HOSPITAL_BASED_OUTPATIENT_CLINIC_OR_DEPARTMENT_OTHER): Payer: Self-pay | Admitting: Urology

## 2017-05-13 ENCOUNTER — Other Ambulatory Visit: Payer: Self-pay | Admitting: Family Medicine

## 2017-05-13 ENCOUNTER — Ambulatory Visit: Payer: Federal, State, Local not specified - PPO | Admitting: Family Medicine

## 2017-05-13 DIAGNOSIS — Z1231 Encounter for screening mammogram for malignant neoplasm of breast: Secondary | ICD-10-CM

## 2017-05-16 ENCOUNTER — Other Ambulatory Visit: Payer: Self-pay | Admitting: Internal Medicine

## 2017-05-16 ENCOUNTER — Other Ambulatory Visit: Payer: Self-pay | Admitting: Family Medicine

## 2017-05-19 ENCOUNTER — Other Ambulatory Visit: Payer: Self-pay | Admitting: Family Medicine

## 2017-05-20 ENCOUNTER — Ambulatory Visit: Payer: Federal, State, Local not specified - PPO | Admitting: Family Medicine

## 2017-05-27 ENCOUNTER — Ambulatory Visit (INDEPENDENT_AMBULATORY_CARE_PROVIDER_SITE_OTHER): Payer: BC Managed Care – PPO | Admitting: Family Medicine

## 2017-05-27 ENCOUNTER — Ambulatory Visit
Admission: RE | Admit: 2017-05-27 | Discharge: 2017-05-27 | Disposition: A | Discharge status: Home / Self Care | Payer: BC Managed Care – PPO | Source: Ambulatory Visit | Attending: Family Medicine | Admitting: Family Medicine

## 2017-05-27 ENCOUNTER — Encounter: Payer: Self-pay | Admitting: Family Medicine

## 2017-05-27 VITALS — BP 130/78 | HR 79 | Temp 98.2°F | Ht 63.0 in | Wt 170.2 lb

## 2017-05-27 DIAGNOSIS — D229 Melanocytic nevi, unspecified: Secondary | ICD-10-CM

## 2017-05-27 DIAGNOSIS — E785 Hyperlipidemia, unspecified: Secondary | ICD-10-CM

## 2017-05-27 DIAGNOSIS — Z23 Encounter for immunization: Secondary | ICD-10-CM | POA: Diagnosis not present

## 2017-05-27 DIAGNOSIS — Z1231 Encounter for screening mammogram for malignant neoplasm of breast: Secondary | ICD-10-CM

## 2017-05-27 DIAGNOSIS — R519 Headache, unspecified: Secondary | ICD-10-CM | POA: Insufficient documentation

## 2017-05-27 DIAGNOSIS — F50819 Binge eating disorder, unspecified: Secondary | ICD-10-CM

## 2017-05-27 DIAGNOSIS — R51 Headache: Secondary | ICD-10-CM

## 2017-05-27 DIAGNOSIS — F5081 Binge eating disorder: Secondary | ICD-10-CM

## 2017-05-27 MED ORDER — FENOFIBRATE MICRONIZED 130 MG PO CAPS: 130.0000 mg | ORAL_CAPSULE | Freq: Every day | ORAL | 3 refills | Status: DC

## 2017-05-27 MED ORDER — SIMVASTATIN 20 MG PO TABS: 20.0000 mg | ORAL_TABLET | Freq: Every day | ORAL | 3 refills | Status: DC

## 2017-05-27 NOTE — Progress Notes (Signed)
Tina Aguilar is a 61 y.o. female is here to [place].   Patient Care Team: Briscoe Deutscher, DO as PCP - General (Family Medicine)   History of Present Illness:   Tina Aguilar, CMA, acting as scribe for Dr. Juleen China.  HPI:  Patient comes in today to establish care.  She would like refills on her cholesterol medications.  States she ran out of her cholesterol medication and has not been taking it.  Headache:  States she has had a headache since she had a ureteral stent placed on 05/06/2017.  States the headache was severe at first and was occurring daily.  It has lessened in severity and frequency now.  States the last time she had a headache was this morning when she woke up.  States it was so intense following her surgery "she thought she was having a stroke".  No difficulty speaking or swallowing.  No numbness or tingling.  No difficulty walking or moving.    Weight:  Patient states she has difficulty controlling her eating.  States she will try to eat healthy and then she will binge eat.  States she needs someone to tell her what she can eat and what she can't eat.  States she will be very good about controlling her diet for 3-4 days.  The next day she will be craving pizza and will buy a pizza with the intention of eating one slice.  She will then eat the entire pizza.  Then she will buy 2 or 3 candy bars and eat all of them.  States that when she was a child her mother was very strict with food and food was scarce.  She feels her binge eating stems from this.  Will refer to nutrition.    Mole:  Patient has a mole on her upper left abdomen.  States it is "more raised that before" and she would like to have it removed.  Will have patient schedule appointment to have this removed.  Health Maintenance Due  Topic Date Due  . Hepatitis C Screening  [birth year]  . HIV Screening  04/25/1970   Depression screen PHQ 2/9 05/27/2017  Decreased Interest 0  Down, Depressed, Hopeless 0    PHQ - 2 Score 0   PMHx, SurgHx, SocialHx, Medications, and Allergies were reviewed in the Visit Navigator and updated as appropriate.   Past Medical History:  Diagnosis Date  . Arthritis    hips, hands  . Frequency of urination   . History of adenomatous polyp of colon 08/28/2005  . Hyperlipidemia, mixed   . Left ureteral stone   . Wears glasses    Past Surgical History:  Procedure Laterality Date  . CESAREAN SECTION  1991;  1993  . COLONOSCOPY  last one 11-23-2010  . CYSTOSCOPY/URETEROSCOPY/HOLMIUM LASER/STENT PLACEMENT Left 05/06/2017   Procedure: CYSTOSCOPY/URETEROSCOPY/HOLMIUM LASER/STENT PLACEMENT;  Surgeon: Nickie Retort, MD;  Location: [facility];  Service: Urology;  Laterality: Left;  . DILATION AND CURETTAGE OF UTERUS  1980's   w/ Suction for miscarriage  x2   Family History  Problem Relation Age of Onset  . Coronary artery disease Mother   . Arthritis Mother   . Congestive Heart Failure Father   . Heart attack Father   . Other Brother        coronary artery bypass graft age 81  . Hyperlipidemia Brother   . Diabetes Brother   . Hyperlipidemia Sister   . Diabetes Sister   . Heart disease  Sister   . Hyperlipidemia Sister   . Hyperlipidemia Sister   . Heart attack Maternal Aunt        at age 46   Social History  Substance Use Topics  . Smoking status: Former Smoker    Packs/day: 1.00    Years: 5.00    Types: Cigarettes    Quit date: 04/30/1977  . Smokeless tobacco: Never Used  . Alcohol use No   Current Medications and Allergies:   .  Ascorbic Acid (VITAMIN C GUMMIE PO), Take by mouth daily., Disp: , Rfl:  .  Cholecalciferol (VITAMIN D3 GUMMIES ADULT PO), Take by mouth daily., Disp: , Rfl:  .  Coenzyme Q10 (COQ10 GUMMIES ADULT PO), Take by mouth daily., Disp: , Rfl:  .  Ibuprofen (ADVIL) 200 MG CAPS, Take by mouth as needed., Disp: , Rfl:  .  Multiple Vitamins-Minerals (MULTIVITAMIN GUMMIES WOMENS PO), Take by mouth daily., Disp: ,  Rfl:  .  fenofibrate micronized (ANTARA) 130 MG capsule, Take 1 capsule (130 mg total) by mouth daily before breakfast., Disp: 90 capsule, Rfl: 3 .  simvastatin (ZOCOR) 20 MG tablet, Take 1 tablet (20 mg total) by mouth daily at 6 PM., Disp: 90 tablet, Rfl: 3  No Known Allergies   Review of Systems:   Pertinent items are noted in the HPI. Otherwise, ROS is negative.  Vitals:   Vitals:   05/27/17 1012  BP: 130/78  Pulse: 79  Temp: 98.2 F (36.8 C)  TempSrc: Oral  SpO2: 98%  Weight: 170 lb 3.2 oz (77.2 kg)  Height: 5\' 3"  (1.6 m)     Body mass index is 30.15 kg/m. Physical Exam:   Physical Exam  Constitutional: She appears well-nourished.  HENT:  Head: Normocephalic and atraumatic.  Eyes: Pupils are equal, round, and reactive to light. EOM are normal.  Neck: Normal range of motion. Neck supple.  Cardiovascular: Normal rate, regular rhythm, normal heart sounds and intact distal pulses.   Pulmonary/Chest: Effort normal.  Abdominal: Soft.  Skin: Skin is warm.  Mole on flank.  Psychiatric: She has a normal mood and affect. Her behavior is normal.  Nursing note and vitals reviewed.  Lab Results  Component Value Date   CHOL 199 10/16/2016   HDL 78 10/16/2016   LDLCALC 108 (H) 10/16/2016   LDLDIRECT 133.6 04/27/2013   TRIG 65 10/16/2016   CHOLHDL 2.6 10/16/2016   Assessment and Plan:   Tina Aguilar was seen today for establish care.  Diagnoses and all orders for this visit:  Hyperlipidemia, unspecified hyperlipidemia type Comments: Continue current treatment.  Orders: -     simvastatin (ZOCOR) 20 MG tablet; Take 1 tablet (20 mg total) by mouth daily at 6 PM. -     fenofibrate micronized (ANTARA) 130 MG capsule; Take 1 capsule (130 mg total) by mouth daily before breakfast.  Binge eating disorder Comments: Patient interested in treatment. Will refer to Nutrition.   Change in mole Comments: Scheduled for removal.  Need for shingles vaccine -     Varicella-zoster  vaccine IM (Shingrix)   . Reviewed expectations re: course of current medical issues. . Discussed self-management of symptoms. . Outlined signs and symptoms indicating need for more acute intervention. . Patient verbalized understanding and all questions were answered. Marland Kitchen Health Maintenance issues including appropriate healthy diet, exercise, and smoking avoidance were discussed with patient. . See orders for this visit as documented in the electronic medical record. . Patient received an After Visit Summary.  CMA served as  scribe during this visit. History, Physical, and Plan performed by medical provider. The above documentation has been reviewed and is accurate and complete. Briscoe Deutscher, D.O.  Briscoe Deutscher, DO [place] 06/06/2017  Future Appointments Date Time Provider [place]  08/22/2017 9:45 AM Gildardo Cranker, DO [facility] None

## 2017-05-27 NOTE — Patient Instructions (Signed)
Schedule appointment for mole removal.

## 2017-05-28 ENCOUNTER — Encounter: Payer: Self-pay | Admitting: Family Medicine

## 2017-05-28 ENCOUNTER — Ambulatory Visit (INDEPENDENT_AMBULATORY_CARE_PROVIDER_SITE_OTHER): Payer: BC Managed Care – PPO | Admitting: Family Medicine

## 2017-05-28 VITALS — BP 152/88 | HR 97 | Ht 63.0 in | Wt 170.0 lb

## 2017-05-28 DIAGNOSIS — D229 Melanocytic nevi, unspecified: Secondary | ICD-10-CM

## 2017-05-28 NOTE — Patient Instructions (Signed)
DSM-5: Binge Eating Disorder  The key diagnostic features of BED are: 1. Recurrent and persistent episodes of binge eating 2. Binge eating episodes are associated with three (or more) of the following:  Eating much more rapidly than normal  Eating until feeling uncomfortably full  Eating large amounts of food when not feeling physically hungry  Eating alone because of being embarrassed by how much one is eating  Feeling disgusted with oneself, depressed, or very guilty after overeating 3. Marked distress regarding binge eating 4. Absence of regular compensatory behaviors (such as purging).

## 2017-05-28 NOTE — Progress Notes (Signed)
Tina Aguilar is a 61 y.o. female is here for follow up.  History of Present Illness:  Shaune Pascal CMA acting as scribe for Dr. Juleen China.  HPI: Patient comes in today to have a mole removed on left side of belly.   Health Maintenance Due  Topic Date Due  . Hepatitis C Screening  11/16/1955  . HIV Screening  04/25/1970   Depression screen [facility] 2/9 05/27/2017 10/16/2016 05/25/2014  Decreased Interest 0 0 0  Down, Depressed, Hopeless 0 0 0  PHQ - 2 Score 0 0 0   PMHx, SurgHx, SocialHx, FamHx, Medications, and Allergies were reviewed in the Visit Navigator and updated as appropriate.   Patient Active Problem List   Diagnosis Date Noted  . Binge eating disorder 05/27/2017  . Headache 05/27/2017  . Change in mole 05/27/2017  . Myalgia and myositis 05/25/2014  . Routine general medical examination at a health care facility 05/25/2014  . Routine health maintenance 09/04/2011  . COLONIC POLYPS 08/30/2010  . Hyperlipidemia 06/30/2007  . PAIN IN JOINT, UNSPECIFIED SITE 06/30/2007   Social History  Substance Use Topics  . Smoking status: Former Smoker    Packs/day: 1.00    Years: 5.00    Types: Cigarettes    Quit date: 04/30/1977  . Smokeless tobacco: Never Used  . Alcohol use No   Current Medications and Allergies:   Current Outpatient Prescriptions:  .  Ascorbic Acid (VITAMIN C GUMMIE PO), Take by mouth daily., Disp: , Rfl:  .  Cholecalciferol (VITAMIN D3 GUMMIES ADULT PO), Take by mouth daily., Disp: , Rfl:  .  Coenzyme Q10 (COQ10 GUMMIES ADULT PO), Take by mouth daily., Disp: , Rfl:  .  fenofibrate micronized (ANTARA) 130 MG capsule, Take 1 capsule (130 mg total) by mouth daily before breakfast., Disp: 90 capsule, Rfl: 3 .  Ibuprofen (ADVIL) 200 MG CAPS, Take by mouth as needed., Disp: , Rfl:  .  Multiple Vitamins-Minerals (MULTIVITAMIN GUMMIES WOMENS PO), Take by mouth daily., Disp: , Rfl:  .  simvastatin (ZOCOR) 20 MG tablet, Take 1 tablet (20 mg total) by mouth  daily at 6 PM., Disp: 90 tablet, Rfl: 3  No Known Allergies Review of Systems   Pertinent items are noted in the HPI. Otherwise, ROS is negative.  Vitals:   Vitals:   05/28/17 0841  BP: (!) 152/88  Pulse: 97  SpO2: 98%  Weight: 170 lb (77.1 kg)  Height: 5\' 3"  (1.6 m)     Body mass index is 30.11 kg/m. Physical Exam:   Physical Exam  Abdominal:    Irritated mole, left belt-line.     Assessment and Plan:   Diagnoses and all orders for this visit:  Change in mole Comments: Irritated. Likely due to location. Shave excision today. Path pending.  Orders: -     Dermatology pathology  Procedure Note:  Procedure: Skin biopsy Indication: Changing mole  Risks including unsuccessful procedure , bleeding, infection, bruising, scar, a need for another complete procedure and others were explained to the patient in detail as well as the benefits. Informed consent was obtained and signed.   The patient was placed in a decubitus position.  Lesion #1 on left flank  measuring  2-3  mm  Skin over lesion #1 was prepped with Betadine and alcohol  and anesthetized with 1 cc of 1% lidocaine, using a 25-gauge 1 inch needle.  Shave biopsy with a sterile Dermablade was carried out in the usual fashion. Hyfrecator was used to destroy  the rest of the lesion potentially left behind and for hemostasis. Band-Aid was applied with antibiotic ointment.  . Reviewed expectations re: course of current medical issues. . Discussed self-management of symptoms. . Outlined signs and symptoms indicating need for more acute intervention. . Patient verbalized understanding and all questions were answered. Marland Kitchen Health Maintenance issues including appropriate healthy diet, exercise, and smoking avoidance were discussed with patient. . See orders for this visit as documented in the electronic medical record. . Patient received an After Visit Summary.  CMA served as Education administrator during this visit. History, Physical,  and Plan performed by medical provider. The above documentation has been reviewed and is accurate and complete. Briscoe Deutscher, D.O.  Briscoe Deutscher, DO [place] 05/28/2017  Future Appointments Date Time Provider [place]  08/22/2017 9:45 AM Gildardo Cranker, DO [facility] None

## 2017-08-22 ENCOUNTER — Encounter: Payer: Federal, State, Local not specified - PPO | Admitting: Internal Medicine

## 2017-11-13 ENCOUNTER — Ambulatory Visit (INDEPENDENT_AMBULATORY_CARE_PROVIDER_SITE_OTHER): Payer: BC Managed Care – PPO | Admitting: Surgical

## 2017-11-13 ENCOUNTER — Encounter: Payer: Self-pay | Admitting: Surgical

## 2017-11-13 DIAGNOSIS — Z23 Encounter for immunization: Secondary | ICD-10-CM | POA: Diagnosis not present

## 2017-11-13 NOTE — Progress Notes (Signed)
I have reviewed the patient's encounter and agree with the documentation.  Tina Aguilar. Jerline Pain, MD 11/13/2017 12:40 PM

## 2017-11-13 NOTE — Progress Notes (Signed)
Patient came in today for second Shingrix injection. Patient tolerated well. Injection given in right deltoid

## 2018-02-08 NOTE — Progress Notes (Signed)
Tina Aguilar is a 62 y.o. female is here for follow up.   History of Present Illness:    Tina Aguilar, CMA acting as scribe for Dr. Briscoe Deutscher.   HPI:  Patient in today for arthritis pain in hips. Hard for her to stand first thing in the morning. She has been using over the counter medications but not as helpful now.   Hands: She also admits to having pain in hands and wrist. She is not having symptoms today. She has increased pain when she has been setting for a while.  She has been wearing a brace on both hands and has noticed some improvement.   Hips: She is having bilateral hip pain that has increased. She would like to try some mediations for inflammation.   Health Maintenance Due  Topic Date Due  . Hepatitis C Screening  10-10-1956  . HIV Screening  04/25/1970   Depression screen [facility] 2/9 05/27/2017 10/16/2016 05/25/2014  Decreased Interest 0 0 0  Down, Depressed, Hopeless 0 0 0  PHQ - 2 Score 0 0 0   PMHx, SurgHx, SocialHx, FamHx, Medications, and Allergies were reviewed in the Visit Navigator and updated as appropriate.   Patient Active Problem List   Diagnosis Date Noted  . Binge eating disorder 05/27/2017  . Headache 05/27/2017  . Colon polyps 08/30/2010  . Hyperlipidemia 06/30/2007   Social History   Tobacco Use  . Smoking status: Former Smoker    Packs/day: 1.00    Years: 5.00    Pack years: 5.00    Types: Cigarettes    Last attempt to quit: 04/30/1977    Years since quitting: 40.8  . Smokeless tobacco: Never Used  Substance Use Topics  . Alcohol use: No    Alcohol/week: 0.0 oz  . Drug use: No   Current Medications and Allergies:   Current Outpatient Medications:  .  Ascorbic Acid (VITAMIN C GUMMIE PO), Take by mouth daily., Disp: , Rfl:  .  Cholecalciferol (VITAMIN D3 GUMMIES ADULT PO), Take by mouth daily., Disp: , Rfl:  .  Coenzyme Q10 (COQ10 GUMMIES ADULT PO), Take by mouth daily., Disp: , Rfl:  .  fenofibrate micronized (ANTARA)  130 MG capsule, Take 1 capsule (130 mg total) by mouth daily before breakfast., Disp: 90 capsule, Rfl: 3 .  Ibuprofen (ADVIL) 200 MG CAPS, Take by mouth as needed., Disp: , Rfl:  .  Multiple Vitamins-Minerals (MULTIVITAMIN GUMMIES WOMENS PO), Take by mouth daily., Disp: , Rfl:  .  simvastatin (ZOCOR) 20 MG tablet, Take 1 tablet (20 mg total) by mouth daily at 6 PM., Disp: 90 tablet, Rfl: 3  No Known Allergies   Review of Systems   Pertinent items are noted in the HPI. Otherwise, ROS is negative.  Vitals:   Vitals:   02/09/18 1028 02/09/18 1055  BP: (!) 154/86 140/82  Pulse: 79   Temp: 98.5 F (36.9 C)   TempSrc: Oral   SpO2: 98%   Weight: 168 lb 6.4 oz (76.4 kg)   Height: 5\' 3"  (1.6 m)      Body mass index is 29.83 kg/m.   Physical Exam:   Physical Exam  Constitutional: She is oriented to person, place, and time. She appears well-developed and well-nourished. No distress.  HENT:  Head: Normocephalic and atraumatic.  Right Ear: External ear normal.  Left Ear: External ear normal.  Nose: Nose normal.  Mouth/Throat: Oropharynx is clear and moist.  Eyes: Pupils are equal, round, and reactive to  light. Conjunctivae and EOM are normal.  Neck: Normal range of motion. Neck supple. No thyromegaly present.  Cardiovascular: Normal rate, regular rhythm, normal heart sounds and intact distal pulses.  Pulmonary/Chest: Effort normal and breath sounds normal.  Abdominal: Soft. Bowel sounds are normal.  Musculoskeletal: Normal range of motion.  Lymphadenopathy:    She has no cervical adenopathy.  Neurological: She is alert and oriented to person, place, and time.  Skin: Skin is warm and dry. Capillary refill takes less than 2 seconds.  Psychiatric: She has a normal mood and affect. Her behavior is normal.  Nursing note and vitals reviewed.   Assessment and Plan:   Renee was seen today for arthritis.  Diagnoses and all orders for this visit:  Pain of both hip joints -      meloxicam (MOBIC) 15 MG tablet; Take 1 tablet (15 mg total) by mouth daily.  Encounter for hepatitis C screening test for low risk patient -     Hepatitis C antibody; Future  Encounter for screening for HIV -     HIV antibody; Future    . Reviewed expectations re: course of current medical issues. . Discussed self-management of symptoms. . Outlined signs and symptoms indicating need for more acute intervention. . Patient verbalized understanding and all questions were answered. Marland Kitchen Health Maintenance issues including appropriate healthy diet, exercise, and smoking avoidance were discussed with patient. . See orders for this visit as documented in the electronic medical record. . Patient received an After Visit Summary.  Briscoe Deutscher, DO [place] 02/09/2018  No future appointments.

## 2018-02-09 ENCOUNTER — Ambulatory Visit (INDEPENDENT_AMBULATORY_CARE_PROVIDER_SITE_OTHER): Payer: BC Managed Care – PPO | Admitting: Family Medicine

## 2018-02-09 ENCOUNTER — Encounter: Payer: Self-pay | Admitting: Family Medicine

## 2018-02-09 VITALS — BP 140/82 | HR 79 | Temp 98.5°F | Ht 63.0 in | Wt 168.4 lb

## 2018-02-09 DIAGNOSIS — Z114 Encounter for screening for human immunodeficiency virus [HIV]: Secondary | ICD-10-CM | POA: Diagnosis not present

## 2018-02-09 DIAGNOSIS — M25551 Pain in right hip: Secondary | ICD-10-CM

## 2018-02-09 DIAGNOSIS — M25552 Pain in left hip: Secondary | ICD-10-CM | POA: Diagnosis not present

## 2018-02-09 DIAGNOSIS — Z1159 Encounter for screening for other viral diseases: Secondary | ICD-10-CM | POA: Diagnosis not present

## 2018-02-09 MED ORDER — MELOXICAM 15 MG PO TABS: 15.0000 mg | ORAL_TABLET | Freq: Every day | ORAL | 1 refills | Status: DC

## 2018-02-09 NOTE — Patient Instructions (Signed)
Turmeric 1000mg  daily

## 2018-02-19 ENCOUNTER — Ambulatory Visit: Payer: Self-pay | Admitting: *Deleted

## 2018-02-19 NOTE — Telephone Encounter (Signed)
See note

## 2018-02-19 NOTE — Telephone Encounter (Signed)
Pt believes that she had some side effects from the medication meloxicam because last night she had some nausea and burning in her throat it lasted about and 1hr and 1/2   Attempted to call pt back regarding symptoms. Left message for pt to call office back to discuss symptoms.

## 2018-02-20 ENCOUNTER — Ambulatory Visit: Payer: Self-pay | Admitting: *Deleted

## 2018-02-20 NOTE — Telephone Encounter (Signed)
Please advise. Do you want the patient to stop medication.

## 2018-02-20 NOTE — Telephone Encounter (Signed)
Likely not drug allergy. Possible reflux or URI? Make sure she is taking Omeprazole or Zantac or both. If not improving, needs to be seen.

## 2018-02-20 NOTE — Telephone Encounter (Signed)
Pt states she has been taking Mobic as ordered and it has been effective for her arthritic pain control.  States she developed "Mild nausea, no emesis and 2 nights ago "Sore, burning throat, difficulty swallowing (due to soreness) that resolved after 1 to 1- 1/2 hours. Pt. did not note any swelling of throat.  Denies any SOB, rash. Pt states she thinks this is from the Mobic and is questioning if she should try 1/2 tab or different medication. States "Mobic has really helped."   Please advise: [phone] Reason for Disposition . [1] Caller requests to speak ONLY to PCP AND [2] NON-URGENT question  Answer Assessment - Initial Assessment Questions 1. REASON FOR CALL or QUESTION: "What is your reason for calling today?" or "How can I best help you?" or "What question do you have that I can help answer?"     "I think I had a reaction to the Mobic." 2. CALLER: Document the source of call. (e.g., laboratory, patient).     Patient  Protocols used: PCP CALL - NO TRIAGE-A-AH

## 2018-02-20 NOTE — Telephone Encounter (Signed)
Spoke with patient and she stated that she stopped the Mobic yesterday. She has not had the symptoms since she stopped. I told her that if she starts having symptoms again to call and schedule an appointment.

## 2018-02-20 NOTE — Telephone Encounter (Signed)
See note

## 2018-03-30 ENCOUNTER — Other Ambulatory Visit: Payer: Self-pay | Admitting: Family Medicine

## 2018-03-30 DIAGNOSIS — Z1231 Encounter for screening mammogram for malignant neoplasm of breast: Secondary | ICD-10-CM

## 2018-03-31 ENCOUNTER — Other Ambulatory Visit: Payer: Self-pay | Admitting: Family Medicine

## 2018-03-31 DIAGNOSIS — M25551 Pain in right hip: Secondary | ICD-10-CM

## 2018-03-31 DIAGNOSIS — M25552 Pain in left hip: Principal | ICD-10-CM

## 2018-03-31 NOTE — Telephone Encounter (Signed)
Copied from [place] [id number]. Topic: Quick Communication - See Telephone Encounter >> Mar 31, 2018 12:45 PM Hewitt Shorts wrote: Pt is needing a refill of meloxicam   [facility] [address]   [employer] number [phone]

## 2018-04-01 MED ORDER — MELOXICAM 15 MG PO TABS: 15.0000 mg | ORAL_TABLET | Freq: Every day | ORAL | 1 refills | Status: DC

## 2018-04-01 NOTE — Telephone Encounter (Signed)
Ok to refill 

## 2018-04-01 NOTE — Telephone Encounter (Signed)
Request for Meloxicam refill; last refill 02/09/18; # 30; RF x 1  Last OV 02/09/18  PCP: Dr. Juleen China  [facility] [address].   Did not refill as unsure if Dr. Juleen China intends for pt. To continue this medication.

## 2018-04-01 NOTE — Telephone Encounter (Signed)
See note

## 2018-04-16 NOTE — Progress Notes (Signed)
Subjective:   Water quality scientist, CMA acting as scribe for Briscoe Deutscher, DO  Tina Aguilar is a 62 y.o. female and is here for a comprehensive physical exam.  Patient states she is doing well overall.  She would like to have a breast exam and a Pap today.  She would also like to discuss her cholesterol medications today.  She states that simvastatin and fenofibrate cause her heart to pound.  She was taking Vytorin in the past, which caused her joint pain.  She would like to discuss changing her medication today.  No other concerns or complaints today.    Health Maintenance Due  Topic Date Due  . Hepatitis C Screening  [birth year]  . HIV Screening  04/[birth year]/1970     Current Outpatient Medications:  .  Ascorbic Acid (VITAMIN C GUMMIE PO), Take by mouth daily., Disp: , Rfl:  .  Cholecalciferol (VITAMIN D3 GUMMIES ADULT PO), Take by mouth daily., Disp: , Rfl:  .  Coenzyme Q10 (COQ10 GUMMIES ADULT PO), Take by mouth daily., Disp: , Rfl:  .  Ibuprofen (ADVIL) 200 MG CAPS, Take by mouth as needed., Disp: , Rfl:  .  Multiple Vitamins-Minerals (MULTIVITAMIN GUMMIES WOMENS PO), Take by mouth daily., Disp: , Rfl:  .  ezetimibe-simvastatin (VYTORIN) 10-10 MG tablet, Take 1 tablet by mouth at bedtime., Disp: 90 tablet, Rfl: 3 .  meloxicam (MOBIC) 15 MG tablet, Take 1 tablet (15 mg total) by mouth daily., Disp: 30 tablet, Rfl: 1  PMHx, SurgHx, SocialHx, Medications, and Allergies were reviewed in the Visit Navigator and updated as appropriate.   Past Medical History:  Diagnosis Date  . Arthritis    hips, hands  . Frequency of urination   . History of adenomatous polyp of colon 08/28/2005  . Hyperlipidemia, mixed   . Left ureteral stone   . Wears glasses     Past Surgical History:  Procedure Laterality Date  . CESAREAN SECTION  1991;  1993  . COLONOSCOPY  last one 11-23-2010  . CYSTOSCOPY/URETEROSCOPY/HOLMIUM LASER/STENT PLACEMENT Left 05/06/2017   Procedure:  CYSTOSCOPY/URETEROSCOPY/HOLMIUM LASER/STENT PLACEMENT;  Surgeon: Nickie Retort, MD;  Location: [facility];  Service: Urology;  Laterality: Left;  . DILATION AND CURETTAGE OF UTERUS  1980's   w/ Suction for miscarriage  x2    Family History  Problem Relation Age of Onset  . Coronary artery disease Mother   . Arthritis Mother   . Congestive Heart Failure Father   . Heart attack Father   . Other Brother        coronary artery bypass graft age 66  . Hyperlipidemia Brother   . Diabetes Brother   . Hyperlipidemia Sister   . Diabetes Sister   . Heart disease Sister   . Hyperlipidemia Sister   . Hyperlipidemia Sister   . Heart attack Maternal Aunt        at age 34   Social History   Tobacco Use  . Smoking status: Former Smoker    Packs/day: 1.00    Years: 5.00    Pack years: 5.00    Types: Cigarettes    Last attempt to quit: 04/30/1977    Years since quitting: 40.9  . Smokeless tobacco: Never Used  Substance Use Topics  . Alcohol use: No    Alcohol/week: 0.0 oz  . Drug use: No    Review of Systems:   Pertinent items are noted in the HPI. Otherwise, ROS is negative.  Objective:   BP 134/78 (  BP Location: Left Arm, Patient Position: Sitting, Cuff Size: Normal)   Pulse 64   Temp 98.5 F (36.9 C) (Oral)   Ht 5\' 3"  (1.6 m)   Wt 160 lb 6.4 oz (72.8 kg)   SpO2 98%   BMI 28.41 kg/m   General appearance: alert, cooperative and appears stated age. Head: normocephalic, without obvious abnormality, atraumatic. Neck: no adenopathy, supple, symmetrical, trachea midline; thyroid not enlarged, symmetric, no tenderness/mass/nodules. Lungs: clear to auscultation bilaterally. Breasts: inspection negative, no nipple retraction or dimpling, no nipple discharge or bleeding, no axillary or supraclavicular adenopathy, normal to palpation without dominant masses. Heart: regular rate and rhythm Abdomen: soft, non-tender; no masses,  no organomegaly. Extremities:  extremities normal, atraumatic, no cyanosis or edema. Skin: skin color, texture, turgor normal, no rashes or lesions. Lymph: cervical, supraclavicular, and axillary nodes normal; no abnormal inguinal nodes palpated. Neurologic: grossly normal.  Pelvic:  External genitalia: no lesions. Urethra: normal appearing urethra with no masses, tenderness or lesions. Bartholin's and Skene's: normal. Vagina: normal appearing vagina with normal color and discharge, no lesions. Cervix: normal appearance. Pap and high risk HPV testing done: Yes.   Uterus: uterus is normal size, shape, consistency and nontender. Adnexa: normal adnexa in size, nontender and no masses.                                      Assessment/Plan:   Tina Aguilar was seen today for annual exam.  Diagnoses and all orders for this visit:  Routine physical examination  Pure hypercholesterolemia Comments: Palpitations with Zocor. Wants to retry Vytorin - tolerated before.  Orders: -     Comprehensive metabolic panel -     Lipid panel -     ezetimibe-simvastatin (VYTORIN) 10-10 MG tablet; Take 1 tablet by mouth at bedtime.  Screening for HIV (human immunodeficiency virus) -     HIV antibody  Encounter for hepatitis C virus screening test for high risk patient -     Hepatitis C antibody  Hyperglycemia -     CBC with Differential/Platelet -     Hemoglobin A1c  Vitamin D deficiency -     VITAMIN D 25 Hydroxy (Vit-D Deficiency, Fractures)  Pap smear for cervical cancer screening -     Cytology - PAP  Pain of both hip joints Comments: Doing well with Mobic prn. Orders: -     meloxicam (MOBIC) 15 MG tablet; Take 1 tablet (15 mg total) by mouth daily.  Atrophic vaginitis Comments: Not sexually active. No itching.     Patient Counseling: [x]    Nutrition: Stressed importance of moderation in sodium/caffeine intake, saturated fat and cholesterol, caloric balance, sufficient intake of fresh fruits, vegetables, fiber, calcium,  iron, and 1 mg of folate supplement per day (for females capable of pregnancy).  [x]    Stressed the importance of regular exercise.   [x]    Substance Abuse: Discussed cessation/primary prevention of tobacco, alcohol, or other drug use; driving or other dangerous activities under the influence; availability of treatment for abuse.   [x]    Injury prevention: Discussed safety belts, safety helmets, smoke detector, smoking near bedding or upholstery.   [x]    Sexuality: Discussed sexually transmitted diseases, partner selection, use of condoms, avoidance of unintended pregnancy  and contraceptive alternatives.  [x]    Dental health: Discussed importance of regular tooth brushing, flossing, and dental visits.  [x]    Health maintenance and immunizations reviewed. Please refer  to Health maintenance section.   Briscoe Deutscher, DO [place]

## 2018-04-17 ENCOUNTER — Encounter: Payer: Self-pay | Admitting: Family Medicine

## 2018-04-17 ENCOUNTER — Ambulatory Visit (INDEPENDENT_AMBULATORY_CARE_PROVIDER_SITE_OTHER): Payer: BC Managed Care – PPO | Admitting: Family Medicine

## 2018-04-17 ENCOUNTER — Other Ambulatory Visit (HOSPITAL_COMMUNITY)
Admission: RE | Admit: 2018-04-17 | Discharge: 2018-04-17 | Disposition: A | Discharge status: Home / Self Care | Payer: BC Managed Care – PPO | Source: Ambulatory Visit | Attending: Family Medicine | Admitting: Family Medicine

## 2018-04-17 VITALS — BP 134/78 | HR 64 | Temp 98.5°F | Ht 63.0 in | Wt 160.4 lb

## 2018-04-17 DIAGNOSIS — Z9189 Other specified personal risk factors, not elsewhere classified: Secondary | ICD-10-CM

## 2018-04-17 DIAGNOSIS — Z1159 Encounter for screening for other viral diseases: Secondary | ICD-10-CM

## 2018-04-17 DIAGNOSIS — N952 Postmenopausal atrophic vaginitis: Secondary | ICD-10-CM | POA: Diagnosis not present

## 2018-04-17 DIAGNOSIS — Z124 Encounter for screening for malignant neoplasm of cervix: Secondary | ICD-10-CM

## 2018-04-17 DIAGNOSIS — Z114 Encounter for screening for human immunodeficiency virus [HIV]: Secondary | ICD-10-CM | POA: Diagnosis not present

## 2018-04-17 DIAGNOSIS — R739 Hyperglycemia, unspecified: Secondary | ICD-10-CM

## 2018-04-17 DIAGNOSIS — M25551 Pain in right hip: Secondary | ICD-10-CM | POA: Diagnosis not present

## 2018-04-17 DIAGNOSIS — E559 Vitamin D deficiency, unspecified: Secondary | ICD-10-CM

## 2018-04-17 DIAGNOSIS — M25552 Pain in left hip: Secondary | ICD-10-CM

## 2018-04-17 DIAGNOSIS — Z0000 Encounter for general adult medical examination without abnormal findings: Secondary | ICD-10-CM | POA: Diagnosis not present

## 2018-04-17 DIAGNOSIS — E7800 Pure hypercholesterolemia, unspecified: Secondary | ICD-10-CM | POA: Diagnosis not present

## 2018-04-17 LAB — CBC WITH DIFFERENTIAL/PLATELET
Basophils Absolute: 0 10*3/uL (ref 0.0–0.1)
Basophils Relative: 0.8 % (ref 0.0–3.0)
Eosinophils Absolute: 0.1 10*3/uL (ref 0.0–0.7)
Eosinophils Relative: 1.1 % (ref 0.0–5.0)
HCT: 39.4 % (ref 36.0–46.0)
Hemoglobin: 13.4 g/dL (ref 12.0–15.0)
Lymphocytes Relative: 35.1 % (ref 12.0–46.0)
Lymphs Abs: 2 10*3/uL (ref 0.7–4.0)
MCHC: 33.9 g/dL (ref 30.0–36.0)
MCV: 89.3 fl (ref 78.0–100.0)
Monocytes Absolute: 0.4 10*3/uL (ref 0.1–1.0)
Monocytes Relative: 7.6 % (ref 3.0–12.0)
Neutro Abs: 3.1 10*3/uL (ref 1.4–7.7)
Neutrophils Relative %: 55.4 % (ref 43.0–77.0)
Platelets: 231 10*3/uL (ref 150.0–400.0)
RBC: 4.42 Mil/uL (ref 3.87–5.11)
RDW: 13.2 % (ref 11.5–15.5)
WBC: 5.6 10*3/uL (ref 4.0–10.5)

## 2018-04-17 LAB — COMPREHENSIVE METABOLIC PANEL
ALT: 38 U/L — ABNORMAL HIGH (ref 0–35)
AST: 35 U/L (ref 0–37)
Albumin: 4.6 g/dL (ref 3.5–5.2)
Alkaline Phosphatase: 54 U/L (ref 39–117)
BUN: 16 mg/dL (ref 6–23)
CO2: 26 mEq/L (ref 19–32)
Calcium: 10 mg/dL (ref 8.4–10.5)
Chloride: 104 mEq/L (ref 96–112)
Creatinine, Ser: 0.77 mg/dL (ref 0.40–1.20)
GFR: 80.48 mL/min (ref >60.00)
Glucose, Bld: 84 mg/dL (ref 70–99)
Potassium: 4 mEq/L (ref 3.5–5.1)
Sodium: 141 mEq/L (ref 135–145)
Total Bilirubin: 0.5 mg/dL (ref 0.2–1.2)
Total Protein: 7.3 g/dL (ref 6.0–8.3)

## 2018-04-17 LAB — LIPID PANEL
Cholesterol: 152 mg/dL (ref 0–200)
HDL: 56 mg/dL (ref >39.00)
LDL Cholesterol: 84 mg/dL (ref 0–99)
NonHDL: 95.55
Total CHOL/HDL Ratio: 3
Triglycerides: 60 mg/dL (ref 0.0–149.0)
VLDL: 12 mg/dL (ref 0.0–40.0)

## 2018-04-17 LAB — HEMOGLOBIN A1C: Hgb A1c MFr Bld: 5.7 % (ref 4.6–6.5)

## 2018-04-17 LAB — VITAMIN D 25 HYDROXY (VIT D DEFICIENCY, FRACTURES): VITD: 43.47 ng/mL (ref 30.00–100.00)

## 2018-04-17 MED ORDER — EZETIMIBE-SIMVASTATIN 10-10 MG PO TABS
1.0000 | ORAL_TABLET | Freq: Every day | ORAL | 3 refills | Status: DC
Start: 2018-04-17 — End: 2019-07-09

## 2018-04-17 MED ORDER — MELOXICAM 15 MG PO TABS: 15.0000 mg | ORAL_TABLET | Freq: Every day | ORAL | 1 refills | Status: DC

## 2018-04-18 LAB — HIV ANTIBODY (ROUTINE TESTING W REFLEX): HIV 1&2 Ab, 4th Generation: NONREACTIVE

## 2018-04-18 LAB — HEPATITIS C ANTIBODY
Hepatitis C Ab: NONREACTIVE
SIGNAL TO CUT-OFF: 0.01 (ref <1.00)

## 2018-04-21 LAB — CYTOLOGY - PAP
Bacterial vaginitis: NEGATIVE
Candida vaginitis: NEGATIVE
Chlamydia: NEGATIVE
Diagnosis: NEGATIVE
HPV: NOT DETECTED
Neisseria Gonorrhea: NEGATIVE
Trichomonas: NEGATIVE

## 2018-05-15 DIAGNOSIS — M5417 Radiculopathy, lumbosacral region: Secondary | ICD-10-CM | POA: Diagnosis not present

## 2018-05-15 DIAGNOSIS — M9903 Segmental and somatic dysfunction of lumbar region: Secondary | ICD-10-CM | POA: Diagnosis not present

## 2018-05-15 DIAGNOSIS — M9905 Segmental and somatic dysfunction of pelvic region: Secondary | ICD-10-CM | POA: Diagnosis not present

## 2018-05-15 DIAGNOSIS — M9902 Segmental and somatic dysfunction of thoracic region: Secondary | ICD-10-CM | POA: Diagnosis not present

## 2018-05-21 DIAGNOSIS — M5417 Radiculopathy, lumbosacral region: Secondary | ICD-10-CM | POA: Diagnosis not present

## 2018-05-21 DIAGNOSIS — M9902 Segmental and somatic dysfunction of thoracic region: Secondary | ICD-10-CM | POA: Diagnosis not present

## 2018-05-21 DIAGNOSIS — M9905 Segmental and somatic dysfunction of pelvic region: Secondary | ICD-10-CM | POA: Diagnosis not present

## 2018-05-21 DIAGNOSIS — M9903 Segmental and somatic dysfunction of lumbar region: Secondary | ICD-10-CM | POA: Diagnosis not present

## 2018-06-01 ENCOUNTER — Telehealth: Payer: Self-pay | Admitting: *Deleted

## 2018-06-01 ENCOUNTER — Ambulatory Visit
Admission: RE | Admit: 2018-06-01 | Discharge: 2018-06-01 | Disposition: A | Discharge status: Home / Self Care | Payer: BC Managed Care – PPO | Source: Ambulatory Visit | Attending: Family Medicine | Admitting: Family Medicine

## 2018-06-01 DIAGNOSIS — M25551 Pain in right hip: Secondary | ICD-10-CM

## 2018-06-01 DIAGNOSIS — M25552 Pain in left hip: Principal | ICD-10-CM

## 2018-06-01 DIAGNOSIS — Z1231 Encounter for screening mammogram for malignant neoplasm of breast: Secondary | ICD-10-CM

## 2018-06-01 NOTE — Telephone Encounter (Signed)
Okay referral - I told her to call if not better.

## 2018-06-01 NOTE — Telephone Encounter (Signed)
Copied from [place] [id number]. Topic: Referral - Request >> Jun 01, 2018  7:14 AM Hewitt Shorts wrote: Pt states that even with the meloxican she is still experiencing hip and would like a referral to an orthopedic office   [facility] number [phone]

## 2018-06-01 NOTE — Telephone Encounter (Signed)
Please advise on referral

## 2018-06-02 NOTE — Addendum Note (Signed)
Addended by: Durwin Glaze on: 06/02/2018 09:15 AM   Modules accepted: Orders

## 2018-06-02 NOTE — Telephone Encounter (Signed)
Notified patient that referral has been placed and that office will call her to schedule.

## 2018-06-16 ENCOUNTER — Other Ambulatory Visit: Payer: Self-pay | Admitting: Family Medicine

## 2018-06-16 DIAGNOSIS — E785 Hyperlipidemia, unspecified: Secondary | ICD-10-CM

## 2018-06-16 NOTE — Telephone Encounter (Signed)
Please advise on medication refill. I do not see on med list.

## 2018-06-22 ENCOUNTER — Ambulatory Visit (INDEPENDENT_AMBULATORY_CARE_PROVIDER_SITE_OTHER): Payer: Self-pay

## 2018-06-22 ENCOUNTER — Encounter (INDEPENDENT_AMBULATORY_CARE_PROVIDER_SITE_OTHER): Payer: Self-pay | Admitting: Orthopaedic Surgery

## 2018-06-22 ENCOUNTER — Other Ambulatory Visit (INDEPENDENT_AMBULATORY_CARE_PROVIDER_SITE_OTHER): Payer: Self-pay | Admitting: *Deleted

## 2018-06-22 ENCOUNTER — Ambulatory Visit (INDEPENDENT_AMBULATORY_CARE_PROVIDER_SITE_OTHER): Payer: BC Managed Care – PPO | Admitting: Orthopaedic Surgery

## 2018-06-22 VITALS — BP 127/88 | HR 81 | Ht 63.5 in | Wt 159.0 lb

## 2018-06-22 DIAGNOSIS — M25551 Pain in right hip: Secondary | ICD-10-CM | POA: Diagnosis not present

## 2018-06-22 DIAGNOSIS — G8929 Other chronic pain: Secondary | ICD-10-CM | POA: Diagnosis not present

## 2018-06-22 DIAGNOSIS — M25552 Pain in left hip: Secondary | ICD-10-CM

## 2018-06-22 DIAGNOSIS — M5136 Other intervertebral disc degeneration, lumbar region: Secondary | ICD-10-CM

## 2018-06-22 DIAGNOSIS — M5441 Lumbago with sciatica, right side: Secondary | ICD-10-CM | POA: Diagnosis not present

## 2018-06-22 NOTE — Progress Notes (Addendum)
Office Visit Note   Patient: Tina Aguilar           Date of Birth: [birth year]           MRN: [id number] Visit Date: 06/22/2018              Requested by: Briscoe Deutscher, [address] [place], [place] [address] PCP: Briscoe Deutscher, DO   Assessment & Plan: Visit Diagnoses:  1. Chronic midline low back pain with right-sided sciatica   2. Pain in left hip   3. Pain in right hip     Plan: I think [name] has a combination of factors creating her lower extremity pain.  She certainly has some evidence of arthritis of her lumbar spine and possibly even stenosis causing claudication.  In addition she does have some arthritis in both of her hips more so on the right than the left that creates episodic groin pain.  Long discussion over more than 45 minutes regarding her potential problems and treatment options.  50% of the time in counseling.  I will order an MRI scan of the lumbar spine and have her return shortly thereafter.  She may be a candidate for epidural steroid injections, physical therapy and/or intra-articular cortisone injection of both hips  Follow-Up Instructions: No follow-ups on file.   Orders:  Orders Placed This Encounter  Procedures  . XR Lumbar Spine 2-3 Views  . XR HIP UNILAT W OR W/O PELVIS 2-3 VIEWS LEFT  . XR HIP UNILAT W OR W/O PELVIS 2-3 VIEWS RIGHT   No orders of the defined types were placed in this encounter.     Procedures: No procedures performed   Clinical Data: No additional findings.   Subjective: Chief Complaint  Patient presents with  . New Patient (Initial Visit)    bi lat hip pain for yrs just got worse in march 2019, moved and started new job and was carrying heavy books in basket on hip. pt also has back pain. was unable to get up for 23 week  62 year old female visited the office of evaluation of issues related to her back buttock and both lower extremities.  She is had trouble for "years" relating that she is had some  stiffness and soreness of her back but also bilateral groin pain.  She has not had any numbness or tingling but oftentimes will have pain into both lower extremities right greater than left the longer she stands of the further she walks.  He does not have any trouble when she sits nor does she have any problems when she sleeps.  However she first gets up from a sitting position she will have some discomfort in both of her hips.  She has good days and bad days.  Taking one Advil seems to alleviate a lot of her problems.  No prior back or hip surgery.  She did have a CT scan of her abdomen last year that did demonstrate some mild arthritic changes of both of her hips and a right sided lumbar scoliosis with anterior listhesis of L4 on L5. She has been going to a chiropractor for years for adjustments seem to have helped until just recently but those treatments were not effective.  HPI  Review of Systems  Constitutional: Positive for fatigue. Negative for fever.  HENT: Negative for ear pain.   Eyes: Negative for pain.  Respiratory: Negative for cough and shortness of breath.   Cardiovascular: Negative for leg swelling.  Gastrointestinal: Negative  for constipation and diarrhea.  Genitourinary: Negative for difficulty urinating.  Musculoskeletal: Positive for back pain. Negative for neck pain.  Skin: Negative for rash.  Allergic/Immunologic: Negative for food allergies.  Neurological: Positive for weakness. Negative for numbness.  Hematological: Does not bruise/bleed easily.  Psychiatric/Behavioral: Negative for sleep disturbance.     Objective: Vital Signs: BP 127/88 (BP Location: Left Arm, Patient Position: Sitting, Cuff Size: Normal)   Pulse 81   Ht 5' 3.5" (1.613 m)   Wt 159 lb (72.1 kg)   BMI 27.72 kg/m   Physical Exam  Constitutional: She is oriented to person, place, and time. She appears well-developed and well-nourished.  HENT:  Mouth/Throat: Oropharynx is clear and moist.  Eyes:  Pupils are equal, round, and reactive to light. EOM are normal.  Pulmonary/Chest: Effort normal.  Neurological: She is alert and oriented to person, place, and time.  Skin: Skin is warm and dry.  Psychiatric: She has a normal mood and affect. Her behavior is normal.    Ortho Exam awake alert and oriented x3 very pleasant.  Walks without a limp.  I thought straight leg raise was negative bilaterally.  Able to forward bend and touch the tips of her fingers to the tips of her toes.  Mild lumbosacral spine percussible tenderness.  Reflexes symmetrical.  No atrophy or hypertrophy of one leg compared to the other.  I thought she had decreased range of motion of both hips without much pain more so on the right than the left  Specialty Comments:  No specialty comments available.  Imaging: No results found.   PMFS History: Patient Active Problem List   Diagnosis Date Noted  . Binge eating disorder 05/27/2017  . Headache 05/27/2017  . Colon polyps 08/30/2010  . Hyperlipidemia 06/30/2007   Past Medical History:  Diagnosis Date  . Arthritis    hips, hands  . Frequency of urination   . History of adenomatous polyp of colon 08/28/2005  . Hyperlipidemia, mixed   . Left ureteral stone   . Wears glasses     Family History  Problem Relation Age of Onset  . Coronary artery disease Mother   . Arthritis Mother   . Congestive Heart Failure Father   . Heart attack Father   . Other Brother        coronary artery bypass graft age 24  . Hyperlipidemia Brother   . Diabetes Brother   . Hyperlipidemia Sister   . Diabetes Sister   . Heart disease Sister   . Hyperlipidemia Sister   . Hyperlipidemia Sister   . Heart attack Maternal Aunt        at age 20  . Breast cancer Neg Hx     Past Surgical History:  Procedure Laterality Date  . CESAREAN SECTION  1991;  1993  . COLONOSCOPY  last one 11-23-2010  . CYSTOSCOPY/URETEROSCOPY/HOLMIUM LASER/STENT PLACEMENT Left 05/06/2017   Procedure:  CYSTOSCOPY/URETEROSCOPY/HOLMIUM LASER/STENT PLACEMENT;  Surgeon: Nickie Retort, MD;  Location: [facility];  Service: Urology;  Laterality: Left;  . DILATION AND CURETTAGE OF UTERUS  1980's   w/ Suction for miscarriage  x2   Social History   Occupational History  . Not on file  Tobacco Use  . Smoking status: Former Smoker    Packs/day: 1.00    Years: 5.00    Pack years: 5.00    Types: Cigarettes    Last attempt to quit: 04/30/1977    Years since quitting: 41.1  . Smokeless tobacco: Never  Used  Substance and Sexual Activity  . Alcohol use: No    Alcohol/week: 0.0 standard drinks  . Drug use: No  . Sexual activity: Not on file

## 2018-06-23 ENCOUNTER — Telehealth: Payer: Self-pay

## 2018-06-23 NOTE — Telephone Encounter (Signed)
On Vytorin 

## 2018-06-23 NOTE — Telephone Encounter (Signed)
Fax received from [facility] requesting refill on following medications. We have not given or have documentation of patient being on in chart.   Fenofibrate 130 mg 1 po daily #90 with 3 RF  Simvastatin 20mg  1 po daily   Ok to fill?

## 2018-06-24 NOTE — Telephone Encounter (Signed)
Called l/m to call office  

## 2018-06-24 NOTE — Telephone Encounter (Signed)
Called patient to she is not on them she is on the vytorin now not other two. Does not need refilled.

## 2018-07-05 ENCOUNTER — Ambulatory Visit
Admission: RE | Admit: 2018-07-05 | Discharge: 2018-07-05 | Disposition: A | Discharge status: Home / Self Care | Payer: BC Managed Care – PPO | Source: Ambulatory Visit | Attending: Orthopaedic Surgery | Admitting: Orthopaedic Surgery

## 2018-07-05 DIAGNOSIS — M5136 Other intervertebral disc degeneration, lumbar region: Secondary | ICD-10-CM

## 2018-07-05 DIAGNOSIS — M48061 Spinal stenosis, lumbar region without neurogenic claudication: Secondary | ICD-10-CM | POA: Diagnosis not present

## 2018-07-10 ENCOUNTER — Other Ambulatory Visit (INDEPENDENT_AMBULATORY_CARE_PROVIDER_SITE_OTHER): Payer: Self-pay | Admitting: Radiology

## 2018-07-10 ENCOUNTER — Encounter (INDEPENDENT_AMBULATORY_CARE_PROVIDER_SITE_OTHER): Payer: Self-pay | Admitting: Orthopaedic Surgery

## 2018-07-10 ENCOUNTER — Ambulatory Visit (INDEPENDENT_AMBULATORY_CARE_PROVIDER_SITE_OTHER): Payer: BC Managed Care – PPO | Admitting: Orthopaedic Surgery

## 2018-07-10 ENCOUNTER — Telehealth (INDEPENDENT_AMBULATORY_CARE_PROVIDER_SITE_OTHER): Payer: Self-pay | Admitting: Orthopaedic Surgery

## 2018-07-10 VITALS — BP 131/82 | HR 76 | Ht 63.0 in | Wt 159.0 lb

## 2018-07-10 DIAGNOSIS — G8929 Other chronic pain: Secondary | ICD-10-CM

## 2018-07-10 DIAGNOSIS — M5442 Lumbago with sciatica, left side: Secondary | ICD-10-CM | POA: Diagnosis not present

## 2018-07-10 DIAGNOSIS — M545 Low back pain: Principal | ICD-10-CM

## 2018-07-10 DIAGNOSIS — M5441 Lumbago with sciatica, right side: Secondary | ICD-10-CM

## 2018-07-10 NOTE — Progress Notes (Signed)
Office Visit Note   Patient: Tina Aguilar           Date of Birth: 19-Jun-1956           MRN: [id number] Visit Date: 07/10/2018              Requested by: Briscoe Deutscher, [address] [place], [place] [address] PCP: Briscoe Deutscher, DO   Assessment & Plan: Visit Diagnoses:  1. Chronic bilateral low back pain with bilateral sciatica     Plan: MRI scan of lumbar spine demonstrates moderate multifactorial spinal stenosis at L3-4 and mild stenosis at L2-3 there are various levels of arthritic change her spine as well.  Discussion over 30 minutes regarding the diagnosis view of the MRI scan.  Have discussed use of over-the-counter medicines, physical therapy and even cortisone injection.  Continue with over-the-counter medicines and try a course of physical therapy.  Return in 6 to 8 weeks if no improvement consider epidural steroid  Follow-Up Instructions: Return if symptoms worsen or fail to improve.   Orders:  No orders of the defined types were placed in this encounter.  No orders of the defined types were placed in this encounter.     Procedures: No procedures performed   Clinical Data: No additional findings.   Subjective: Chief Complaint  Patient presents with  . Follow-up    MRI L SPINE REVIEW  Tina Aguilar is been experiencing low back pain associated with lower extremity discomfort more so on the right than the left.  Accordingly, I have ordered an MRI scan.  The results are as above.  There are varying levels of arthritis as well as spinal stenosis which I think accounts for both her back and leg pain.  HPI  Review of Systems  Constitutional: Positive for fatigue.  HENT: Negative for ear pain.   Eyes: Negative for pain.  Cardiovascular: Positive for leg swelling.  Gastrointestinal: Negative for constipation and diarrhea.  Genitourinary: Negative for difficulty urinating.  Musculoskeletal: Positive for back pain. Negative for neck pain.  Skin:  Negative for rash.  Allergic/Immunologic: Negative for food allergies.  Neurological: Positive for weakness. Negative for numbness.  Hematological: Does not bruise/bleed easily.  Psychiatric/Behavioral: Positive for sleep disturbance.     Objective: Vital Signs: BP 131/82 (BP Location: Right Arm, Patient Position: Sitting, Cuff Size: Normal)   Pulse 76   Ht 5\' 3"  (1.6 m)   Wt 159 lb (72.1 kg)   BMI 28.17 kg/m   Physical Exam  Constitutional: She is oriented to person, place, and time. She appears well-developed and well-nourished.  HENT:  Mouth/Throat: Oropharynx is clear and moist.  Eyes: Pupils are equal, round, and reactive to light. EOM are normal.  Pulmonary/Chest: Effort normal.  Neurological: She is alert and oriented to person, place, and time.  Skin: Skin is warm and dry.  Psychiatric: She has a normal mood and affect. Her behavior is normal.    Ortho Exam awake alert and oriented x3.  Comfortable sitting.  Straight leg raise negative bilaterally.  Reflexes appear to be symmetrical.  Good capillary refill to toes.  Motor exam appears to be intact.  Painless range of motion both hips.  Minimal percussible tenderness to lower lumbar spine.  Specialty Comments:  No specialty comments available.  Imaging: No results found.   PMFS History: Patient Active Problem List   Diagnosis Date Noted  . Binge eating disorder 05/27/2017  . Headache 05/27/2017  . Colon polyps 08/30/2010  . Hyperlipidemia  06/30/2007   Past Medical History:  Diagnosis Date  . Arthritis    hips, hands  . Frequency of urination   . History of adenomatous polyp of colon 08/28/2005  . Hyperlipidemia, mixed   . Left ureteral stone   . Wears glasses     Family History  Problem Relation Age of Onset  . Coronary artery disease Mother   . Arthritis Mother   . Congestive Heart Failure Father   . Heart attack Father   . Other Brother        coronary artery bypass graft age 68  . Hyperlipidemia  Brother   . Diabetes Brother   . Hyperlipidemia Sister   . Diabetes Sister   . Heart disease Sister   . Hyperlipidemia Sister   . Hyperlipidemia Sister   . Heart attack Maternal Aunt        at age 37  . Breast cancer Neg Hx     Past Surgical History:  Procedure Laterality Date  . CESAREAN SECTION  1991;  1993  . COLONOSCOPY  last one 11-23-2010  . CYSTOSCOPY/URETEROSCOPY/HOLMIUM LASER/STENT PLACEMENT Left 05/06/2017   Procedure: CYSTOSCOPY/URETEROSCOPY/HOLMIUM LASER/STENT PLACEMENT;  Surgeon: Nickie Retort, MD;  Location: [facility];  Service: Urology;  Laterality: Left;  . DILATION AND CURETTAGE OF UTERUS  1980's   w/ Suction for miscarriage  x2   Social History   Occupational History  . Not on file  Tobacco Use  . Smoking status: Former Smoker    Packs/day: 1.00    Years: 5.00    Pack years: 5.00    Types: Cigarettes    Last attempt to quit: 04/30/1977    Years since quitting: 41.2  . Smokeless tobacco: Never Used  Substance and Sexual Activity  . Alcohol use: No    Alcohol/week: 0.0 standard drinks  . Drug use: No  . Sexual activity: Not on file

## 2018-07-10 NOTE — Telephone Encounter (Signed)
Patient called stating Dr. Durward Fortes recommended Tina Aguilar and patient is asking if she can take Doans pills for the back.  Patient requested a return call.

## 2018-07-10 NOTE — Telephone Encounter (Signed)
Please call patient and advise. Thank you.

## 2018-07-10 NOTE — Telephone Encounter (Signed)
ok 

## 2018-07-10 NOTE — Telephone Encounter (Signed)
Please advise 

## 2018-08-10 ENCOUNTER — Ambulatory Visit: Payer: BC Managed Care – PPO | Attending: Orthopaedic Surgery | Admitting: Physical Therapy

## 2018-08-10 ENCOUNTER — Other Ambulatory Visit: Payer: Self-pay

## 2018-08-10 ENCOUNTER — Encounter: Payer: Self-pay | Admitting: Physical Therapy

## 2018-08-10 DIAGNOSIS — G8929 Other chronic pain: Secondary | ICD-10-CM | POA: Diagnosis not present

## 2018-08-10 DIAGNOSIS — M25652 Stiffness of left hip, not elsewhere classified: Secondary | ICD-10-CM | POA: Insufficient documentation

## 2018-08-10 DIAGNOSIS — M545 Low back pain: Secondary | ICD-10-CM | POA: Diagnosis not present

## 2018-08-10 DIAGNOSIS — M25651 Stiffness of right hip, not elsewhere classified: Secondary | ICD-10-CM

## 2018-08-10 NOTE — Therapy (Signed)
[facility] [address], [place], [place], [address] Phone: [phone]   Fax:  [phone]  Physical Therapy Evaluation  Patient Details  Name: Tina Aguilar MRN: [id number] Date of Birth: 10-08-1956 Referring Provider (PT): Joni Fears   Encounter Date: 08/10/2018  PT End of Session - 08/10/18 1014    Visit Number  1    Date for PT Re-Evaluation  11/02/18    PT Start Time  7121    PT Stop Time  1101    PT Time Calculation (min)  46 min    Activity Tolerance  Patient tolerated treatment well    Behavior During Therapy  [facility] for tasks assessed/performed       Past Medical History:  Diagnosis Date  . Arthritis    hips, hands  . Frequency of urination   . History of adenomatous polyp of colon 08/28/2005  . Hyperlipidemia, mixed   . Left ureteral stone   . Wears glasses     Past Surgical History:  Procedure Laterality Date  . CESAREAN SECTION  1991;  1993  . COLONOSCOPY  last one 11-23-2010  . CYSTOSCOPY/URETEROSCOPY/HOLMIUM LASER/STENT PLACEMENT Left 05/06/2017   Procedure: CYSTOSCOPY/URETEROSCOPY/HOLMIUM LASER/STENT PLACEMENT;  Surgeon: Nickie Retort, MD;  Location: [facility];  Service: Urology;  Laterality: Left;  . DILATION AND CURETTAGE OF UTERUS  1980's   w/ Suction for miscarriage  x2    There were no vitals filed for this visit.   Subjective Assessment - 08/10/18 1028    Subjective  Patient presents for one time visit to establish HEP. She has a long history of LBP/hip pain which worsened in the past 7 months. Patient has most pain after prolonged sititng and standing and also with chores. she is sitff in the morning also.    Pertinent History  2 csections    Diagnostic tests  stenosis lumbar and thoracic    Currently in Pain?  Yes    Pain Score  8    up to 8/10 at worse; 0/10 today   Pain Orientation  Right;Left    Pain Descriptors / Indicators  Shooting     Pain Type  Chronic pain    Pain Radiating Towards  bil anterior hips     Pain Onset  More than a month ago    Pain Frequency  Intermittent    Aggravating Factors   prolonged sitting and standing    Pain Relieving Factors  sit/lie down    Effect of Pain on Daily Activities  limited         [facility] PT Assessment - 08/10/18 0001      Assessment   Medical Diagnosis  chronic midline LBP with sciatica    Referring Provider (PT)  Joni Fears    Onset Date/Surgical Date  12/12/17    Hand Dominance  Right      Precautions   Precautions  None      Balance Screen   Has the patient fallen in the past 6 months  No    Has the patient had a decrease in activity level because of a fear of falling?   No    Is the patient reluctant to leave their home because of a fear of falling?   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  Observation/Other Assessments   Focus on Therapeutic Outcomes (FOTO)   47% limited      Posture/Postural Control   Posture/Postural Control  Postural limitations    Postural Limitations  --   Right iliac crest higher than left   Posture Comments  R leg length 33 in; L 32.5 cm      ROM / Strength   AROM / PROM / Strength  AROM;Strength      AROM   Overall AROM Comments  lumbar WFL      Strength   Overall Strength Comments  grossly 5/5 in BLE      Flexibility   Soft Tissue Assessment /Muscle Length  yes    Hamstrings  bil tighness    Quadriceps  bil tightness and HF    ITB  bil tightness    Piriformis  marked tightness bil      Palpation   Palpation comment  unremarkable                Objective measurements completed on examination: See above findings.              PT Education - 08/10/18 1332    Education Details  HEP    Person(s) Educated  Patient    Methods  Explanation;Demonstration;Handout     Comprehension  Verbalized understanding;Returned demonstration          PT Long Term Goals - 08/10/18 1336      PT LONG TERM GOAL #1   Title  Ind with HEP    Time  1    Period  Days    Status  Achieved             Plan - 08/10/18 1333    Clinical Impression Statement  Patient presents for one time visit to establish HEP. She has a long history of LBP/hip pain which worsened in the past 7 months. Pain is mainly in bil ant hips. Patient has most pain after prolonged sititng and standing and also with chores. She has lumbar ROM WFL and good LE strength. She has a 1/2 inch leg length discrepancy and was advised to try a OTC heel lift in her left shoe. She has marked flexibility deficits in her hips and BLE. She responded well to HEP reporting some relief at end of evaluation.     Clinical Presentation  Stable    Clinical Decision Making  Low    Rehab Potential  Excellent    PT Frequency  One time visit    PT Treatment/Interventions  Therapeutic exercise    PT Next Visit Plan  one time visit    PT Home Exercise Plan  stretches: HS, quads, HF, ITB and seated figure 4    Consulted and Agree with Plan of Care  Patient       Patient will benefit from skilled therapeutic intervention in order to improve the following deficits and impairments:  Pain, Impaired flexibility, Postural dysfunction  Visit Diagnosis: Stiffness of left hip, not elsewhere classified - Plan: PT plan of care cert/re-cert  Stiffness of right hip, not elsewhere classified - Plan: PT plan of care cert/re-cert  Chronic midline low back pain without sciatica - Plan: PT plan of care cert/re-cert     Problem List Patient Active Problem List   Diagnosis Date Noted  . Binge eating disorder 05/27/2017  . Headache 05/27/2017  . Colon polyps 08/30/2010  . Hyperlipidemia 06/30/2007    Gianni Mihalik PT 08/10/2018, 1:43 PM  [facility] [address],  [place], [place], [address] Phone: [phone]   Fax:  [phone]  Name: Tina Aguilar MRN: [id number] Date of Birth: Jan 11, 1956   PHYSICAL THERAPY DISCHARGE SUMMARY  Visits from Start of Care:1  Current functional level related to goals / functional outcomes: SEE ABOVE   Remaining deficits: SEE ABOVE   Education / Equipment: HEP  Plan: Patient agrees to discharge.  Patient goals were met. Patient is being discharged due to not returning since the last visit.  ?????    Madelyn Flavors, PT 08/10/18 1:44 PM; [facility] [address], [place], [place] [address] Phone # [phone] Fax [phone]

## 2018-08-10 NOTE — Patient Instructions (Signed)
  Hamstring Stretch, Reclined (Strap, Doorframe)   Lengthen bottom leg on floor. Extend top leg along edge of doorframe or press foot up into yoga strap. Hold for 30 seconds. Repeat 3_ times each leg.   Outer Hip Stretch: Reclined IT Band Stretch (Strap)   Strap around opposite foot, pull across only as far as possible with shoulders on mat. Hold for __30__ seconds. Repeat __3__ times each leg. 2-3 x/day. Quadriceps (Prone)   On stomach with sheet around ankles, knees together, hips down, pull heels toward bottom. Keep hips flat.  YOU MAY DO THIS ONE LEG AT A TIME. Hold __30__ seconds. Repeat _3__ times. Do __3__ sessions per day. CAUTION: Stretch should be gentle, steady and slow.   Hip Flexor Stretch   Kneel as shown. Interlace fingers on top of right knee. Keeping trunk straight and contracting abdominal muscles, slowly shift weight forward. Continue breathing normally and hold position for 30 -60 seconds. Repeat on other leg. Alternate sides _3__ times. Do _2-3__ times per day.   Hip Stretch  Put right ankle over left knee. Let right knee fall downward, but keep ankle in place. Feel the stretch in hip. May push down gently with hand to feel stretch. Hold __60__ seconds while counting out loud. Repeat with other leg. Repeat ___3_ times. Do __2_ sessions per day.   Madelyn Flavors, PT 08/10/18 11:00 AM [facility] [address] [place], [place] [address] Phone # [phone] Fax [phone]

## 2018-09-17 DIAGNOSIS — K080 Exfoliation of teeth due to systemic causes: Secondary | ICD-10-CM | POA: Diagnosis not present

## 2019-04-26 ENCOUNTER — Other Ambulatory Visit: Payer: Self-pay | Admitting: Family Medicine

## 2019-04-26 DIAGNOSIS — Z1231 Encounter for screening mammogram for malignant neoplasm of breast: Secondary | ICD-10-CM

## 2019-04-28 DIAGNOSIS — M9901 Segmental and somatic dysfunction of cervical region: Secondary | ICD-10-CM | POA: Diagnosis not present

## 2019-04-28 DIAGNOSIS — M9902 Segmental and somatic dysfunction of thoracic region: Secondary | ICD-10-CM | POA: Diagnosis not present

## 2019-04-28 DIAGNOSIS — M47812 Spondylosis without myelopathy or radiculopathy, cervical region: Secondary | ICD-10-CM | POA: Diagnosis not present

## 2019-04-28 DIAGNOSIS — M5032 Other cervical disc degeneration, mid-cervical region, unspecified level: Secondary | ICD-10-CM | POA: Diagnosis not present

## 2019-05-05 DIAGNOSIS — M47812 Spondylosis without myelopathy or radiculopathy, cervical region: Secondary | ICD-10-CM | POA: Diagnosis not present

## 2019-05-05 DIAGNOSIS — M9901 Segmental and somatic dysfunction of cervical region: Secondary | ICD-10-CM | POA: Diagnosis not present

## 2019-05-05 DIAGNOSIS — M9902 Segmental and somatic dysfunction of thoracic region: Secondary | ICD-10-CM | POA: Diagnosis not present

## 2019-05-05 DIAGNOSIS — M5032 Other cervical disc degeneration, mid-cervical region, unspecified level: Secondary | ICD-10-CM | POA: Diagnosis not present

## 2019-05-12 DIAGNOSIS — M47812 Spondylosis without myelopathy or radiculopathy, cervical region: Secondary | ICD-10-CM | POA: Diagnosis not present

## 2019-05-12 DIAGNOSIS — M9902 Segmental and somatic dysfunction of thoracic region: Secondary | ICD-10-CM | POA: Diagnosis not present

## 2019-05-12 DIAGNOSIS — M5032 Other cervical disc degeneration, mid-cervical region, unspecified level: Secondary | ICD-10-CM | POA: Diagnosis not present

## 2019-05-12 DIAGNOSIS — M9901 Segmental and somatic dysfunction of cervical region: Secondary | ICD-10-CM | POA: Diagnosis not present

## 2019-05-31 DIAGNOSIS — M9902 Segmental and somatic dysfunction of thoracic region: Secondary | ICD-10-CM | POA: Diagnosis not present

## 2019-05-31 DIAGNOSIS — M5032 Other cervical disc degeneration, mid-cervical region, unspecified level: Secondary | ICD-10-CM | POA: Diagnosis not present

## 2019-05-31 DIAGNOSIS — M47812 Spondylosis without myelopathy or radiculopathy, cervical region: Secondary | ICD-10-CM | POA: Diagnosis not present

## 2019-05-31 DIAGNOSIS — M9901 Segmental and somatic dysfunction of cervical region: Secondary | ICD-10-CM | POA: Diagnosis not present

## 2019-06-01 ENCOUNTER — Telehealth: Payer: Self-pay | Admitting: Family Medicine

## 2019-06-01 NOTE — Telephone Encounter (Signed)
Patient requesting a referral to a spinal specialist to discuss spinal stenosis spacers, please advise

## 2019-06-01 NOTE — Telephone Encounter (Signed)
Left message to return call to our office for an appointment have not seen in over year. Will send my chart message as well.

## 2019-06-01 NOTE — Telephone Encounter (Signed)
See below

## 2019-06-02 ENCOUNTER — Encounter: Payer: BC Managed Care – PPO | Admitting: Family Medicine

## 2019-06-10 ENCOUNTER — Ambulatory Visit
Admission: RE | Admit: 2019-06-10 | Discharge: 2019-06-10 | Disposition: A | Discharge status: Home / Self Care | Payer: BC Managed Care – PPO | Source: Ambulatory Visit | Attending: Family Medicine | Admitting: Family Medicine

## 2019-06-10 ENCOUNTER — Other Ambulatory Visit: Payer: Self-pay

## 2019-06-10 DIAGNOSIS — Z1231 Encounter for screening mammogram for malignant neoplasm of breast: Secondary | ICD-10-CM

## 2019-06-15 ENCOUNTER — Encounter: Payer: BC Managed Care – PPO | Admitting: Family Medicine

## 2019-06-28 ENCOUNTER — Encounter: Payer: BC Managed Care – PPO | Admitting: Family Medicine

## 2019-07-07 ENCOUNTER — Encounter: Payer: Self-pay | Admitting: Family Medicine

## 2019-07-07 ENCOUNTER — Other Ambulatory Visit: Payer: Self-pay

## 2019-07-07 ENCOUNTER — Ambulatory Visit (INDEPENDENT_AMBULATORY_CARE_PROVIDER_SITE_OTHER): Payer: BC Managed Care – PPO | Admitting: Family Medicine

## 2019-07-07 VITALS — BP 150/82 | HR 71 | Temp 97.6°F | Ht 63.0 in | Wt 159.0 lb

## 2019-07-07 DIAGNOSIS — M48061 Spinal stenosis, lumbar region without neurogenic claudication: Secondary | ICD-10-CM

## 2019-07-07 DIAGNOSIS — M791 Myalgia, unspecified site: Secondary | ICD-10-CM

## 2019-07-07 DIAGNOSIS — E7800 Pure hypercholesterolemia, unspecified: Secondary | ICD-10-CM | POA: Diagnosis not present

## 2019-07-07 DIAGNOSIS — E559 Vitamin D deficiency, unspecified: Secondary | ICD-10-CM

## 2019-07-07 DIAGNOSIS — R5383 Other fatigue: Secondary | ICD-10-CM | POA: Diagnosis not present

## 2019-07-07 DIAGNOSIS — Z0000 Encounter for general adult medical examination without abnormal findings: Secondary | ICD-10-CM | POA: Diagnosis not present

## 2019-07-07 DIAGNOSIS — M2550 Pain in unspecified joint: Secondary | ICD-10-CM

## 2019-07-07 NOTE — Addendum Note (Signed)
Addended by: Francis Dowse T on: 07/07/2019 02:38 PM   Modules accepted: Orders

## 2019-07-07 NOTE — Addendum Note (Signed)
Addended by: Francis Dowse T on: 07/07/2019 02:21 PM   Modules accepted: Orders

## 2019-07-07 NOTE — Progress Notes (Signed)
Subjective:    Tina Aguilar is a 63 y.o. female and is here for a comprehensive physical exam.  There are no preventive care reminders to display for this patient.   Current Outpatient Medications:  .  Ascorbic Acid (VITAMIN C GUMMIE PO), Take by mouth daily., Disp: , Rfl:  .  Cholecalciferol (VITAMIN D3 GUMMIES ADULT PO), Take by mouth daily., Disp: , Rfl:  .  Coenzyme Q10 (COQ10 GUMMIES ADULT PO), Take by mouth daily., Disp: , Rfl:  .  ezetimibe-simvastatin (VYTORIN) 10-10 MG tablet, Take 1 tablet by mouth at bedtime., Disp: 90 tablet, Rfl: 3 .  Multiple Vitamins-Minerals (MULTIVITAMIN GUMMIES WOMENS PO), Take by mouth daily., Disp: , Rfl:  .  Turmeric 1053 MG TABS, Take by mouth., Disp: , Rfl:   PMHx, SurgHx, SocialHx, Medications, and Allergies were reviewed in the Visit Navigator and updated as appropriate.   Past Medical History:  Diagnosis Date  . Arthritis    hips, hands  . Frequency of urination   . History of adenomatous polyp of colon 08/28/2005  . Hyperlipidemia, mixed   . Left ureteral stone   . Spinal stenosis   . Wears glasses      Past Surgical History:  Procedure Laterality Date  . CESAREAN SECTION  1991;  1993  . COLONOSCOPY  last one 11-23-2010  . CYSTOSCOPY/URETEROSCOPY/HOLMIUM LASER/STENT PLACEMENT Left 05/06/2017   Procedure: CYSTOSCOPY/URETEROSCOPY/HOLMIUM LASER/STENT PLACEMENT;  Surgeon: Nickie Retort, MD;  Location: [facility];  Service: Urology;  Laterality: Left;  . DILATION AND CURETTAGE OF UTERUS  1980's   w/ Suction for miscarriage  x2     Family History  Problem Relation Age of Onset  . Coronary artery disease Mother   . Arthritis Mother   . Congestive Heart Failure Father   . Heart attack Father   . Other Brother        coronary artery bypass graft age 14  . Hyperlipidemia Brother   . Diabetes Brother   . Hyperlipidemia Sister   . Diabetes Sister   . Heart disease Sister   . Hyperlipidemia Sister    . Hyperlipidemia Sister   . Heart attack Maternal Aunt        at age 76  . Breast cancer Neg Hx     Social History   Tobacco Use  . Smoking status: Former Smoker    Packs/day: 1.00    Years: 5.00    Pack years: 5.00    Types: Cigarettes    Quit date: 04/30/1977    Years since quitting: 42.2  . Smokeless tobacco: Never Used  Substance Use Topics  . Alcohol use: No    Alcohol/week: 0.0 standard drinks  . Drug use: No    Review of Systems:   Pertinent items are noted in the HPI. Otherwise, ROS is negative.  Objective:   BP (!) 150/82   Pulse 71   Temp 97.6 F (36.4 C) (Temporal)   Ht 5\' 3"  (1.6 m)   Wt 159 lb (72.1 kg) Comment: pt refused wt today last weight was entered for BMI  SpO2 98%   BMI 28.17 kg/m   General appearance: alert, cooperative and appears stated age. Head: normocephalic, without obvious abnormality, atraumatic. Neck: no adenopathy, supple, symmetrical, trachea midline; thyroid not enlarged, symmetric, no tenderness/mass/nodules. Lungs: clear to auscultation bilaterally. Heart: regular rate and rhythm Abdomen: soft, non-tender; no masses,  no organomegaly. Extremities: extremities normal, atraumatic, no cyanosis or edema. Skin: skin color, texture, turgor normal,  no rashes or lesions. Lymph: cervical, supraclavicular, and axillary nodes normal; no abnormal inguinal nodes palpated. Neurologic: grossly normal.  Assessment/Plan:   Tina Aguilar was seen today for annual exam.  Diagnoses and all orders for this visit:  Routine physical examination  Pure hypercholesterolemia -     Lipid panel  Spinal stenosis of lumbar region, unspecified whether neurogenic claudication present -     Ambulatory referral to Neurosurgery  Fatigue, unspecified type -     CBC with Differential/Platelet -     Comprehensive metabolic panel -     SAR CoV2 Serology (COVID 19)AB(IGG)IA  Vitamin D deficiency -     VITAMIN D 25 Hydroxy (Vit-D Deficiency,  Fractures)  Myalgia -     ANA -     Sedimentation rate -     C-reactive protein -     Rheumatoid Factor -     Cyclic citrul peptide antibody, IgG -     CK  Arthralgia, unspecified joint -     ANA -     Sedimentation rate -     C-reactive protein -     Rheumatoid Factor -     Cyclic citrul peptide antibody, IgG   Patient Counseling: [x]    Nutrition: Stressed importance of moderation in sodium/caffeine intake, saturated fat and cholesterol, caloric balance, sufficient intake of fresh fruits, vegetables, fiber, calcium, iron, and 1 mg of folate supplement per day (for females capable of pregnancy).  [x]    Stressed the importance of regular exercise.   [x]    Substance Abuse: Discussed cessation/primary prevention of tobacco, alcohol, or other drug use; driving or other dangerous activities under the influence; availability of treatment for abuse.   [x]    Injury prevention: Discussed safety belts, safety helmets, smoke detector, smoking near bedding or upholstery.   [x]    Sexuality: Discussed sexually transmitted diseases, partner selection, use of condoms, avoidance of unintended pregnancy  and contraceptive alternatives.  [x]    Dental health: Discussed importance of regular tooth brushing, flossing, and dental visits.  [x]    Health maintenance and immunizations reviewed. Please refer to Health maintenance section.   Briscoe Deutscher, DO [place]

## 2019-07-08 ENCOUNTER — Other Ambulatory Visit (INDEPENDENT_AMBULATORY_CARE_PROVIDER_SITE_OTHER): Payer: BC Managed Care – PPO

## 2019-07-08 DIAGNOSIS — E559 Vitamin D deficiency, unspecified: Secondary | ICD-10-CM | POA: Diagnosis not present

## 2019-07-08 DIAGNOSIS — M2550 Pain in unspecified joint: Secondary | ICD-10-CM | POA: Diagnosis not present

## 2019-07-08 DIAGNOSIS — E7800 Pure hypercholesterolemia, unspecified: Secondary | ICD-10-CM | POA: Diagnosis not present

## 2019-07-08 DIAGNOSIS — M791 Myalgia, unspecified site: Secondary | ICD-10-CM

## 2019-07-08 DIAGNOSIS — R5383 Other fatigue: Secondary | ICD-10-CM

## 2019-07-08 LAB — CBC WITH DIFFERENTIAL/PLATELET
Basophils Absolute: 0 10*3/uL (ref 0.0–0.1)
Basophils Relative: 0.8 % (ref 0.0–3.0)
Eosinophils Absolute: 0.1 10*3/uL (ref 0.0–0.7)
Eosinophils Relative: 1.7 % (ref 0.0–5.0)
HCT: 40.5 % (ref 36.0–46.0)
Hemoglobin: 13.5 g/dL (ref 12.0–15.0)
Lymphocytes Relative: 32.2 % (ref 12.0–46.0)
Lymphs Abs: 1.7 10*3/uL (ref 0.7–4.0)
MCHC: 33.4 g/dL (ref 30.0–36.0)
MCV: 90.6 fl (ref 78.0–100.0)
Monocytes Absolute: 0.4 10*3/uL (ref 0.1–1.0)
Monocytes Relative: 7.9 % (ref 3.0–12.0)
Neutro Abs: 3 10*3/uL (ref 1.4–7.7)
Neutrophils Relative %: 57.4 % (ref 43.0–77.0)
Platelets: 252 10*3/uL (ref 150.0–400.0)
RBC: 4.47 Mil/uL (ref 3.87–5.11)
RDW: 13.4 % (ref 11.5–15.5)
WBC: 5.2 10*3/uL (ref 4.0–10.5)

## 2019-07-08 LAB — SEDIMENTATION RATE: Sed Rate: 22 mm/hr (ref 0–30)

## 2019-07-08 LAB — LIPID PANEL
Cholesterol: 287 mg/dL — ABNORMAL HIGH (ref 0–200)
HDL: 64.4 mg/dL (ref >39.00)
LDL Cholesterol: 213 mg/dL — ABNORMAL HIGH (ref 0–99)
NonHDL: 222.54
Total CHOL/HDL Ratio: 4
Triglycerides: 50 mg/dL (ref 0.0–149.0)
VLDL: 10 mg/dL (ref 0.0–40.0)

## 2019-07-08 LAB — C-REACTIVE PROTEIN: CRP: <1 mg/dL (ref 0.5–20.0)

## 2019-07-08 LAB — CK: Total CK: 42 U/L (ref 7–177)

## 2019-07-08 LAB — COMPREHENSIVE METABOLIC PANEL
ALT: 65 U/L — ABNORMAL HIGH (ref 0–35)
AST: 44 U/L — ABNORMAL HIGH (ref 0–37)
Albumin: 4.1 g/dL (ref 3.5–5.2)
Alkaline Phosphatase: 101 U/L (ref 39–117)
BUN: 14 mg/dL (ref 6–23)
CO2: 27 mEq/L (ref 19–32)
Calcium: 9.7 mg/dL (ref 8.4–10.5)
Chloride: 107 mEq/L (ref 96–112)
Creatinine, Ser: 0.6 mg/dL (ref 0.40–1.20)
GFR: 100.58 mL/min (ref >60.00)
Glucose, Bld: 89 mg/dL (ref 70–99)
Potassium: 4.2 mEq/L (ref 3.5–5.1)
Sodium: 141 mEq/L (ref 135–145)
Total Bilirubin: 0.4 mg/dL (ref 0.2–1.2)
Total Protein: 6.9 g/dL (ref 6.0–8.3)

## 2019-07-08 LAB — VITAMIN D 25 HYDROXY (VIT D DEFICIENCY, FRACTURES): VITD: 39.96 ng/mL (ref 30.00–100.00)

## 2019-07-09 ENCOUNTER — Other Ambulatory Visit: Payer: Self-pay | Admitting: Family Medicine

## 2019-07-09 DIAGNOSIS — E7800 Pure hypercholesterolemia, unspecified: Secondary | ICD-10-CM

## 2019-07-09 MED ORDER — EZETIMIBE-SIMVASTATIN 10-10 MG PO TABS: 1.0000 | ORAL_TABLET | Freq: Every day | ORAL | 3 refills | Status: DC

## 2019-07-10 LAB — CYCLIC CITRUL PEPTIDE ANTIBODY, IGG: Cyclic Citrullin Peptide Ab: <16 UNITS

## 2019-07-10 LAB — SAR COV2 SEROLOGY (COVID19)AB(IGG),IA: SARS CoV2 AB IGG: NEGATIVE

## 2019-07-10 LAB — ANA: Anti Nuclear Antibody (ANA): NEGATIVE

## 2019-07-10 LAB — RHEUMATOID FACTOR: Rheumatoid fact SerPl-aCnc: <14 IU/mL (ref <14)

## 2019-07-12 ENCOUNTER — Ambulatory Visit: Payer: BC Managed Care – PPO | Admitting: Physician Assistant

## 2019-07-13 DIAGNOSIS — M9901 Segmental and somatic dysfunction of cervical region: Secondary | ICD-10-CM | POA: Diagnosis not present

## 2019-07-13 DIAGNOSIS — M47812 Spondylosis without myelopathy or radiculopathy, cervical region: Secondary | ICD-10-CM | POA: Diagnosis not present

## 2019-07-13 DIAGNOSIS — M9902 Segmental and somatic dysfunction of thoracic region: Secondary | ICD-10-CM | POA: Diagnosis not present

## 2019-07-13 DIAGNOSIS — M5032 Other cervical disc degeneration, mid-cervical region, unspecified level: Secondary | ICD-10-CM | POA: Diagnosis not present

## 2019-08-09 ENCOUNTER — Ambulatory Visit (INDEPENDENT_AMBULATORY_CARE_PROVIDER_SITE_OTHER): Payer: BC Managed Care – PPO | Admitting: Family Medicine

## 2019-08-09 ENCOUNTER — Other Ambulatory Visit: Payer: BC Managed Care – PPO

## 2019-08-09 ENCOUNTER — Other Ambulatory Visit: Payer: Self-pay

## 2019-08-09 ENCOUNTER — Encounter: Payer: Self-pay | Admitting: Family Medicine

## 2019-08-09 VITALS — BP 122/88 | HR 72 | Temp 98.0°F | Ht 63.0 in

## 2019-08-09 DIAGNOSIS — F50819 Binge eating disorder, unspecified: Secondary | ICD-10-CM

## 2019-08-09 DIAGNOSIS — F5081 Binge eating disorder: Secondary | ICD-10-CM

## 2019-08-09 DIAGNOSIS — E7800 Pure hypercholesterolemia, unspecified: Secondary | ICD-10-CM | POA: Diagnosis not present

## 2019-08-09 DIAGNOSIS — R7989 Other specified abnormal findings of blood chemistry: Secondary | ICD-10-CM | POA: Diagnosis not present

## 2019-08-09 LAB — COMPREHENSIVE METABOLIC PANEL
ALT: 31 U/L (ref 0–35)
AST: 19 U/L (ref 0–37)
Albumin: 4.1 g/dL (ref 3.5–5.2)
Alkaline Phosphatase: 78 U/L (ref 39–117)
BUN: 12 mg/dL (ref 6–23)
CO2: 27 mEq/L (ref 19–32)
Calcium: 9.5 mg/dL (ref 8.4–10.5)
Chloride: 107 mEq/L (ref 96–112)
Creatinine, Ser: 0.6 mg/dL (ref 0.40–1.20)
GFR: 100.56 mL/min (ref >60.00)
Glucose, Bld: 83 mg/dL (ref 70–99)
Potassium: 4 mEq/L (ref 3.5–5.1)
Sodium: 143 mEq/L (ref 135–145)
Total Bilirubin: 0.4 mg/dL (ref 0.2–1.2)
Total Protein: 6.9 g/dL (ref 6.0–8.3)

## 2019-08-09 LAB — LIPID PANEL
Cholesterol: 158 mg/dL (ref 0–200)
HDL: 53.5 mg/dL (ref >39.00)
LDL Cholesterol: 89 mg/dL (ref 0–99)
NonHDL: 104.14
Total CHOL/HDL Ratio: 3
Triglycerides: 75 mg/dL (ref 0.0–149.0)
VLDL: 15 mg/dL (ref 0.0–40.0)

## 2019-08-09 NOTE — Assessment & Plan Note (Signed)
Chronic problem.  Stable.  Check lipid panel today.  Continue current dose of Vytorin.

## 2019-08-09 NOTE — Patient Instructions (Signed)
It was very nice to see you today!  We will check lab work today.  Please continue your current medications.  I will place a referral for you to see the weight specialists.  Please come back next year when you would be due for your physical, or sooner if needed.  Take care, Dr Jerline Pain  Please try these tips to maintain a healthy lifestyle:   Eat at least 3 REAL meals and 1-2 snacks per day.  Aim for no more than 5 hours between eating.  If you eat breakfast, please do so within one hour of getting up.    Obtain twice as many fruits/vegetables as protein or carbohydrate foods for both lunch and dinner. (Half of each meal should be fruits/vegetables, one quarter protein, and one quarter starchy carbs)   Cut down on sweet beverages. This includes juice, soda, and sweet tea.    Exercise at least 150 minutes every week.

## 2019-08-09 NOTE — Progress Notes (Signed)
Please inform patient of the following:  Liver numbers are back to normal. Cholesterol levels are MUCH better.  Would like for her to continue her current medication and we can recheck again next year during her physical.  Algis Greenhouse. Jerline Pain, MD 08/09/2019 11:38 AM

## 2019-08-09 NOTE — Progress Notes (Signed)
   Chief Complaint:  Tina Aguilar is a 63 y.o. female who presents today with a chief complaint of elevated LFTs and to transfer care.   Assessment/Plan:  Hyperlipidemia Chronic problem.  Stable.  Check lipid panel today.  Continue current dose of Vytorin.  Binge eating disorder Chronic problem.  Stable.  Will place referral to weight management per patietn request.  Elevated LFTs Previously normal.  Possibly medication induced.  Recheck LFTs today.    Subjective:  HPI:  Elevated LFTs / Dyslipidemia  Patient had labs checked about a month ago.  Significant for LDL of 213, AST of 44, and ALT 65.  She is currently on Vytorin 10-10 once daily.  She has been tolerating well without side effects.  No myalgias.  Patient's history also significant for binge eating disorder.  She has been dealing with this for several years.  She has thought about seeing a counselor in the past but has not yet done so.  No obvious aggravating or alleviating factors.  She notes that she usually stress eats and her binge eating seems to be more of a comfort mechanism.  Symptoms are overall stable.  She is interested in seeing a specialist.   ROS: Per HPI  PMH: She reports that she quit smoking about 42 years ago. Her smoking use included cigarettes. She has a 5.00 pack-year smoking history. She has never used smokeless tobacco. She reports that she does not drink alcohol or use drugs.      Objective:  Physical Exam: BP 122/88   Pulse 72   Temp 98 F (36.7 C)   Ht 5\' 3"  (1.6 m)   SpO2 99%   BMI 28.17 kg/m   Wt Readings from Last 3 Encounters:  07/07/19 159 lb (72.1 kg)  07/10/18 159 lb (72.1 kg)  06/22/18 159 lb (72.1 kg)   Gen: NAD, resting comfortably CV: Regular rate and rhythm with no murmurs appreciated Pulm: Normal work of breathing, clear to auscultation bilaterally with no crackles, wheezes, or rhonchi GI: Normal bowel sounds present. Soft, Nontender, nondistended     Caleb M.  Jerline Pain, MD 08/09/2019 8:41 AM

## 2019-08-09 NOTE — Assessment & Plan Note (Signed)
Chronic problem.  Stable.  Will place referral to weight management per patietn request.

## 2019-08-17 DIAGNOSIS — M47812 Spondylosis without myelopathy or radiculopathy, cervical region: Secondary | ICD-10-CM | POA: Diagnosis not present

## 2019-08-17 DIAGNOSIS — M9902 Segmental and somatic dysfunction of thoracic region: Secondary | ICD-10-CM | POA: Diagnosis not present

## 2019-08-17 DIAGNOSIS — M9901 Segmental and somatic dysfunction of cervical region: Secondary | ICD-10-CM | POA: Diagnosis not present

## 2019-08-17 DIAGNOSIS — M5032 Other cervical disc degeneration, mid-cervical region, unspecified level: Secondary | ICD-10-CM | POA: Diagnosis not present

## 2019-08-23 ENCOUNTER — Ambulatory Visit (INDEPENDENT_AMBULATORY_CARE_PROVIDER_SITE_OTHER): Payer: BC Managed Care – PPO | Admitting: Family Medicine

## 2019-09-06 ENCOUNTER — Telehealth: Payer: Self-pay | Admitting: Family Medicine

## 2019-09-06 ENCOUNTER — Ambulatory Visit (INDEPENDENT_AMBULATORY_CARE_PROVIDER_SITE_OTHER): Payer: BC Managed Care – PPO | Admitting: Family Medicine

## 2019-09-06 NOTE — Telephone Encounter (Signed)
Left detailed message stating no one has called from this office.

## 2019-09-06 NOTE — Telephone Encounter (Signed)
See note

## 2019-09-06 NOTE — Telephone Encounter (Signed)
Patient returning a call to the office.

## 2019-12-18 ENCOUNTER — Ambulatory Visit: Payer: Self-pay

## 2019-12-27 ENCOUNTER — Ambulatory Visit: Payer: Self-pay | Attending: Internal Medicine

## 2019-12-27 DIAGNOSIS — Z23 Encounter for immunization: Secondary | ICD-10-CM

## 2019-12-27 NOTE — Progress Notes (Signed)
   Covid-19 Vaccination Clinic  Name:  Gillian Rittenour    MRN: [id number] DOB: 10-09-1956  12/27/2019  Ms. Labonte was observed post Covid-19 immunization for 15 minutes without incident. She was provided with Vaccine Information Sheet and instruction to access the V-Safe system.   Ms. Aburto was instructed to call 911 with any severe reactions post vaccine: Marland Kitchen Difficulty breathing  . Swelling of face and throat  . A fast heartbeat  . A bad rash all over body  . Dizziness and weakness   Immunizations Administered    Name Date Dose VIS Date Route   Pfizer COVID-19 Vaccine 12/27/2019 11:49 AM 0.3 mL 09/24/2019 Intramuscular   Manufacturer: [place]   Lot: [id number]   [place]: [id number]

## 2020-01-19 ENCOUNTER — Ambulatory Visit: Payer: Self-pay | Attending: Internal Medicine

## 2020-01-19 DIAGNOSIS — Z23 Encounter for immunization: Secondary | ICD-10-CM

## 2020-01-19 NOTE — Progress Notes (Signed)
   Covid-19 Vaccination Clinic  Name:  Tina Aguilar    MRN: [id number] DOB: 1956-08-22  01/19/2020  [name] was observed post Covid-19 immunization for 15 minutes without incident. She was provided with Vaccine Information Sheet and instruction to access the V-Safe system.   [name] was instructed to call 911 with any severe reactions post vaccine: Marland Kitchen Difficulty breathing  . Swelling of face and throat  . A fast heartbeat  . A bad rash all over body  . Dizziness and weakness   Immunizations Administered    Name Date Dose VIS Date Route   Pfizer COVID-19 Vaccine 01/19/2020 12:51 PM 0.3 mL 09/24/2019 Intramuscular   Manufacturer: [place]   Lot: [id number]   [place]: [id number]

## 2020-03-06 ENCOUNTER — Telehealth: Payer: Self-pay | Admitting: Family Medicine

## 2020-03-06 ENCOUNTER — Other Ambulatory Visit: Payer: Self-pay | Admitting: Family Medicine

## 2020-03-06 ENCOUNTER — Telehealth: Payer: Self-pay

## 2020-03-06 DIAGNOSIS — Z1231 Encounter for screening mammogram for malignant neoplasm of breast: Secondary | ICD-10-CM

## 2020-03-06 NOTE — Telephone Encounter (Signed)
Pt contacted Access Nurse on 5/22, Sat, c/o dizzy and covid symptoms and wanted tested. Pt was given triage number to [facility]. Virtual Saturday clinic was not at this office on this date so msg was not retrieved from VM until this morning.   Contacted pt who reports she was given this number. Pt reports she did do a drive-thru covid test and was negative and is feeling fine now. She denies any symptoms at this time and believes the dizziness could have been due to low BG due to a new diet. Advised a msg would be sent to her PCP and if anything else is needed to contact that office. Apologized to pt for the call not being returned. Pt appreciative and verbalized understanding.

## 2020-03-06 NOTE — Telephone Encounter (Signed)
Nurse Assessment Nurse: Rock Nephew, RN, Juliann Pulse Date/Time (Eastern Time): 03/04/2020 9:32:56 AM Confirm and document reason for call. If symptomatic, describe symptoms. ---Caller says that she is very dizzy. Is it COVID? Has the patient had close contact with a person known or suspected to have the novel coronavirus illness OR traveled / lives in area with major community spread (including international travel) in the last 14 days from the onset of symptoms? * If Asymptomatic, screen for exposure and travel within the last 14 days. ---No Does the patient have any new or worsening symptoms? ---Yes Will a triage be completed? ---Yes Related visit to physician within the last 2 weeks? ---No Does the PT have any chronic conditions? (i.e. diabetes, asthma, this includes High risk factors for pregnancy, etc.) ---No Is this a behavioral health or substance abuse call? ---No Guidelines Guideline Title Affirmed Question Affirmed Notes Nurse Date/Time (Eastern Time) Dizziness - Lightheadedness [1] MODERATE dizziness (e.g., interferes with normal activities) AND [2] has NOT been evaluated by physician for this (Exception: dizziness caused by heat exposure, sudden standing, or poor fluid intake) Rock Nephew, RN, Juliann Pulse 03/04/2020 9:33:58 AMPLEASE NOTE: All timestamps contained within this report are represented as [place] Standard Time. CONFIDENTIALTY NOTICE: This fax transmission is intended only for the addressee. It contains information that is legally privileged, confidential or otherwise protected from use or disclosure. If you are not the intended recipient, you are strictly prohibited from reviewing, disclosing, copying using or disseminating any of this information or taking any action in reliance on or regarding this information. If you have received this fax in error, please notify us immediately by telephone so that we can arrange for its return to [place]. Phone: [phone], Toll-Free: [phone],  Fax: [phone] Page: 2 of 2 Call Id: [id number] [place]. Time Eilene Ghazi Time) Disposition Final User 03/04/2020 9:37:22 AM See PCP within 24 Hours Yes Rock Nephew, RN, Gara Kroner Disagree/Comply Comply Caller Understands Yes PreDisposition Call Doctor Care Advice Given Per Guideline SEE PCP WITHIN 24 HOURS: * IF OFFICE WILL BE OPEN: You need to be seen within the next 24 hours. Call your doctor (or NP/PA) when the office opens and make an appointment. DRINK FLUIDS: * This will improve hydration and blood glucose. LIE DOWN AND REST: * Lie down with feet elevated for 1 hour. * This will improve circulation and increase blood flow to the brain. CALL BACK IF: * Passes out (faints) * You become worse. CARE ADVICE given per Dizziness (Adult) guideline.

## 2020-03-07 NOTE — Telephone Encounter (Signed)
FYI..triage note

## 2020-03-12 ENCOUNTER — Other Ambulatory Visit: Payer: Self-pay

## 2020-03-12 ENCOUNTER — Emergency Department (HOSPITAL_COMMUNITY): Payer: BC Managed Care – PPO

## 2020-03-12 ENCOUNTER — Emergency Department (HOSPITAL_COMMUNITY)
Admission: EM | Admit: 2020-03-12 | Discharge: 2020-03-12 | Disposition: A | Discharge status: Home / Self Care | Payer: BC Managed Care – PPO | Attending: Emergency Medicine | Admitting: Emergency Medicine

## 2020-03-12 ENCOUNTER — Encounter (HOSPITAL_COMMUNITY): Payer: Self-pay | Admitting: Emergency Medicine

## 2020-03-12 DIAGNOSIS — R42 Dizziness and giddiness: Secondary | ICD-10-CM | POA: Insufficient documentation

## 2020-03-12 DIAGNOSIS — Z87891 Personal history of nicotine dependence: Secondary | ICD-10-CM | POA: Insufficient documentation

## 2020-03-12 DIAGNOSIS — Z79899 Other long term (current) drug therapy: Secondary | ICD-10-CM | POA: Insufficient documentation

## 2020-03-12 LAB — CBC
HCT: 43.6 % (ref 36.0–46.0)
Hemoglobin: 14.3 g/dL (ref 12.0–15.0)
MCH: 30.2 pg (ref 26.0–34.0)
MCHC: 32.8 g/dL (ref 30.0–36.0)
MCV: 92.2 fL (ref 80.0–100.0)
Platelets: 256 10*3/uL (ref 150–400)
RBC: 4.73 MIL/uL (ref 3.87–5.11)
RDW: 13.3 % (ref 11.5–15.5)
WBC: 7.2 10*3/uL (ref 4.0–10.5)
nRBC: 0 % (ref 0.0–0.2)

## 2020-03-12 LAB — URINALYSIS, ROUTINE W REFLEX MICROSCOPIC
Bacteria, UA: NONE SEEN
Bilirubin Urine: NEGATIVE
Glucose, UA: NEGATIVE mg/dL
Hgb urine dipstick: NEGATIVE
Ketones, ur: NEGATIVE mg/dL
Nitrite: NEGATIVE
Protein, ur: NEGATIVE mg/dL
Specific Gravity, Urine: 1.013 (ref 1.005–1.030)
pH: 5 (ref 5.0–8.0)

## 2020-03-12 LAB — BASIC METABOLIC PANEL
Anion gap: 6 (ref 5–15)
BUN: 17 mg/dL (ref 8–23)
CO2: 27 mmol/L (ref 22–32)
Calcium: 9.4 mg/dL (ref 8.9–10.3)
Chloride: 110 mmol/L (ref 98–111)
Creatinine, Ser: 0.62 mg/dL (ref 0.44–1.00)
GFR calc Af Amer: >60 mL/min (ref >60)
GFR calc non Af Amer: >60 mL/min (ref >60)
Glucose, Bld: 89 mg/dL (ref 70–99)
Potassium: 4.4 mmol/L (ref 3.5–5.1)
Sodium: 143 mmol/L (ref 135–145)

## 2020-03-12 LAB — CBG MONITORING, ED: Glucose-Capillary: 86 mg/dL (ref 70–99)

## 2020-03-12 MED ORDER — SODIUM CHLORIDE 0.9% FLUSH
3.0000 mL | Freq: Once | INTRAVENOUS | Status: AC
  Administered 2020-03-12: 3 mL via INTRAVENOUS

## 2020-03-12 MED ORDER — MECLIZINE HCL 12.5 MG PO TABS
12.5000 mg | ORAL_TABLET | Freq: Three times a day (TID) | ORAL | 0 refills | Status: DC | PRN
Start: 2020-03-12 — End: 2020-07-20

## 2020-03-12 NOTE — ED Triage Notes (Signed)
Patient here from home reporting dizziness . Hx of same, states that she has had "an episode before". Denies n/v. Reports she "feels like the room is swaying".

## 2020-03-12 NOTE — Discharge Instructions (Signed)
Your work-up today is most consistent with BPPV  (benign paroxysmal positional vertigo).  The rest of your work-up today was reassuring.  You can start taking meclizine up to 3 times daily as needed for dizziness.  This medication can cause drowsiness so just be careful while taking it.  You can take half tablets if the medication seems strong.  You can also manage this kind of dizziness with physical therapy.  Drink plenty fluids and get rest.  Please follow-up with primary care provider for reevaluation of symptoms.  Return to the emergency department if any concerning signs or symptoms develop such as headaches, vision changes, weakness to 1 side of the body, loss of consciousness

## 2020-03-12 NOTE — ED Provider Notes (Signed)
[facility] DEPT Provider Note   CSN: [id number] Arrival date & time: 03/12/20  [id number]     History Chief Complaint  Patient presents with  . Dizziness    Tina Aguilar is a 64 y.o. female with history of hyperlipidemia presents for evaluation of acute onset, progressively improving intermittent dizziness.  She reports that symptoms began last night at around 7 or 8 PM while watching television.  She reports feeling of unsteadiness "like I am on a boat", but occasionally has experienced room spinning sensation as well.  Symptoms will typically come on with position changes such as rolling over in bed and ambulation and improved with rest.  She denies headaches, vision changes, tinnitus, numbness or weakness of the extremities, nausea, vomiting, abdominal pain, chest pain, or shortness of breath.  She reports that her symptoms have progressively improved since they started yes last night.  She had similar episode last week that was also short-lived.  She suspects that her symptoms could be secondary to using coconut oil to cook as she is only used coconut oil twice and the symptoms occurred each time.  Has not tried anything for her symptoms.  The history is provided by the patient.       Past Medical History:  Diagnosis Date  . Arthritis    hips, hands  . Frequency of urination   . History of adenomatous polyp of colon 08/28/2005  . Hyperlipidemia, mixed   . Left ureteral stone   . Spinal stenosis   . Wears glasses     Patient Active Problem List   Diagnosis Date Noted  . Binge eating disorder 05/27/2017  . Headache 05/27/2017  . Colon polyps 08/30/2010  . Hyperlipidemia 06/30/2007    Past Surgical History:  Procedure Laterality Date  . CESAREAN SECTION  1991;  1993  . COLONOSCOPY  last one 11-23-2010  . CYSTOSCOPY/URETEROSCOPY/HOLMIUM LASER/STENT PLACEMENT Left 05/06/2017   Procedure: CYSTOSCOPY/URETEROSCOPY/HOLMIUM LASER/STENT  PLACEMENT;  Surgeon: Nickie Retort, MD;  Location: [facility];  Service: Urology;  Laterality: Left;  . DILATION AND CURETTAGE OF UTERUS  1980's   w/ Suction for miscarriage  x2     OB History   No obstetric history on file.     Family History  Problem Relation Age of Onset  . Coronary artery disease Mother   . Arthritis Mother   . Congestive Heart Failure Father   . Heart attack Father   . Other Brother        coronary artery bypass graft age 107  . Hyperlipidemia Brother   . Diabetes Brother   . Hyperlipidemia Sister   . Diabetes Sister   . Heart disease Sister   . Hyperlipidemia Sister   . Hyperlipidemia Sister   . Heart attack Maternal Aunt        at age 40  . Breast cancer Neg Hx     Social History   Tobacco Use  . Smoking status: Former Smoker    Packs/day: 1.00    Years: 5.00    Pack years: 5.00    Types: Cigarettes    Quit date: 04/30/1977    Years since quitting: 42.8  . Smokeless tobacco: Never Used  Substance Use Topics  . Alcohol use: No    Alcohol/week: 0.0 standard drinks  . Drug use: No    Home Medications Prior to Admission medications   Medication Sig Start Date End Date Taking? Authorizing Provider  APPLE CIDER VINEGAR PO Take 1  tablet by mouth daily.   Yes [provider]  Cholecalciferol (VITAMIN D3 GUMMIES ADULT PO) Take by mouth daily.   Yes [provider]  ezetimibe-simvastatin (VYTORIN) 10-10 MG tablet Take 1 tablet by mouth at bedtime. 07/09/19  Yes Briscoe Deutscher, DO  Turmeric 1053 MG TABS Take by mouth.   Yes [provider]  meclizine (ANTIVERT) 12.5 MG tablet Take 1 tablet (12.5 mg total) by mouth 3 (three) times daily as needed for dizziness. 03/12/20   Rodell Perna A, PA-C    Allergies    Patient has no known allergies.  Review of Systems   Review of Systems  Constitutional: Negative for chills.  HENT: Negative for tinnitus.   Eyes: Negative for photophobia and visual  disturbance.  Respiratory: Negative for shortness of breath.   Cardiovascular: Negative for chest pain.  Gastrointestinal: Negative for abdominal pain, nausea and vomiting.  Neurological: Positive for dizziness. Negative for syncope, weakness, numbness and headaches.  All other systems reviewed and are negative.   Physical Exam Updated Vital Signs BP 137/82   Pulse 64   Temp 97.9 F (36.6 C) (Oral)   Resp 11   SpO2 100%   Physical Exam Vitals and nursing note reviewed.  Constitutional:      General: She is not in acute distress.    Appearance: She is well-developed.  HENT:     Head: Normocephalic and atraumatic.  Eyes:     General:        Right eye: No discharge.        Left eye: No discharge.     Extraocular Movements: Extraocular movements intact.     Conjunctiva/sclera: Conjunctivae normal.     Pupils: Pupils are equal, round, and reactive to light.     Comments: No nystagmus  Neck:     Vascular: No JVD.     Trachea: No tracheal deviation.  Cardiovascular:     Rate and Rhythm: Normal rate and regular rhythm.  Pulmonary:     Effort: Pulmonary effort is normal.     Breath sounds: Normal breath sounds.  Abdominal:     General: There is no distension.     Palpations: Abdomen is soft.     Tenderness: There is no abdominal tenderness. There is no guarding or rebound.  Musculoskeletal:     Cervical back: Neck supple.  Skin:    General: Skin is warm and dry.     Findings: No erythema.  Neurological:     Mental Status: She is alert.     Comments: Mental Status:  Alert, thought content appropriate, able to give a coherent history. Speech fluent without evidence of aphasia. Able to follow 2 step commands without difficulty.  Cranial Nerves:  II:  Peripheral visual fields grossly normal, pupils equal, round, reactive to light III,IV, VI: ptosis not present, extra-ocular motions intact bilaterally  V,VII: smile symmetric, facial light touch sensation equal VIII: hearing  grossly normal to voice  X: uvula elevates symmetrically  XI: bilateral shoulder shrug symmetric and strong XII: midline tongue extension without fassiculations Motor:  Normal tone. 5/5 strength of BUE and BLE major muscle groups including strong and equal grip strength and dorsiflexion/plantar flexion Sensory: light touch normal in all extremities. Cerebellar: Romberg sign present, normal finger-to-nose with bilateral upper extremities Gait: normal gait and balance. Able to walk on toes and heels with ease.     Psychiatric:        Behavior: Behavior normal.     ED Results /  Procedures / Treatments   Labs (all labs ordered are listed, but only abnormal results are displayed) Labs Reviewed  URINALYSIS, ROUTINE W REFLEX MICROSCOPIC - Abnormal; Notable for the following components:      Result Value   Leukocytes,Ua TRACE (*)    All other components within normal limits  BASIC METABOLIC PANEL  CBC  CBG MONITORING, ED    EKG None  Radiology CT Head Wo Contrast  Result Date: 03/12/2020 CLINICAL DATA:  Vertigo. EXAM: CT HEAD WITHOUT CONTRAST TECHNIQUE: Contiguous axial images were obtained from the base of the skull through the vertex without intravenous contrast. COMPARISON:  None. FINDINGS: Brain: No evidence of acute infarction, hemorrhage, hydrocephalus, extra-axial collection or mass lesion/mass effect. Vascular: No hyperdense vessel or unexpected calcification. Skull: Normal. Negative for fracture or focal lesion. Sinuses/Orbits: No acute finding. Other: None. IMPRESSION: Normal.  No cause for vertigo identified. Electronically Signed   By: Dorise Bullion III M.D   On: 03/12/2020 12:57    Procedures Procedures (including critical care time)  Medications Ordered in ED Medications  sodium chloride flush (NS) 0.9 % injection 3 mL (3 mLs Intravenous Given 03/12/20 1152)    ED Course  I have reviewed the triage vital signs and the nursing notes.  Pertinent labs & imaging  results that were available during my care of the patient were reviewed by me and considered in my medical decision making (see chart for details).    MDM Rules/Calculators/A&P                      Patient presenting for evaluation of intermittent dizziness that began after cooking with coconut oil.  She had a similar episode last week after cooking with coconut oil.  Symptoms have since significantly improved on my assessment.  She is afebrile, vital signs are stable.  She is nontoxic in appearance.  She is ambulatory in the ED without difficulty.  Romberg sign present on examination but no nystagmus.  Head CT shows no acute intracranial abnormalities with no evidence of mass, ICH, SAH, CVA.  Lab work reviewed and interpreted by myself shows no leukocytosis, no anemia, no metabolic derangements, no renal insufficiency.  UA does not suggest UTI or nephrolithiasis.  Her EKG shows no acute ischemic abnormalities and I have a low suspicion that her symptoms are cardiac in etiology.  History and physical examination suggestive of peripheral etiology of her vertigo.  At this time I have a low suspicion of posterior circulation stroke, vertebral artery dissection or other concerning life-threatening etiology of her symptoms.  On reevaluation she is resting comfortably in no apparent distress.  She reports her symptoms have entirely resolved.  Will discharge with small amount of meclizine to take as needed if symptoms persist.  She will follow-up with her PCP and her chiropractor on an outpatient basis for reevaluation of symptoms.  Discussed strict ED return precautions. Patient verbalized understanding of and agreement with plan and is safe for discharge home at this time.   Final Clinical Impression(s) / ED Diagnoses Final diagnoses:  Dizziness    Rx / DC Orders ED Discharge Orders         Ordered    meclizine (ANTIVERT) 12.5 MG tablet  3 times daily PRN     03/12/20 1416           Renita Papa, PA-C 03/12/20 1420    Charlesetta Shanks, MD 03/22/20 1351

## 2020-03-12 NOTE — ED Notes (Signed)
Urine culture in the main lab 

## 2020-03-14 ENCOUNTER — Telehealth: Payer: Self-pay | Admitting: Family Medicine

## 2020-03-14 NOTE — Telephone Encounter (Signed)
Nurse Assessment Nurse: Enrigue Catena Date/Time Eilene Ghazi Time): 03/12/2020 8:43:00 AM Confirm and document reason for call. If symptomatic, describe symptoms. ---Caller states she is dizzy. She was dizzy last weekend that lasted about 24 hours, but it went away during the week. She had a negative COVID test. Last night she began to get dizzy again, and it has continued into this morning. She has occasional chest pain or heart palpitations about once a month, and it hasn't occurred for a while. No fever. Has the patient had close contact with a person known or suspected to have the novel coronavirus illness OR traveled / lives in area with major community spread (including international travel) in the last 14 days from the onset of symptoms? * If Asymptomatic, screen for exposure and travel within the last 14 days. ---No Does the patient have any new or worsening symptoms? ---Yes Will a triage be completed? ---Yes Related visit to physician within the last 2 weeks? ---Yes Does the PT have any chronic conditions? (i.e. diabetes, asthma, this includes High risk factors for pregnancy, etc.) ---Yes List chronic conditions. ---osteoarthritis, stenosis Is this a behavioral health or substance abuse call? ---No Guidelines Guideline Title Affirmed Question Affirmed Notes Nurse Date/Time (Eastern Time) Dizziness - Lightheadedness [1] MODERATE dizziness (e.g., interferes Enrigue Catena 03/12/2020 8:48:18 AMPLEASE NOTE: All timestamps contained within this report are represented as [place] Standard Time. CONFIDENTIALTY NOTICE: This fax transmission is intended only for the addressee. It contains information that is legally privileged, confidential or otherwise protected from use or disclosure. If you are not the intended recipient, you are strictly prohibited from reviewing, disclosing, copying using or disseminating any of this information or taking any action in reliance on or regarding  this information. If you have received this fax in error, please notify us immediately by telephone so that we can arrange for its return to [place]. Phone: [phone], Toll-Free: [phone], Fax: [phone] Page: 2 of 2 Call Id: [id number] Guidelines Guideline Title Affirmed Question Affirmed Notes Nurse Date/Time Eilene Ghazi Time) with normal activities) AND [2] has NOT been evaluated by physician for this (Exception: dizziness caused by heat exposure, sudden standing, or poor fluid intake) Disp. Time Eilene Ghazi Time) Disposition Final User 03/12/2020 8:51:32 AM See PCP within 24 Hours Yes Enrigue Catena Caller Disagree/Comply Disagree Caller Understands Yes PreDisposition Home Care Care Advice Given Per Guideline SEE PCP WITHIN 24 HOURS: * IF OFFICE WILL BE CLOSED: You need to be seen within the next 24 hours. A clinic or an urgent care center is often a good source of care if your doctor's office is closed or you can't get an appointment. DRINK FLUIDS: * Drink several glasses of fruit juice, other clear fluids or water. LIE DOWN AND REST: * Lie down with feet elevated for 1 hour. * This will improve circulation and increase blood flow to the brain. CALL BACK IF: * You become worse. CARE ADVICE given per Dizziness (Adult) guideline. * Passes out (faints

## 2020-03-17 ENCOUNTER — Encounter: Payer: Self-pay | Admitting: Family Medicine

## 2020-03-17 ENCOUNTER — Other Ambulatory Visit: Payer: Self-pay

## 2020-03-17 ENCOUNTER — Ambulatory Visit: Payer: BC Managed Care – PPO | Admitting: Family Medicine

## 2020-03-17 VITALS — BP 113/73 | HR 65 | Temp 97.9°F | Ht 63.0 in

## 2020-03-17 DIAGNOSIS — R42 Dizziness and giddiness: Secondary | ICD-10-CM | POA: Insufficient documentation

## 2020-03-17 DIAGNOSIS — M1990 Unspecified osteoarthritis, unspecified site: Secondary | ICD-10-CM | POA: Diagnosis not present

## 2020-03-17 MED ORDER — DICLOFENAC SODIUM 75 MG PO TBEC: 75.0000 mg | DELAYED_RELEASE_TABLET | Freq: Two times a day (BID) | ORAL | 5 refills | Status: DC

## 2020-03-17 NOTE — Patient Instructions (Signed)
It was very nice to see you today!  He had benign positional vertigo.  Please work on the Psychiatric nurse.  I will send in diclofenac for you.  I will see back in a couple of months for your annual physical.  Take care, Dr Jerline Pain  Please try these tips to maintain a healthy lifestyle:   Eat at least 3 REAL meals and 1-2 snacks per day.  Aim for no more than 5 hours between eating.  If you eat breakfast, please do so within one hour of getting up.    Each meal should contain half fruits/vegetables, one quarter protein, and one quarter carbs (no bigger than a computer mouse)   Cut down on sweet beverages. This includes juice, soda, and sweet tea.     Drink at least 1 glass of water with each meal and aim for at least 8 glasses per day   Exercise at least 150 minutes every week.   How to Perform the Epley Maneuver The Epley maneuver is an exercise that relieves symptoms of vertigo. Vertigo is the feeling that you or your surroundings are moving when they are not. When you feel vertigo, you may feel like the room is spinning and have trouble walking. Dizziness is a little different than vertigo. When you are dizzy, you may feel unsteady or light-headed. You can do this maneuver at home whenever you have symptoms of vertigo. You can do it up to 3 times a day until your symptoms go away. Even though the Epley maneuver may relieve your vertigo for a few weeks, it is possible that your symptoms will return. This maneuver relieves vertigo, but it does not relieve dizziness. What are the risks? If it is done correctly, the Epley maneuver is considered safe. Sometimes it can lead to dizziness or nausea that goes away after a short time. If you develop other symptoms, such as changes in vision, weakness, or numbness, stop doing the maneuver and call your health care provider. How to perform the Epley maneuver 1. Sit on the edge of a bed or table with your back straight and your legs extended  or hanging over the edge of the bed or table. 2. Turn your head halfway toward the affected ear or side. 3. Lie backward quickly with your head turned until you are lying flat on your back. You may want to position a pillow under your shoulders. 4. Hold this position for 30 seconds. You may experience an attack of vertigo. This is normal. 5. Turn your head to the opposite direction until your unaffected ear is facing the floor. 6. Hold this position for 30 seconds. You may experience an attack of vertigo. This is normal. Hold this position until the vertigo stops. 7. Turn your whole body to the same side as your head. Hold for another 30 seconds. 8. Sit back up. You can repeat this exercise up to 3 times a day. Follow these instructions at home:  After doing the Epley maneuver, you can return to your normal activities.  Ask your health care provider if there is anything you should do at home to prevent vertigo. He or she may recommend that you: ? Keep your head raised (elevated) with two or more pillows while you sleep. ? Do not sleep on the side of your affected ear. ? Get up slowly from bed. ? Avoid sudden movements during the day. ? Avoid extreme head movement, like looking up or bending over. Contact a health care provider  if:  Your vertigo gets worse.  You have other symptoms, including: ? Nausea. ? Vomiting. ? Headache. Get help right away if:  You have vision changes.  You have a severe or worsening headache or neck pain.  You cannot stop vomiting.  You have new numbness or weakness in any part of your body. Summary  Vertigo is the feeling that you or your surroundings are moving when they are not.  The Epley maneuver is an exercise that relieves symptoms of vertigo.  If the Epley maneuver is done correctly, it is considered safe. You can do it up to 3 times a day. This information is not intended to replace advice given to you by your health care provider. Make sure  you discuss any questions you have with your health care provider. Document Revised: 09/12/2017 Document Reviewed: 08/20/2016 Elsevier Patient Education  2020 [employer].

## 2020-03-17 NOTE — Assessment & Plan Note (Signed)
No red flags.  Likely BPPV.  She will follow-up with chiropractor.  Discussed Epley maneuvers.  She will use meclizine if needed.

## 2020-03-17 NOTE — Progress Notes (Signed)
   Tina Aguilar is a 64 y.o. female who presents today for an office visit.  Assessment/Plan:  Chronic Problems Addressed Today: Vertigo No red flags.  Likely BPPV.  She will follow-up with chiropractor.  Discussed Epley maneuvers.  She will use meclizine if needed.   Osteoarthritis Stable.  Will send in diclofenac 75 mg twice daily as needed.  Would consider trial of meloxicam or Celebrex depending on response to diclofenac.     Subjective:  HPI:  Patient went to the ED 5 days ago with sudden onset dizziness.  Had work-up including head CT which was negative.  She was diagnosed with peripheral vertigo.  She was discharged home.  Symptoms had resolved at the time of discharge.   She has had not any weakness or numbness. No hearing changes. No tinnitus. No ringing in her ears. No nausea or vomiting.    She has mostly history of arthritis in hips.  She has been taking Aleve that helps modestly though it does not seem to be as effective lately.  Worse with certain motions.       Objective:  Physical Exam: BP 113/73   Pulse 65   Temp 97.9 F (36.6 C) (Temporal)   Ht 5\' 3"  (1.6 m)   SpO2 98%   BMI 28.17 kg/m   Gen: No acute distress, resting comfortably CV: Regular rate and rhythm with no murmurs appreciated Pulm: Normal work of breathing, clear to auscultation bilaterally with no crackles, wheezes, or rhonchi Neuro: Grossly normal, moves all extremities Psych: Normal affect and thought content      [name] M. Jerline Pain, MD 03/17/2020 12:00 PM

## 2020-03-17 NOTE — Assessment & Plan Note (Signed)
Stable.  Will send in diclofenac 75 mg twice daily as needed.  Would consider trial of meloxicam or Celebrex depending on response to diclofenac.

## 2020-03-20 ENCOUNTER — Telehealth: Payer: Self-pay | Admitting: Family Medicine

## 2020-03-20 NOTE — Telephone Encounter (Signed)
Ok for her to use intermittently as needed.  Tina Aguilar. Jerline Pain, MD 03/20/2020 12:55 PM

## 2020-03-20 NOTE — Telephone Encounter (Signed)
Patient called in and stated that diclofenac (VOLTAREN) 75 MG EC tablet is working wonderful, but she wants to make sure that it wont hurt in the long run. Patient stated before the medication she couldn't even use the vacuum. Please advise.

## 2020-03-20 NOTE — Telephone Encounter (Signed)
Patient notified she voices understanding

## 2020-03-20 NOTE — Telephone Encounter (Signed)
Please advise 

## 2020-04-19 ENCOUNTER — Other Ambulatory Visit: Payer: Self-pay

## 2020-04-19 ENCOUNTER — Ambulatory Visit (INDEPENDENT_AMBULATORY_CARE_PROVIDER_SITE_OTHER): Payer: Medicare PPO | Admitting: Orthopaedic Surgery

## 2020-04-19 ENCOUNTER — Other Ambulatory Visit: Payer: Self-pay | Admitting: Orthopaedic Surgery

## 2020-04-19 ENCOUNTER — Encounter: Payer: Self-pay | Admitting: Orthopaedic Surgery

## 2020-04-19 ENCOUNTER — Ambulatory Visit: Payer: Self-pay

## 2020-04-19 VITALS — Ht 63.25 in | Wt 162.0 lb

## 2020-04-19 DIAGNOSIS — M160 Bilateral primary osteoarthritis of hip: Secondary | ICD-10-CM | POA: Diagnosis not present

## 2020-04-19 DIAGNOSIS — M25551 Pain in right hip: Secondary | ICD-10-CM

## 2020-04-19 NOTE — Progress Notes (Signed)
Office Visit Note   Patient: Tina Aguilar           Date of Birth: [birth year]           MRN: [id number] Visit Date: 04/19/2020              Requested by: Vivi Barrack, MD [address] [place],  [place] [address] PCP: Vivi Barrack, MD   Assessment & Plan: Visit Diagnoses:  1. Primary osteoarthritis of both hips     Plan: Tina Aguilar has evidence of bilateral hip osteoarthritis with more involvement in the right than the left hip.  There is narrowing of the joint space, subchondral sclerosis and peripheral osteophytes on the right and to a lesser extent on the left.  The head is completely covered right hip with extraneous acetabular bone.  There is no protrusio.  Long discussion regarding diagnosis, what she can expect over time and treatment options.  This included hip replacement.  For the moment she is going to continue with home exercises and a combination of Voltaren and Voltaren gel per her primary care physician.  She does have a set of exercises.  Follow-Up Instructions: Return if symptoms worsen or fail to improve.   Orders:  No orders of the defined types were placed in this encounter.  No orders of the defined types were placed in this encounter.     Procedures: No procedures performed   Clinical Data: No additional findings.   Subjective: Chief Complaint  Patient presents with  . Left Hip - Pain  . Right Hip - Pain  Patient presents today for bilateral hip pain. She said that she has had pain for years, with both hips hurting equally the same. She said that she cannot abduct her legs like most people because they are "stuck". She has pain in her groin on both sides. She takes Voltaren orally twice daily and uses the gel as needed. She had x-rays of both her hips done at [facility], but did not bring those with her to today's appointment. She does not want new x-rays today. Her prior films are not available for review so she did  agree to having new films which were performed.  She is having some trouble when she goes from a sitting to a standing position with significant groin pain and stiffness and some loss of motion.  She does have history of low back pain with stenosis  HPI  Review of Systems   Objective: Vital Signs: Ht 5' 3.25" (1.607 m)   Wt 162 lb (73.5 kg)   BMI 28.47 kg/m   Physical Exam Constitutional:      Appearance: She is well-developed.  Eyes:     Pupils: Pupils are equal, round, and reactive to light.  Pulmonary:     Effort: Pulmonary effort is normal.  Skin:    General: Skin is warm and dry.  Neurological:     Mental Status: She is alert and oriented to person, place, and time.  Psychiatric:        Behavior: Behavior normal.     Ortho Exam awake alert and oriented x3.  Comfortable sitting.  Does have significant decreased range of motion of both hips particularly on the right is more loss of internal and external rotation.  There is pain on the extreme of that motion on the right.  No distal edema.  Neurologically intact.  Straight leg raise negative.  Painless percussion of the lumbar spine  Specialty Comments:  No specialty comments available.  Imaging: XR HIPS BILAT W OR W/O PELVIS 2V  Result Date: 04/19/2020 AP pelvis and lateral both hips were performed.  There is significant arthritis involving both hips but more so on the right.  The head is completely covered on the right with osteophytes superiorly and inferiorly, narrowing of the joint space and subchondral cyst formation.  Extraneous acetabular bone completely covers the head to the level of the head neck junction.  No protrusio    PMFS History: Patient Active Problem List   Diagnosis Date Noted  . Osteoarthritis 03/17/2020  . Vertigo 03/17/2020  . Binge eating disorder 05/27/2017  . Headache 05/27/2017  . Colon polyps 08/30/2010  . Hyperlipidemia 06/30/2007   Past Medical History:  Diagnosis Date  . Arthritis     hips, hands  . Frequency of urination   . History of adenomatous polyp of colon 08/28/2005  . Hyperlipidemia, mixed   . Left ureteral stone   . Spinal stenosis   . Vertigo   . Wears glasses     Family History  Problem Relation Age of Onset  . Coronary artery disease Mother   . Arthritis Mother   . Congestive Heart Failure Father   . Heart attack Father   . Other Brother        coronary artery bypass graft age 41  . Hyperlipidemia Brother   . Diabetes Brother   . Hyperlipidemia Sister   . Diabetes Sister   . Heart disease Sister   . Hyperlipidemia Sister   . Hyperlipidemia Sister   . Heart attack Maternal Aunt        at age 42  . Breast cancer Neg Hx     Past Surgical History:  Procedure Laterality Date  . CESAREAN SECTION  1991;  1993  . COLONOSCOPY  last one 11-23-2010  . CYSTOSCOPY/URETEROSCOPY/HOLMIUM LASER/STENT PLACEMENT Left 05/06/2017   Procedure: CYSTOSCOPY/URETEROSCOPY/HOLMIUM LASER/STENT PLACEMENT;  Surgeon: Nickie Retort, MD;  Location: [facility];  Service: Urology;  Laterality: Left;  . DILATION AND CURETTAGE OF UTERUS  1980's   w/ Suction for miscarriage  x2   Social History   Occupational History  . Not on file  Tobacco Use  . Smoking status: Former Smoker    Packs/day: 1.00    Years: 5.00    Pack years: 5.00    Types: Cigarettes    Quit date: 04/30/1977    Years since quitting: 43.0  . Smokeless tobacco: Never Used  Substance and Sexual Activity  . Alcohol use: No    Alcohol/week: 0.0 standard drinks  . Drug use: No  . Sexual activity: Not on file

## 2020-05-17 ENCOUNTER — Telehealth: Payer: Self-pay | Admitting: Family Medicine

## 2020-05-17 NOTE — Telephone Encounter (Signed)
Patient called in and and stated she saw a commercial for [employer] and wanted to see what Dr. Jerline Pain thought of her getting this done. Patient stated she really was unsure since it is a lot of money. Patient said her Arthritis in her hip is rather advanced and rather not get a hip replacement if this would be a better solution.

## 2020-05-17 NOTE — Telephone Encounter (Signed)
Please advise 

## 2020-05-18 NOTE — Telephone Encounter (Signed)
Left message on voicemail to call office.  

## 2020-05-18 NOTE — Telephone Encounter (Signed)
Little evidence that I'm aware of for [employer] improving long term symptoms. Would not recommend it at this time, though would recommend referral to sports medicine and/or physical therapy if she wishes.  Algis Greenhouse. Jerline Pain, MD 05/18/2020 10:38 AM

## 2020-05-19 NOTE — Telephone Encounter (Signed)
Left voice message for patient to call clinic.  

## 2020-05-19 NOTE — Telephone Encounter (Signed)
ka

## 2020-05-22 NOTE — Telephone Encounter (Signed)
Called and l/m to call office this it third time that we have left message ok to mail letter with information or send in my chart.

## 2020-05-23 NOTE — Telephone Encounter (Signed)
[place] with me.   Algis Greenhouse. Jerline Pain, MD 05/23/2020 2:32 PM

## 2020-05-24 ENCOUNTER — Encounter: Payer: Self-pay | Admitting: Family Medicine

## 2020-05-24 NOTE — Telephone Encounter (Signed)
fyi

## 2020-05-24 NOTE — Telephone Encounter (Signed)
Letter sent in my chart with information

## 2020-06-12 ENCOUNTER — Other Ambulatory Visit: Payer: Self-pay

## 2020-06-12 ENCOUNTER — Ambulatory Visit
Admission: RE | Admit: 2020-06-12 | Discharge: 2020-06-12 | Disposition: A | Discharge status: Home / Self Care | Payer: Medicare PPO | Source: Ambulatory Visit | Attending: Family Medicine | Admitting: Family Medicine

## 2020-06-12 DIAGNOSIS — Z1231 Encounter for screening mammogram for malignant neoplasm of breast: Secondary | ICD-10-CM

## 2020-06-23 ENCOUNTER — Telehealth: Payer: Self-pay

## 2020-06-23 ENCOUNTER — Other Ambulatory Visit: Payer: Self-pay | Admitting: *Deleted

## 2020-06-23 MED ORDER — DICLOFENAC SODIUM 75 MG PO TBEC: 75.0000 mg | DELAYED_RELEASE_TABLET | Freq: Two times a day (BID) | ORAL | 5 refills | Status: DC

## 2020-06-23 NOTE — Telephone Encounter (Signed)
.. °  LAST APPOINTMENT DATE: 05/17/2020   NEXT APPOINTMENT DATE:@10 /04/2020  MEDICATION:diclofenac (VOLTAREN) 75 MG EC tablet   [place], [place]  **Let patient know to contact pharmacy at the end of the day to make sure medication is ready. **  ** Please notify patient to allow 48-72 hours to process**  **Encourage patient to contact the pharmacy for refills or they can request refills through [facility]**  CLINICAL FILLS OUT ALL BELOW:   LAST REFILL:  QTY:  REFILL DATE:    OTHER COMMENTS:    Okay for refill?  Please advise

## 2020-06-27 ENCOUNTER — Telehealth: Payer: Self-pay | Admitting: Orthopaedic Surgery

## 2020-06-27 NOTE — Telephone Encounter (Signed)
Called patient. No answer. Left message that I would fax order for physical therapy for both hips.

## 2020-06-27 NOTE — Telephone Encounter (Signed)
PW patient

## 2020-06-27 NOTE — Telephone Encounter (Signed)
Patient called requesting to do outpatient PT. Patient states in she was recommended from her chiropractor. [facility] informed her that her ortho doctor would have to send referral. Patient is asking that referral be sent to [facility] phone number [phone]. Physical therapist name is Eoim. Patient phone number is [phone]. Please call patient about this matter

## 2020-06-27 NOTE — Telephone Encounter (Signed)
Ok for PT both hips

## 2020-06-27 NOTE — Telephone Encounter (Signed)
Please advise. You saw this patient on 04/19/20 for bilateral hip OA.

## 2020-06-28 ENCOUNTER — Other Ambulatory Visit: Payer: Self-pay

## 2020-06-28 ENCOUNTER — Telehealth: Payer: Self-pay | Admitting: Orthopaedic Surgery

## 2020-06-28 DIAGNOSIS — M25551 Pain in right hip: Secondary | ICD-10-CM

## 2020-06-28 DIAGNOSIS — M160 Bilateral primary osteoarthritis of hip: Secondary | ICD-10-CM

## 2020-06-28 NOTE — Telephone Encounter (Signed)
Pt called wanting to make sure that ROM and balance would be worked on during PT and she also wanted to make sure that the PT would in addition target her lower back.  [phone]

## 2020-06-28 NOTE — Telephone Encounter (Signed)
Order has been faxed to [phone]

## 2020-06-28 NOTE — Telephone Encounter (Signed)
Called patient. No answer. Left message that I would send this referral to [facility]. I cannot order anything for her back since we have not seen her for her back in two years.

## 2020-07-18 ENCOUNTER — Encounter: Payer: BC Managed Care – PPO | Admitting: Physician Assistant

## 2020-07-20 ENCOUNTER — Other Ambulatory Visit: Payer: Self-pay

## 2020-07-20 ENCOUNTER — Ambulatory Visit (INDEPENDENT_AMBULATORY_CARE_PROVIDER_SITE_OTHER): Payer: Medicare PPO | Admitting: Family Medicine

## 2020-07-20 ENCOUNTER — Encounter: Payer: Self-pay | Admitting: Family Medicine

## 2020-07-20 VITALS — BP 118/81 | HR 72 | Temp 97.7°F | Ht 63.25 in | Wt 161.4 lb

## 2020-07-20 DIAGNOSIS — Z23 Encounter for immunization: Secondary | ICD-10-CM

## 2020-07-20 DIAGNOSIS — Z0001 Encounter for general adult medical examination with abnormal findings: Secondary | ICD-10-CM

## 2020-07-20 DIAGNOSIS — R739 Hyperglycemia, unspecified: Secondary | ICD-10-CM

## 2020-07-20 DIAGNOSIS — M160 Bilateral primary osteoarthritis of hip: Secondary | ICD-10-CM

## 2020-07-20 DIAGNOSIS — Z13220 Encounter for screening for lipoid disorders: Secondary | ICD-10-CM

## 2020-07-20 DIAGNOSIS — E559 Vitamin D deficiency, unspecified: Secondary | ICD-10-CM

## 2020-07-20 DIAGNOSIS — E2839 Other primary ovarian failure: Secondary | ICD-10-CM

## 2020-07-20 NOTE — Assessment & Plan Note (Signed)
Stable. Continue management per orthopedics and PT. Has had significant improvement with PT.

## 2020-07-20 NOTE — Patient Instructions (Signed)
It was very nice to see you today!  We will give you your pneumonia vaccine today.  We will check blood work today.  We will order a bone density scan.  I will see you back in a year for your next physical, or sooner if needed.  Take care, Dr Jerline Pain  Please try these tips to maintain a healthy lifestyle:   Eat at least 3 REAL meals and 1-2 snacks per day.  Aim for no more than 5 hours between eating.  If you eat breakfast, please do so within one hour of getting up.    Each meal should contain half fruits/vegetables, one quarter protein, and one quarter carbs (no bigger than a computer mouse)   Cut down on sweet beverages. This includes juice, soda, and sweet tea.     Drink at least 1 glass of water with each meal and aim for at least 8 glasses per day   Exercise at least 150 minutes every week.    Preventive Care 65 Years and Older, Female Preventive care refers to lifestyle choices and visits with your health care provider that can promote health and wellness. This includes:  A yearly physical exam. This is also called an annual well check.  Regular dental and eye exams.  Immunizations.  Screening for certain conditions.  Healthy lifestyle choices, such as diet and exercise. What can I expect for my preventive care visit? Physical exam Your health care provider will check:  Height and weight. These may be used to calculate body mass index (BMI), which is a measurement that tells if you are at a healthy weight.  Heart rate and blood pressure.  Your skin for abnormal spots. Counseling Your health care provider may ask you questions about:  Alcohol, tobacco, and drug use.  Emotional well-being.  Home and relationship well-being.  Sexual activity.  Eating habits.  History of falls.  Memory and ability to understand (cognition).  Work and work Statistician.  Pregnancy and menstrual history. What immunizations do I need?  Influenza (flu)  vaccine  This is recommended every year. Tetanus, diphtheria, and pertussis (Tdap) vaccine  You may need a Td booster every 10 years. Varicella (chickenpox) vaccine  You may need this vaccine if you have not already been vaccinated. Zoster (shingles) vaccine  You may need this after age 38. Pneumococcal conjugate (PCV13) vaccine  One dose is recommended after age 55. Pneumococcal polysaccharide (PPSV23) vaccine  One dose is recommended after age 32. Measles, mumps, and rubella (MMR) vaccine  You may need at least one dose of MMR if you were born in 1957 or later. You may also need a second dose. Meningococcal conjugate (MenACWY) vaccine  You may need this if you have certain conditions. Hepatitis A vaccine  You may need this if you have certain conditions or if you travel or work in places where you may be exposed to hepatitis A. Hepatitis B vaccine  You may need this if you have certain conditions or if you travel or work in places where you may be exposed to hepatitis B. Haemophilus influenzae type b (Hib) vaccine  You may need this if you have certain conditions. You may receive vaccines as individual doses or as more than one vaccine together in one shot (combination vaccines). Talk with your health care provider about the risks and benefits of combination vaccines. What tests do I need? Blood tests  Lipid and cholesterol levels. These may be checked every 5 years, or more frequently depending  on your overall health.  Hepatitis C test.  Hepatitis B test. Screening  Lung cancer screening. You may have this screening every year starting at age 25 if you have a 30-pack-year history of smoking and currently smoke or have quit within the past 15 years.  Colorectal cancer screening. All adults should have this screening starting at age 68 and continuing until age 64. Your health care provider may recommend screening at age 65 if you are at increased risk. You will have  tests every 1-10 years, depending on your results and the type of screening test.  Diabetes screening. This is done by checking your blood sugar (glucose) after you have not eaten for a while (fasting). You may have this done every 1-3 years.  Mammogram. This may be done every 1-2 years. Talk with your health care provider about how often you should have regular mammograms.  BRCA-related cancer screening. This may be done if you have a family history of breast, ovarian, tubal, or peritoneal cancers. Other tests  Sexually transmitted disease (STD) testing.  Bone density scan. This is done to screen for osteoporosis. You may have this done starting at age 62. Follow these instructions at home: Eating and drinking  Eat a diet that includes fresh fruits and vegetables, whole grains, lean protein, and low-fat dairy products. Limit your intake of foods with high amounts of sugar, saturated fats, and salt.  Take vitamin and mineral supplements as recommended by your health care provider.  Do not drink alcohol if your health care provider tells you not to drink.  If you drink alcohol: ? Limit how much you have to 0-1 drink a day. ? Be aware of how much alcohol is in your drink. In the [place] one drink equals one 12 oz bottle of beer (355 mL), one 5 oz glass of wine (148 mL), or one 1 oz glass of hard liquor (44 mL). Lifestyle  Take daily care of your teeth and gums.  Stay active. Exercise for at least 30 minutes on 5 or more days each week.  Do not use any products that contain nicotine or tobacco, such as cigarettes, e-cigarettes, and chewing tobacco. If you need help quitting, ask your health care provider.  If you are sexually active, practice safe sex. Use a condom or other form of protection in order to prevent STIs (sexually transmitted infections).  Talk with your health care provider about taking a low-dose aspirin or statin. What's next?  Go to your health care provider once a  year for a well check visit.  Ask your health care provider how often you should have your eyes and teeth checked.  Stay up to date on all vaccines. This information is not intended to replace advice given to you by your health care provider. Make sure you discuss any questions you have with your health care provider. Document Revised: 09/24/2018 Document Reviewed: 09/24/2018 Elsevier Patient Education  2020 [employer].

## 2020-07-20 NOTE — Progress Notes (Signed)
Chief Complaint:  Tina Aguilar is a 64 y.o. female who presents today for her annual comprehensive physical exam.    Assessment/Plan:  Chronic Problems Addressed Today: Osteoarthritis Stable. Continue management per orthopedics and PT. Has had significant improvement with PT.   Preventative Healthcare: Check CBC, CMET, TSH, lipid panel, A1c.  She is finished with Pap smears.  Due for colonoscopy next year.  Will order bone density scan.  Patient Counseling(The following topics were reviewed and/or handout was given):  -Nutrition: Stressed importance of moderation in sodium/caffeine intake, saturated fat and cholesterol, caloric balance, sufficient intake of fresh fruits, vegetables, and fiber.  -Stressed the importance of regular exercise.   -Substance Abuse: Discussed cessation/primary prevention of tobacco, alcohol, or other drug use; driving or other dangerous activities under the influence; availability of treatment for abuse.   -Injury prevention: Discussed safety belts, safety helmets, smoke detector, smoking near bedding or upholstery.   -Sexuality: Discussed sexually transmitted diseases, partner selection, use of condoms, avoidance of unintended pregnancy and contraceptive alternatives.   -Dental health: Discussed importance of regular tooth brushing, flossing, and dental visits.  -Health maintenance and immunizations reviewed. Please refer to Health maintenance section.  Return to care in 1 year for next preventative visit.     Subjective:  HPI:  She has no acute complaints today.   Lifestyle Diet: Cutting out sugar and processed foods.  Exercise: Working with physical therapy.   Depression screen [facility] 2/9 07/07/2019  Decreased Interest 0  Down, Depressed, Hopeless 0  PHQ - 2 Score 0  Altered sleeping 1  Tired, decreased energy 0  Change in appetite 1  Feeling bad or failure about yourself  1  Trouble concentrating 0  Moving slowly or fidgety/restless 0   Suicidal thoughts 0  PHQ-9 Score 3  Difficult doing work/chores Not difficult at all   Health Maintenance Due  Topic Date Due  . DEXA SCAN  Never done  . PNA vac Low Risk Adult (1 of 2 - PCV13) Never done    ROS: Per HPI, otherwise a complete review of systems was negative.   PMH:  The following were reviewed and entered/updated in epic: Past Medical History:  Diagnosis Date  . Arthritis    hips, hands  . Frequency of urination   . History of adenomatous polyp of colon 08/28/2005  . Hyperlipidemia, mixed   . Left ureteral stone   . Spinal stenosis   . Vertigo   . Wears glasses    Patient Active Problem List   Diagnosis Date Noted  . Osteoarthritis 03/17/2020  . Vertigo 03/17/2020  . Binge eating disorder 05/27/2017  . Headache 05/27/2017  . Colon polyps 08/30/2010  . Hyperlipidemia 06/30/2007   Past Surgical History:  Procedure Laterality Date  . CESAREAN SECTION  1991;  1993  . COLONOSCOPY  last one 11-23-2010  . CYSTOSCOPY/URETEROSCOPY/HOLMIUM LASER/STENT PLACEMENT Left 05/06/2017   Procedure: CYSTOSCOPY/URETEROSCOPY/HOLMIUM LASER/STENT PLACEMENT;  Surgeon: Nickie Retort, MD;  Location: [facility];  Service: Urology;  Laterality: Left;  . DILATION AND CURETTAGE OF UTERUS  1980's   w/ Suction for miscarriage  x2    Family History  Problem Relation Age of Onset  . Coronary artery disease Mother   . Arthritis Mother   . Congestive Heart Failure Father   . Heart attack Father   . Other Brother        coronary artery bypass graft age 38  . Hyperlipidemia Brother   . Diabetes Brother   .  Hyperlipidemia Sister   . Diabetes Sister   . Heart disease Sister   . Hyperlipidemia Sister   . Hyperlipidemia Sister   . Heart attack Maternal Aunt        at age 29  . Breast cancer Neg Hx     Medications- reviewed and updated Current Outpatient Medications  Medication Sig Dispense Refill  . Multiple Vitamin (MULTIVITAMIN) capsule Take 1 capsule  by mouth daily.     No current facility-administered medications for this visit.    Allergies-reviewed and updated No Known Allergies  Social History   Socioeconomic History  . Marital status: Married    Spouse name: Not on file  . Number of children: Not on file  . Years of education: Not on file  . Highest education level: Not on file  Occupational History  . Not on file  Tobacco Use  . Smoking status: Former Smoker    Packs/day: 1.00    Years: 5.00    Pack years: 5.00    Types: Cigarettes    Quit date: 04/30/1977    Years since quitting: 43.2  . Smokeless tobacco: Never Used  Substance and Sexual Activity  . Alcohol use: No    Alcohol/week: 0.0 standard drinks  . Drug use: No  . Sexual activity: Not on file  Other Topics Concern  . 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 Difficulty of Paying Living Expenses: Not on file  Food Insecurity:   . Worried About Charity fundraiser in the Last Year: Not on file  . Ran Out of Food in the Last Year: Not on file  Transportation Needs:   . Lack of Transportation (Medical): Not on file  . Lack of Transportation (Non-Medical): Not on file  Physical Activity:   . Days of Exercise per Week: Not on file  . Minutes of Exercise per Session: Not on file  Stress:   . Feeling of Stress : Not on file  Social Connections:   . Frequency of Communication with Friends and Family: Not on file  . Frequency of Social Gatherings with Friends and Family: Not on file  . Attends Religious Services: Not on file  . Active Member of Clubs or Organizations: Not on file  . Attends Archivist Meetings: Not on file  . Marital Status: Not on file        Objective:  Physical Exam: BP 118/81   Pulse 72    Temp 97.7 F (36.5 C) (Temporal)   Ht 5' 3.25" (1.607 m)   Wt 161 lb 6.4 oz (73.2 kg)   SpO2 97%   BMI 28.37 kg/m   Body mass index is 28.37 kg/m. Wt Readings from Last 3 Encounters:  07/20/20 161 lb 6.4 oz (73.2 kg)  04/19/20 162 lb (73.5 kg)  07/07/19 159 lb (72.1 kg)   Gen: NAD, resting comfortably HEENT: TMs normal bilaterally. OP clear. No thyromegaly noted.  CV: RRR with no murmurs appreciated Pulm: NWOB, CTAB with no crackles, wheezes, or rhonchi GI: Normal bowel sounds present. Soft, Nontender, Nondistended. MSK: no edema, cyanosis, or clubbing noted Skin: warm, dry Neuro: CN2-12 grossly intact. Strength 5/5 in upper and lower extremities. Reflexes symmetric  and intact bilaterally.  Psych: Normal affect and thought content     Danique Hartsough M. Jerline Pain, MD 07/20/2020 8:48 AM

## 2020-07-20 NOTE — Addendum Note (Signed)
Addended by: Betti Cruz on: 07/20/2020 08:58 AM   Modules accepted: Orders

## 2020-07-21 LAB — COMPREHENSIVE METABOLIC PANEL
AG Ratio: 1.5 (calc) (ref 1.0–2.5)
ALT: 71 U/L — ABNORMAL HIGH (ref 6–29)
AST: 41 U/L — ABNORMAL HIGH (ref 10–35)
Albumin: 4.3 g/dL (ref 3.6–5.1)
Alkaline phosphatase (APISO): 94 U/L (ref 37–153)
BUN: 23 mg/dL (ref 7–25)
CO2: 26 mmol/L (ref 20–32)
Calcium: 9.9 mg/dL (ref 8.6–10.4)
Chloride: 105 mmol/L (ref 98–110)
Creat: 0.67 mg/dL (ref 0.50–0.99)
Globulin: 2.9 g/dL (calc) (ref 1.9–3.7)
Glucose, Bld: 91 mg/dL (ref 65–99)
Potassium: 4.3 mmol/L (ref 3.5–5.3)
Sodium: 141 mmol/L (ref 135–146)
Total Bilirubin: 0.7 mg/dL (ref 0.2–1.2)
Total Protein: 7.2 g/dL (ref 6.1–8.1)

## 2020-07-21 LAB — LIPID PANEL
Cholesterol: 196 mg/dL (ref <200)
HDL: 79 mg/dL (ref >=50)
LDL Cholesterol (Calc): 104 mg/dL (calc) — ABNORMAL HIGH
Non-HDL Cholesterol (Calc): 117 mg/dL (calc) (ref <130)
Total CHOL/HDL Ratio: 2.5 (calc) (ref <5.0)
Triglycerides: 51 mg/dL (ref <150)

## 2020-07-21 LAB — CBC
HCT: 41.7 % (ref 35.0–45.0)
Hemoglobin: 13.9 g/dL (ref 11.7–15.5)
MCH: 30.6 pg (ref 27.0–33.0)
MCHC: 33.3 g/dL (ref 32.0–36.0)
MCV: 91.9 fL (ref 80.0–100.0)
MPV: 11.9 fL (ref 7.5–12.5)
Platelets: 259 10*3/uL (ref 140–400)
RBC: 4.54 10*6/uL (ref 3.80–5.10)
RDW: 13.1 % (ref 11.0–15.0)
WBC: 6.2 10*3/uL (ref 3.8–10.8)

## 2020-07-21 LAB — TSH: TSH: 1.48 mIU/L (ref 0.40–4.50)

## 2020-07-21 LAB — HEMOGLOBIN A1C
Hgb A1c MFr Bld: 5.4 % of total Hgb (ref <5.7)
Mean Plasma Glucose: 108 (calc)
eAG (mmol/L): 6 (calc)

## 2020-07-21 LAB — VITAMIN D 25 HYDROXY (VIT D DEFICIENCY, FRACTURES): Vit D, 25-Hydroxy: 34 ng/mL (ref 30–100)

## 2020-07-21 NOTE — Progress Notes (Signed)
Please inform patient of the following:  Her liver numbers are a little elevated and cholesterol levels are bordelrine but oeverall her labs are STABLE. Recommend that she come back in 1-2 weeks to recheck liver numbers. Please place future order for CMET.  We can recheck everything else next year.

## 2020-07-24 ENCOUNTER — Other Ambulatory Visit: Payer: Self-pay | Admitting: *Deleted

## 2020-07-24 ENCOUNTER — Telehealth: Payer: Self-pay

## 2020-07-24 DIAGNOSIS — R748 Abnormal levels of other serum enzymes: Secondary | ICD-10-CM

## 2020-07-24 NOTE — Telephone Encounter (Signed)
Lab results given to pt, see results note

## 2020-07-24 NOTE — Telephone Encounter (Signed)
Patient returned call regarding lab results

## 2020-07-26 ENCOUNTER — Other Ambulatory Visit: Payer: Self-pay

## 2020-07-26 ENCOUNTER — Ambulatory Visit (INDEPENDENT_AMBULATORY_CARE_PROVIDER_SITE_OTHER)
Admission: RE | Admit: 2020-07-26 | Discharge: 2020-07-26 | Disposition: A | Discharge status: Home / Self Care | Payer: Medicare PPO | Source: Ambulatory Visit | Attending: Family Medicine | Admitting: Family Medicine

## 2020-07-26 DIAGNOSIS — E2839 Other primary ovarian failure: Secondary | ICD-10-CM | POA: Diagnosis not present

## 2020-07-26 DIAGNOSIS — Z0001 Encounter for general adult medical examination with abnormal findings: Secondary | ICD-10-CM

## 2020-07-31 NOTE — Progress Notes (Signed)
Please inform patient of the following:  She has mild thinning of the bones in her hip but no osteoporsis. Recommend that she continue getting at least 1200mg  of calcium daily and 800IU of vitamin D daily and we can recheck her bone density scan in 2 years.  Algis Greenhouse. Jerline Pain, MD 07/31/2020 8:12 AM

## 2020-08-03 ENCOUNTER — Encounter: Payer: Self-pay | Admitting: Family Medicine

## 2020-08-07 ENCOUNTER — Other Ambulatory Visit: Payer: Medicare PPO

## 2020-08-07 DIAGNOSIS — R748 Abnormal levels of other serum enzymes: Secondary | ICD-10-CM

## 2020-08-08 LAB — COMPREHENSIVE METABOLIC PANEL
AG Ratio: 1.4 (calc) (ref 1.0–2.5)
ALT: 35 U/L — ABNORMAL HIGH (ref 6–29)
AST: 30 U/L (ref 10–35)
Albumin: 4.1 g/dL (ref 3.6–5.1)
Alkaline phosphatase (APISO): 66 U/L (ref 37–153)
BUN: 16 mg/dL (ref 7–25)
CO2: 26 mmol/L (ref 20–32)
Calcium: 9.9 mg/dL (ref 8.6–10.4)
Chloride: 107 mmol/L (ref 98–110)
Creat: 0.54 mg/dL (ref 0.50–0.99)
Globulin: 3 g/dL (calc) (ref 1.9–3.7)
Glucose, Bld: 95 mg/dL (ref 65–99)
Potassium: 4.2 mmol/L (ref 3.5–5.3)
Sodium: 141 mmol/L (ref 135–146)
Total Bilirubin: 0.5 mg/dL (ref 0.2–1.2)
Total Protein: 7.1 g/dL (ref 6.1–8.1)

## 2020-08-08 NOTE — Progress Notes (Signed)
Please inform patient of the following:  Liver numbers much better. Do not need to do any further testing. We can recheck with her next  blood draw.  Tina Aguilar. Jerline Pain, MD 08/08/2020 8:15 AM

## 2020-08-09 ENCOUNTER — Telehealth: Payer: Self-pay

## 2020-08-09 NOTE — Telephone Encounter (Signed)
Caller states, calling from lab results

## 2020-08-09 NOTE — Telephone Encounter (Signed)
Called and lm for pt tcb. 

## 2020-09-18 DIAGNOSIS — M1611 Unilateral primary osteoarthritis, right hip: Secondary | ICD-10-CM | POA: Diagnosis not present

## 2020-09-18 DIAGNOSIS — M25651 Stiffness of right hip, not elsewhere classified: Secondary | ICD-10-CM | POA: Diagnosis not present

## 2020-09-18 DIAGNOSIS — M4807 Spinal stenosis, lumbosacral region: Secondary | ICD-10-CM | POA: Diagnosis not present

## 2020-09-18 DIAGNOSIS — M1612 Unilateral primary osteoarthritis, left hip: Secondary | ICD-10-CM | POA: Diagnosis not present

## 2020-09-18 DIAGNOSIS — M25652 Stiffness of left hip, not elsewhere classified: Secondary | ICD-10-CM | POA: Diagnosis not present

## 2020-10-28 ENCOUNTER — Telehealth: Payer: Self-pay | Admitting: Family Medicine

## 2020-10-28 DIAGNOSIS — E7800 Pure hypercholesterolemia, unspecified: Secondary | ICD-10-CM

## 2020-10-31 NOTE — Telephone Encounter (Signed)
Please advise, no medication in current medication list

## 2020-10-31 NOTE — Telephone Encounter (Signed)
Please verify with patient that she is taking the zetia.  Algis Greenhouse. Jerline Pain, MD 10/31/2020 3:15 PM

## 2020-10-31 NOTE — Telephone Encounter (Signed)
This refill request was sent to Dr. Juleen China instead of Dr. Jerline Pain.  Please advise.

## 2020-11-08 ENCOUNTER — Other Ambulatory Visit: Payer: Self-pay | Admitting: *Deleted

## 2020-11-08 DIAGNOSIS — E7800 Pure hypercholesterolemia, unspecified: Secondary | ICD-10-CM

## 2020-11-08 MED ORDER — EZETIMIBE-SIMVASTATIN 10-10 MG PO TABS: 1.0000 | ORAL_TABLET | Freq: Every day | ORAL | 3 refills | Status: DC

## 2020-11-08 NOTE — Telephone Encounter (Signed)
Patient not taking Zitia.

## 2020-11-08 NOTE — Telephone Encounter (Signed)
Pt stated been taking Vytorin 10-10mg   Rx order

## 2021-01-17 DIAGNOSIS — M25552 Pain in left hip: Secondary | ICD-10-CM | POA: Diagnosis not present

## 2021-01-17 DIAGNOSIS — M9902 Segmental and somatic dysfunction of thoracic region: Secondary | ICD-10-CM | POA: Diagnosis not present

## 2021-01-17 DIAGNOSIS — M9906 Segmental and somatic dysfunction of lower extremity: Secondary | ICD-10-CM | POA: Diagnosis not present

## 2021-01-17 DIAGNOSIS — S338XXA Sprain of other parts of lumbar spine and pelvis, initial encounter: Secondary | ICD-10-CM | POA: Diagnosis not present

## 2021-01-17 DIAGNOSIS — M25551 Pain in right hip: Secondary | ICD-10-CM | POA: Diagnosis not present

## 2021-01-17 DIAGNOSIS — M9905 Segmental and somatic dysfunction of pelvic region: Secondary | ICD-10-CM | POA: Diagnosis not present

## 2021-01-17 DIAGNOSIS — S39012A Strain of muscle, fascia and tendon of lower back, initial encounter: Secondary | ICD-10-CM | POA: Diagnosis not present

## 2021-01-17 DIAGNOSIS — M9903 Segmental and somatic dysfunction of lumbar region: Secondary | ICD-10-CM | POA: Diagnosis not present

## 2021-01-17 DIAGNOSIS — S29012A Strain of muscle and tendon of back wall of thorax, initial encounter: Secondary | ICD-10-CM | POA: Diagnosis not present

## 2021-01-19 DIAGNOSIS — M25551 Pain in right hip: Secondary | ICD-10-CM | POA: Diagnosis not present

## 2021-01-19 DIAGNOSIS — M25552 Pain in left hip: Secondary | ICD-10-CM | POA: Diagnosis not present

## 2021-01-19 DIAGNOSIS — M9905 Segmental and somatic dysfunction of pelvic region: Secondary | ICD-10-CM | POA: Diagnosis not present

## 2021-01-19 DIAGNOSIS — S338XXA Sprain of other parts of lumbar spine and pelvis, initial encounter: Secondary | ICD-10-CM | POA: Diagnosis not present

## 2021-01-19 DIAGNOSIS — S39012A Strain of muscle, fascia and tendon of lower back, initial encounter: Secondary | ICD-10-CM | POA: Diagnosis not present

## 2021-01-19 DIAGNOSIS — S29012A Strain of muscle and tendon of back wall of thorax, initial encounter: Secondary | ICD-10-CM | POA: Diagnosis not present

## 2021-01-19 DIAGNOSIS — M9903 Segmental and somatic dysfunction of lumbar region: Secondary | ICD-10-CM | POA: Diagnosis not present

## 2021-01-19 DIAGNOSIS — M9906 Segmental and somatic dysfunction of lower extremity: Secondary | ICD-10-CM | POA: Diagnosis not present

## 2021-01-19 DIAGNOSIS — M9902 Segmental and somatic dysfunction of thoracic region: Secondary | ICD-10-CM | POA: Diagnosis not present

## 2021-01-24 DIAGNOSIS — M9902 Segmental and somatic dysfunction of thoracic region: Secondary | ICD-10-CM | POA: Diagnosis not present

## 2021-01-24 DIAGNOSIS — M25552 Pain in left hip: Secondary | ICD-10-CM | POA: Diagnosis not present

## 2021-01-24 DIAGNOSIS — S29012A Strain of muscle and tendon of back wall of thorax, initial encounter: Secondary | ICD-10-CM | POA: Diagnosis not present

## 2021-01-24 DIAGNOSIS — S338XXA Sprain of other parts of lumbar spine and pelvis, initial encounter: Secondary | ICD-10-CM | POA: Diagnosis not present

## 2021-01-24 DIAGNOSIS — M25551 Pain in right hip: Secondary | ICD-10-CM | POA: Diagnosis not present

## 2021-01-24 DIAGNOSIS — M9906 Segmental and somatic dysfunction of lower extremity: Secondary | ICD-10-CM | POA: Diagnosis not present

## 2021-01-24 DIAGNOSIS — M9903 Segmental and somatic dysfunction of lumbar region: Secondary | ICD-10-CM | POA: Diagnosis not present

## 2021-01-24 DIAGNOSIS — M9905 Segmental and somatic dysfunction of pelvic region: Secondary | ICD-10-CM | POA: Diagnosis not present

## 2021-01-24 DIAGNOSIS — S39012A Strain of muscle, fascia and tendon of lower back, initial encounter: Secondary | ICD-10-CM | POA: Diagnosis not present

## 2021-01-26 DIAGNOSIS — M9903 Segmental and somatic dysfunction of lumbar region: Secondary | ICD-10-CM | POA: Diagnosis not present

## 2021-01-26 DIAGNOSIS — M9906 Segmental and somatic dysfunction of lower extremity: Secondary | ICD-10-CM | POA: Diagnosis not present

## 2021-01-26 DIAGNOSIS — M9905 Segmental and somatic dysfunction of pelvic region: Secondary | ICD-10-CM | POA: Diagnosis not present

## 2021-01-26 DIAGNOSIS — S338XXA Sprain of other parts of lumbar spine and pelvis, initial encounter: Secondary | ICD-10-CM | POA: Diagnosis not present

## 2021-01-26 DIAGNOSIS — M25552 Pain in left hip: Secondary | ICD-10-CM | POA: Diagnosis not present

## 2021-01-26 DIAGNOSIS — M25551 Pain in right hip: Secondary | ICD-10-CM | POA: Diagnosis not present

## 2021-01-26 DIAGNOSIS — S29012A Strain of muscle and tendon of back wall of thorax, initial encounter: Secondary | ICD-10-CM | POA: Diagnosis not present

## 2021-01-26 DIAGNOSIS — M9902 Segmental and somatic dysfunction of thoracic region: Secondary | ICD-10-CM | POA: Diagnosis not present

## 2021-01-26 DIAGNOSIS — S39012A Strain of muscle, fascia and tendon of lower back, initial encounter: Secondary | ICD-10-CM | POA: Diagnosis not present

## 2021-02-02 DIAGNOSIS — M25551 Pain in right hip: Secondary | ICD-10-CM | POA: Diagnosis not present

## 2021-02-02 DIAGNOSIS — M9906 Segmental and somatic dysfunction of lower extremity: Secondary | ICD-10-CM | POA: Diagnosis not present

## 2021-02-02 DIAGNOSIS — M9903 Segmental and somatic dysfunction of lumbar region: Secondary | ICD-10-CM | POA: Diagnosis not present

## 2021-02-02 DIAGNOSIS — S29012A Strain of muscle and tendon of back wall of thorax, initial encounter: Secondary | ICD-10-CM | POA: Diagnosis not present

## 2021-02-02 DIAGNOSIS — S338XXA Sprain of other parts of lumbar spine and pelvis, initial encounter: Secondary | ICD-10-CM | POA: Diagnosis not present

## 2021-02-02 DIAGNOSIS — S39012A Strain of muscle, fascia and tendon of lower back, initial encounter: Secondary | ICD-10-CM | POA: Diagnosis not present

## 2021-02-02 DIAGNOSIS — M25552 Pain in left hip: Secondary | ICD-10-CM | POA: Diagnosis not present

## 2021-02-02 DIAGNOSIS — M9905 Segmental and somatic dysfunction of pelvic region: Secondary | ICD-10-CM | POA: Diagnosis not present

## 2021-02-02 DIAGNOSIS — M9902 Segmental and somatic dysfunction of thoracic region: Secondary | ICD-10-CM | POA: Diagnosis not present

## 2021-02-09 DIAGNOSIS — M9903 Segmental and somatic dysfunction of lumbar region: Secondary | ICD-10-CM | POA: Diagnosis not present

## 2021-02-09 DIAGNOSIS — S39012A Strain of muscle, fascia and tendon of lower back, initial encounter: Secondary | ICD-10-CM | POA: Diagnosis not present

## 2021-02-09 DIAGNOSIS — M9906 Segmental and somatic dysfunction of lower extremity: Secondary | ICD-10-CM | POA: Diagnosis not present

## 2021-02-09 DIAGNOSIS — S29012A Strain of muscle and tendon of back wall of thorax, initial encounter: Secondary | ICD-10-CM | POA: Diagnosis not present

## 2021-02-09 DIAGNOSIS — M9902 Segmental and somatic dysfunction of thoracic region: Secondary | ICD-10-CM | POA: Diagnosis not present

## 2021-02-09 DIAGNOSIS — M25552 Pain in left hip: Secondary | ICD-10-CM | POA: Diagnosis not present

## 2021-02-09 DIAGNOSIS — M25551 Pain in right hip: Secondary | ICD-10-CM | POA: Diagnosis not present

## 2021-02-09 DIAGNOSIS — S338XXA Sprain of other parts of lumbar spine and pelvis, initial encounter: Secondary | ICD-10-CM | POA: Diagnosis not present

## 2021-02-09 DIAGNOSIS — M9905 Segmental and somatic dysfunction of pelvic region: Secondary | ICD-10-CM | POA: Diagnosis not present

## 2021-02-16 DIAGNOSIS — S338XXA Sprain of other parts of lumbar spine and pelvis, initial encounter: Secondary | ICD-10-CM | POA: Diagnosis not present

## 2021-02-16 DIAGNOSIS — M25551 Pain in right hip: Secondary | ICD-10-CM | POA: Diagnosis not present

## 2021-02-16 DIAGNOSIS — M9905 Segmental and somatic dysfunction of pelvic region: Secondary | ICD-10-CM | POA: Diagnosis not present

## 2021-02-16 DIAGNOSIS — M9903 Segmental and somatic dysfunction of lumbar region: Secondary | ICD-10-CM | POA: Diagnosis not present

## 2021-02-16 DIAGNOSIS — S39012A Strain of muscle, fascia and tendon of lower back, initial encounter: Secondary | ICD-10-CM | POA: Diagnosis not present

## 2021-02-16 DIAGNOSIS — S29012A Strain of muscle and tendon of back wall of thorax, initial encounter: Secondary | ICD-10-CM | POA: Diagnosis not present

## 2021-02-16 DIAGNOSIS — M9902 Segmental and somatic dysfunction of thoracic region: Secondary | ICD-10-CM | POA: Diagnosis not present

## 2021-02-16 DIAGNOSIS — M25552 Pain in left hip: Secondary | ICD-10-CM | POA: Diagnosis not present

## 2021-02-16 DIAGNOSIS — M9906 Segmental and somatic dysfunction of lower extremity: Secondary | ICD-10-CM | POA: Diagnosis not present

## 2021-02-23 DIAGNOSIS — M9903 Segmental and somatic dysfunction of lumbar region: Secondary | ICD-10-CM | POA: Diagnosis not present

## 2021-02-23 DIAGNOSIS — S39012A Strain of muscle, fascia and tendon of lower back, initial encounter: Secondary | ICD-10-CM | POA: Diagnosis not present

## 2021-02-23 DIAGNOSIS — M9905 Segmental and somatic dysfunction of pelvic region: Secondary | ICD-10-CM | POA: Diagnosis not present

## 2021-02-23 DIAGNOSIS — M9902 Segmental and somatic dysfunction of thoracic region: Secondary | ICD-10-CM | POA: Diagnosis not present

## 2021-02-23 DIAGNOSIS — M25552 Pain in left hip: Secondary | ICD-10-CM | POA: Diagnosis not present

## 2021-02-23 DIAGNOSIS — S29012A Strain of muscle and tendon of back wall of thorax, initial encounter: Secondary | ICD-10-CM | POA: Diagnosis not present

## 2021-02-23 DIAGNOSIS — M9906 Segmental and somatic dysfunction of lower extremity: Secondary | ICD-10-CM | POA: Diagnosis not present

## 2021-02-23 DIAGNOSIS — M25551 Pain in right hip: Secondary | ICD-10-CM | POA: Diagnosis not present

## 2021-02-23 DIAGNOSIS — S338XXA Sprain of other parts of lumbar spine and pelvis, initial encounter: Secondary | ICD-10-CM | POA: Diagnosis not present

## 2021-03-13 ENCOUNTER — Telehealth (INDEPENDENT_AMBULATORY_CARE_PROVIDER_SITE_OTHER): Payer: Medicare PPO | Admitting: Family Medicine

## 2021-03-13 DIAGNOSIS — R0981 Nasal congestion: Secondary | ICD-10-CM

## 2021-03-13 DIAGNOSIS — J029 Acute pharyngitis, unspecified: Secondary | ICD-10-CM

## 2021-03-13 NOTE — Patient Instructions (Signed)
  HOME CARE TIPS:  -[place] COVID19 testing information: [email or web address] OR [phone] Most pharmacies also offer testing and home test kits. If the Covid19 test is positive, please make a prompt follow up visit with your primary care office or with [facility] to discuss treatment options. Treatments for Covid19 are best given early in the course of the illness.   -can use tylenol or aleve if needed for fevers, aches and pains per instructions  -can use nasal saline a few times per day if you have nasal congestion; sometimes  a short course of Afrin nasal spray for 3 days can help with symptoms as well  -stay hydrated, drink plenty of fluids and eat small healthy meals - avoid dairy  -can take 1000 IU (25mcg) Vit D3 and 100-500 mg of Vit C daily per instructions  -If the Covid test is positive, check out the CDC website for more information on home care, transmission and treatment for COVID19  -follow up with your doctor in 2-3 days unless improving and feeling better  -stay home while sick, except to seek medical care. If you have COVID19, ideally it would be best to stay home for a full 10 days since the onset of symptoms PLUS one day of no fever and feeling better. Wear a good mask that fits snugly (such as N95 or KN95) if around others to reduce the risk of transmission.  It was nice to meet you today, and I really hope you are feeling better soon. I help Tina Aguilar out with telemedicine visits on Tuesdays and Thursdays and am available for visits on those days. If you have any concerns or questions following this visit please schedule a follow up visit with your Primary Care doctor or seek care at a local urgent care clinic to avoid delays in care.    Seek in person care or schedule a follow up video visit promptly if your symptoms worsen, new concerns arise or you are not improving with treatment. Call 911 and/or seek emergency care if your  symptoms are severe or life threatening.   

## 2021-03-13 NOTE — Progress Notes (Signed)
Virtual Visit via Video Note  I connected with Tina Aguilar  on 03/13/21 at 10:20 AM EDT by a video enabled telemedicine application and verified that I am speaking with the correct person using two identifiers.  Location patient: home, [place] Location provider:work or home office Persons participating in the virtual visit: patient, provider  I discussed the limitations of evaluation and management by telemedicine and the availability of in person appointments. The patient expressed understanding and agreed to proceed.   HPI:  Acute telemedicine visit for : -Onset: about 6 days ago; did a home covid test on day 2 -Symptoms include: sore throat, slight headache, nasal congestion, PND, sneezing, laryngitis -now doing better but still a lot of drainage and a sore throat -Denies: fever, body aches, NVD, CP, SOB, inability to eat/drink/get out of bed -a lot of folks at church have been sick -Has tried: nothing -Pertinent past medical history: see below -Pertinent medication allergies: No Known Allergies -COVID-19 vaccine status: fully vaccinated and boosted; has had pneumonia vaccinated  ROS: See pertinent positives and negatives per HPI.  Past Medical History:  Diagnosis Date  . Arthritis    hips, hands  . Frequency of urination   . History of adenomatous polyp of colon 08/28/2005  . Hyperlipidemia, mixed   . Left ureteral stone   . Spinal stenosis   . Vertigo   . Wears glasses     Past Surgical History:  Procedure Laterality Date  . CESAREAN SECTION  1991;  1993  . COLONOSCOPY  last one 11-23-2010  . CYSTOSCOPY/URETEROSCOPY/HOLMIUM LASER/STENT PLACEMENT Left 05/06/2017   Procedure: CYSTOSCOPY/URETEROSCOPY/HOLMIUM LASER/STENT PLACEMENT;  Surgeon: Nickie Retort, MD;  Location: [facility];  Service: Urology;  Laterality: Left;  . DILATION AND CURETTAGE OF UTERUS  1980's   w/ Suction for miscarriage  x2     Current Outpatient Medications:  .   ezetimibe-simvastatin (VYTORIN) 10-10 MG tablet, Take 1 tablet by mouth at bedtime., Disp: 90 tablet, Rfl: 3 .  Multiple Vitamin (MULTIVITAMIN) capsule, Take 1 capsule by mouth daily., Disp: , Rfl:   EXAM:  VITALS per patient if applicable:  GENERAL: alert, oriented, appears well and in no acute distress  HEENT: atraumatic, conjunttiva clear, no obvious abnormalities on inspection of external nose and ears  NECK: normal movements of the head and neck  LUNGS: on inspection no signs of respiratory distress, breathing rate appears normal, no obvious gross SOB, gasping or wheezing  CV: no obvious cyanosis  MS: moves all visible extremities without noticeable abnormality  PSYCH/NEURO: pleasant and cooperative, no obvious depression or anxiety, speech and thought processing grossly intact  ASSESSMENT AND PLAN:  Discussed the following assessment and plan:  Nasal congestion  Sore throat  -we discussed possible serious and likely etiologies, options for evaluation and workup, limitations of telemedicine visit vs in person visit, treatment, treatment risks and precautions. Pt prefers to treat via telemedicine empirically rather than in person at this moment.  Query viral upper respiratory illness, possible COVID with false negative testing giving high levels of COVID in the community, versus other.  Opted for short course nasal decongestant, nasal saline and symptomatic care measures provided in patient instructions. Work/School slipped offered: declined Advised to seek prompt follow up or in person care if worsening, new symptoms arise, or if is not improving with treatment.    I discussed the assessment and treatment plan with the patient. The patient was provided an opportunity to ask questions and all were answered. The patient agreed with  the plan and demonstrated an understanding of the instructions.     Lucretia Kern, DO

## 2021-03-20 DIAGNOSIS — M9906 Segmental and somatic dysfunction of lower extremity: Secondary | ICD-10-CM | POA: Diagnosis not present

## 2021-03-20 DIAGNOSIS — S29012A Strain of muscle and tendon of back wall of thorax, initial encounter: Secondary | ICD-10-CM | POA: Diagnosis not present

## 2021-03-20 DIAGNOSIS — M25552 Pain in left hip: Secondary | ICD-10-CM | POA: Diagnosis not present

## 2021-03-20 DIAGNOSIS — M9903 Segmental and somatic dysfunction of lumbar region: Secondary | ICD-10-CM | POA: Diagnosis not present

## 2021-03-20 DIAGNOSIS — M9905 Segmental and somatic dysfunction of pelvic region: Secondary | ICD-10-CM | POA: Diagnosis not present

## 2021-03-20 DIAGNOSIS — M9902 Segmental and somatic dysfunction of thoracic region: Secondary | ICD-10-CM | POA: Diagnosis not present

## 2021-03-20 DIAGNOSIS — M25551 Pain in right hip: Secondary | ICD-10-CM | POA: Diagnosis not present

## 2021-03-20 DIAGNOSIS — S338XXA Sprain of other parts of lumbar spine and pelvis, initial encounter: Secondary | ICD-10-CM | POA: Diagnosis not present

## 2021-03-20 DIAGNOSIS — S39012A Strain of muscle, fascia and tendon of lower back, initial encounter: Secondary | ICD-10-CM | POA: Diagnosis not present

## 2021-03-23 DIAGNOSIS — M9906 Segmental and somatic dysfunction of lower extremity: Secondary | ICD-10-CM | POA: Diagnosis not present

## 2021-03-23 DIAGNOSIS — S29012A Strain of muscle and tendon of back wall of thorax, initial encounter: Secondary | ICD-10-CM | POA: Diagnosis not present

## 2021-03-23 DIAGNOSIS — M25551 Pain in right hip: Secondary | ICD-10-CM | POA: Diagnosis not present

## 2021-03-23 DIAGNOSIS — S338XXA Sprain of other parts of lumbar spine and pelvis, initial encounter: Secondary | ICD-10-CM | POA: Diagnosis not present

## 2021-03-23 DIAGNOSIS — M9903 Segmental and somatic dysfunction of lumbar region: Secondary | ICD-10-CM | POA: Diagnosis not present

## 2021-03-23 DIAGNOSIS — M25552 Pain in left hip: Secondary | ICD-10-CM | POA: Diagnosis not present

## 2021-03-23 DIAGNOSIS — S39012A Strain of muscle, fascia and tendon of lower back, initial encounter: Secondary | ICD-10-CM | POA: Diagnosis not present

## 2021-03-23 DIAGNOSIS — M9905 Segmental and somatic dysfunction of pelvic region: Secondary | ICD-10-CM | POA: Diagnosis not present

## 2021-03-23 DIAGNOSIS — M9902 Segmental and somatic dysfunction of thoracic region: Secondary | ICD-10-CM | POA: Diagnosis not present

## 2021-03-26 ENCOUNTER — Encounter: Payer: Self-pay | Admitting: Internal Medicine

## 2021-03-30 DIAGNOSIS — M25551 Pain in right hip: Secondary | ICD-10-CM | POA: Diagnosis not present

## 2021-03-30 DIAGNOSIS — M25552 Pain in left hip: Secondary | ICD-10-CM | POA: Diagnosis not present

## 2021-03-30 DIAGNOSIS — S39012A Strain of muscle, fascia and tendon of lower back, initial encounter: Secondary | ICD-10-CM | POA: Diagnosis not present

## 2021-03-30 DIAGNOSIS — S338XXA Sprain of other parts of lumbar spine and pelvis, initial encounter: Secondary | ICD-10-CM | POA: Diagnosis not present

## 2021-03-30 DIAGNOSIS — M9906 Segmental and somatic dysfunction of lower extremity: Secondary | ICD-10-CM | POA: Diagnosis not present

## 2021-03-30 DIAGNOSIS — M9903 Segmental and somatic dysfunction of lumbar region: Secondary | ICD-10-CM | POA: Diagnosis not present

## 2021-03-30 DIAGNOSIS — S29012A Strain of muscle and tendon of back wall of thorax, initial encounter: Secondary | ICD-10-CM | POA: Diagnosis not present

## 2021-03-30 DIAGNOSIS — M9905 Segmental and somatic dysfunction of pelvic region: Secondary | ICD-10-CM | POA: Diagnosis not present

## 2021-03-30 DIAGNOSIS — M9902 Segmental and somatic dysfunction of thoracic region: Secondary | ICD-10-CM | POA: Diagnosis not present

## 2021-04-06 DIAGNOSIS — S39012A Strain of muscle, fascia and tendon of lower back, initial encounter: Secondary | ICD-10-CM | POA: Diagnosis not present

## 2021-04-06 DIAGNOSIS — M25552 Pain in left hip: Secondary | ICD-10-CM | POA: Diagnosis not present

## 2021-04-06 DIAGNOSIS — S29012A Strain of muscle and tendon of back wall of thorax, initial encounter: Secondary | ICD-10-CM | POA: Diagnosis not present

## 2021-04-06 DIAGNOSIS — S338XXA Sprain of other parts of lumbar spine and pelvis, initial encounter: Secondary | ICD-10-CM | POA: Diagnosis not present

## 2021-04-06 DIAGNOSIS — M9905 Segmental and somatic dysfunction of pelvic region: Secondary | ICD-10-CM | POA: Diagnosis not present

## 2021-04-06 DIAGNOSIS — M9906 Segmental and somatic dysfunction of lower extremity: Secondary | ICD-10-CM | POA: Diagnosis not present

## 2021-04-06 DIAGNOSIS — M25551 Pain in right hip: Secondary | ICD-10-CM | POA: Diagnosis not present

## 2021-04-06 DIAGNOSIS — M9903 Segmental and somatic dysfunction of lumbar region: Secondary | ICD-10-CM | POA: Diagnosis not present

## 2021-04-06 DIAGNOSIS — M9902 Segmental and somatic dysfunction of thoracic region: Secondary | ICD-10-CM | POA: Diagnosis not present

## 2021-04-12 DIAGNOSIS — S29012A Strain of muscle and tendon of back wall of thorax, initial encounter: Secondary | ICD-10-CM | POA: Diagnosis not present

## 2021-04-12 DIAGNOSIS — M9906 Segmental and somatic dysfunction of lower extremity: Secondary | ICD-10-CM | POA: Diagnosis not present

## 2021-04-12 DIAGNOSIS — M9903 Segmental and somatic dysfunction of lumbar region: Secondary | ICD-10-CM | POA: Diagnosis not present

## 2021-04-12 DIAGNOSIS — M9902 Segmental and somatic dysfunction of thoracic region: Secondary | ICD-10-CM | POA: Diagnosis not present

## 2021-04-12 DIAGNOSIS — M25552 Pain in left hip: Secondary | ICD-10-CM | POA: Diagnosis not present

## 2021-04-12 DIAGNOSIS — M9905 Segmental and somatic dysfunction of pelvic region: Secondary | ICD-10-CM | POA: Diagnosis not present

## 2021-04-12 DIAGNOSIS — S39012A Strain of muscle, fascia and tendon of lower back, initial encounter: Secondary | ICD-10-CM | POA: Diagnosis not present

## 2021-04-12 DIAGNOSIS — S338XXA Sprain of other parts of lumbar spine and pelvis, initial encounter: Secondary | ICD-10-CM | POA: Diagnosis not present

## 2021-04-12 DIAGNOSIS — M25551 Pain in right hip: Secondary | ICD-10-CM | POA: Diagnosis not present

## 2021-04-19 DIAGNOSIS — M9906 Segmental and somatic dysfunction of lower extremity: Secondary | ICD-10-CM | POA: Diagnosis not present

## 2021-04-19 DIAGNOSIS — S29012A Strain of muscle and tendon of back wall of thorax, initial encounter: Secondary | ICD-10-CM | POA: Diagnosis not present

## 2021-04-19 DIAGNOSIS — M9903 Segmental and somatic dysfunction of lumbar region: Secondary | ICD-10-CM | POA: Diagnosis not present

## 2021-04-19 DIAGNOSIS — M25551 Pain in right hip: Secondary | ICD-10-CM | POA: Diagnosis not present

## 2021-04-19 DIAGNOSIS — S39012A Strain of muscle, fascia and tendon of lower back, initial encounter: Secondary | ICD-10-CM | POA: Diagnosis not present

## 2021-04-19 DIAGNOSIS — S338XXA Sprain of other parts of lumbar spine and pelvis, initial encounter: Secondary | ICD-10-CM | POA: Diagnosis not present

## 2021-04-19 DIAGNOSIS — M9905 Segmental and somatic dysfunction of pelvic region: Secondary | ICD-10-CM | POA: Diagnosis not present

## 2021-04-19 DIAGNOSIS — M9902 Segmental and somatic dysfunction of thoracic region: Secondary | ICD-10-CM | POA: Diagnosis not present

## 2021-04-19 DIAGNOSIS — M25552 Pain in left hip: Secondary | ICD-10-CM | POA: Diagnosis not present

## 2021-04-27 ENCOUNTER — Other Ambulatory Visit: Payer: Self-pay | Admitting: Family Medicine

## 2021-04-27 DIAGNOSIS — Z1231 Encounter for screening mammogram for malignant neoplasm of breast: Secondary | ICD-10-CM

## 2021-05-07 DIAGNOSIS — M9906 Segmental and somatic dysfunction of lower extremity: Secondary | ICD-10-CM | POA: Diagnosis not present

## 2021-05-07 DIAGNOSIS — M25551 Pain in right hip: Secondary | ICD-10-CM | POA: Diagnosis not present

## 2021-05-07 DIAGNOSIS — M9903 Segmental and somatic dysfunction of lumbar region: Secondary | ICD-10-CM | POA: Diagnosis not present

## 2021-05-07 DIAGNOSIS — M9905 Segmental and somatic dysfunction of pelvic region: Secondary | ICD-10-CM | POA: Diagnosis not present

## 2021-05-07 DIAGNOSIS — S29012A Strain of muscle and tendon of back wall of thorax, initial encounter: Secondary | ICD-10-CM | POA: Diagnosis not present

## 2021-05-07 DIAGNOSIS — S39012A Strain of muscle, fascia and tendon of lower back, initial encounter: Secondary | ICD-10-CM | POA: Diagnosis not present

## 2021-05-07 DIAGNOSIS — S338XXA Sprain of other parts of lumbar spine and pelvis, initial encounter: Secondary | ICD-10-CM | POA: Diagnosis not present

## 2021-05-07 DIAGNOSIS — M9902 Segmental and somatic dysfunction of thoracic region: Secondary | ICD-10-CM | POA: Diagnosis not present

## 2021-05-07 DIAGNOSIS — M25552 Pain in left hip: Secondary | ICD-10-CM | POA: Diagnosis not present

## 2021-05-09 ENCOUNTER — Telehealth: Payer: Self-pay | Admitting: Family Medicine

## 2021-05-09 NOTE — Telephone Encounter (Signed)
error 

## 2021-05-09 NOTE — Telephone Encounter (Signed)
Copied from [place] [id number]. Topic: Medicare AWV >> May 09, 2021  9:17 AM Harris-Coley, Hannah Beat wrote: Reason for CRM: Left message for patient to schedule Annual Wellness Visit.  Please schedule with Nurse Health Advisor Charlott Rakes, RN at [facility].

## 2021-05-21 DIAGNOSIS — M9902 Segmental and somatic dysfunction of thoracic region: Secondary | ICD-10-CM | POA: Diagnosis not present

## 2021-05-21 DIAGNOSIS — M9906 Segmental and somatic dysfunction of lower extremity: Secondary | ICD-10-CM | POA: Diagnosis not present

## 2021-05-21 DIAGNOSIS — M25551 Pain in right hip: Secondary | ICD-10-CM | POA: Diagnosis not present

## 2021-05-21 DIAGNOSIS — M25552 Pain in left hip: Secondary | ICD-10-CM | POA: Diagnosis not present

## 2021-05-21 DIAGNOSIS — S29012A Strain of muscle and tendon of back wall of thorax, initial encounter: Secondary | ICD-10-CM | POA: Diagnosis not present

## 2021-05-21 DIAGNOSIS — S338XXA Sprain of other parts of lumbar spine and pelvis, initial encounter: Secondary | ICD-10-CM | POA: Diagnosis not present

## 2021-05-21 DIAGNOSIS — M9903 Segmental and somatic dysfunction of lumbar region: Secondary | ICD-10-CM | POA: Diagnosis not present

## 2021-05-21 DIAGNOSIS — S39012A Strain of muscle, fascia and tendon of lower back, initial encounter: Secondary | ICD-10-CM | POA: Diagnosis not present

## 2021-05-21 DIAGNOSIS — M9905 Segmental and somatic dysfunction of pelvic region: Secondary | ICD-10-CM | POA: Diagnosis not present

## 2021-06-08 DIAGNOSIS — M9906 Segmental and somatic dysfunction of lower extremity: Secondary | ICD-10-CM | POA: Diagnosis not present

## 2021-06-08 DIAGNOSIS — M9903 Segmental and somatic dysfunction of lumbar region: Secondary | ICD-10-CM | POA: Diagnosis not present

## 2021-06-08 DIAGNOSIS — M25551 Pain in right hip: Secondary | ICD-10-CM | POA: Diagnosis not present

## 2021-06-08 DIAGNOSIS — M25552 Pain in left hip: Secondary | ICD-10-CM | POA: Diagnosis not present

## 2021-06-08 DIAGNOSIS — M9902 Segmental and somatic dysfunction of thoracic region: Secondary | ICD-10-CM | POA: Diagnosis not present

## 2021-06-08 DIAGNOSIS — M9905 Segmental and somatic dysfunction of pelvic region: Secondary | ICD-10-CM | POA: Diagnosis not present

## 2021-06-08 DIAGNOSIS — S338XXA Sprain of other parts of lumbar spine and pelvis, initial encounter: Secondary | ICD-10-CM | POA: Diagnosis not present

## 2021-06-08 DIAGNOSIS — S39012A Strain of muscle, fascia and tendon of lower back, initial encounter: Secondary | ICD-10-CM | POA: Diagnosis not present

## 2021-06-08 DIAGNOSIS — S29012A Strain of muscle and tendon of back wall of thorax, initial encounter: Secondary | ICD-10-CM | POA: Diagnosis not present

## 2021-06-13 ENCOUNTER — Other Ambulatory Visit: Payer: Self-pay

## 2021-06-13 ENCOUNTER — Ambulatory Visit (AMBULATORY_SURGERY_CENTER): Payer: Self-pay

## 2021-06-13 VITALS — Ht 63.0 in | Wt 166.0 lb

## 2021-06-13 DIAGNOSIS — Z86010 Personal history of colonic polyps: Secondary | ICD-10-CM

## 2021-06-13 NOTE — Progress Notes (Signed)
No egg or soy allergy known to patient  No issues with past sedation with any surgeries or procedures Patient denies ever being told they had issues or difficulty with intubation  No FH of Malignant Hyperthermia No diet pills per patient No home 02 use per patient  No blood thinners per patient  Pt denies issues with constipation at this time; No A fib or A flutter  EMMI video via MyChart  COVID 19 guidelines implemented in PV today with Pt and RN   Pt is fully vaccinated for Covid x 2 + booster;  NO PA's for preps discussed with pt in PV today  Discussed with pt there will be an out-of-pocket cost for prep and that varies from $0 to 70 +  dollars   Due to the COVID-19 pandemic we are asking patients to follow certain guidelines.  Pt aware of COVID protocols and [facility] guidelines

## 2021-06-21 ENCOUNTER — Ambulatory Visit
Admission: RE | Admit: 2021-06-21 | Discharge: 2021-06-21 | Disposition: A | Discharge status: Home / Self Care | Payer: Medicare PPO | Source: Ambulatory Visit | Attending: Family Medicine | Admitting: Family Medicine

## 2021-06-21 ENCOUNTER — Other Ambulatory Visit: Payer: Self-pay

## 2021-06-21 DIAGNOSIS — Z1231 Encounter for screening mammogram for malignant neoplasm of breast: Secondary | ICD-10-CM

## 2021-06-22 ENCOUNTER — Telehealth: Payer: Self-pay | Admitting: Internal Medicine

## 2021-06-22 DIAGNOSIS — Z86010 Personal history of colonic polyps: Secondary | ICD-10-CM

## 2021-06-22 MED ORDER — NA SULFATE-K SULFATE-MG SULF 17.5-3.13-1.6 GM/177ML PO SOLN: 1.0000 | ORAL | 0 refills | Status: DC

## 2021-06-22 NOTE — Telephone Encounter (Signed)
Suprep Rx sent to pt's pharmacy.

## 2021-06-22 NOTE — Telephone Encounter (Signed)
Patient called said she has not be able to pick up her prep not sure which prep she is to have please send to her pharmacy her procedure is scheduled for 06/27/21.

## 2021-06-25 DIAGNOSIS — S338XXA Sprain of other parts of lumbar spine and pelvis, initial encounter: Secondary | ICD-10-CM | POA: Diagnosis not present

## 2021-06-25 DIAGNOSIS — M9903 Segmental and somatic dysfunction of lumbar region: Secondary | ICD-10-CM | POA: Diagnosis not present

## 2021-06-25 DIAGNOSIS — M9905 Segmental and somatic dysfunction of pelvic region: Secondary | ICD-10-CM | POA: Diagnosis not present

## 2021-06-25 DIAGNOSIS — S29012A Strain of muscle and tendon of back wall of thorax, initial encounter: Secondary | ICD-10-CM | POA: Diagnosis not present

## 2021-06-25 DIAGNOSIS — M9902 Segmental and somatic dysfunction of thoracic region: Secondary | ICD-10-CM | POA: Diagnosis not present

## 2021-06-25 DIAGNOSIS — M25551 Pain in right hip: Secondary | ICD-10-CM | POA: Diagnosis not present

## 2021-06-25 DIAGNOSIS — M25552 Pain in left hip: Secondary | ICD-10-CM | POA: Diagnosis not present

## 2021-06-25 DIAGNOSIS — S39012A Strain of muscle, fascia and tendon of lower back, initial encounter: Secondary | ICD-10-CM | POA: Diagnosis not present

## 2021-06-25 DIAGNOSIS — M9906 Segmental and somatic dysfunction of lower extremity: Secondary | ICD-10-CM | POA: Diagnosis not present

## 2021-06-27 ENCOUNTER — Ambulatory Visit (AMBULATORY_SURGERY_CENTER): Payer: Medicare PPO | Admitting: Internal Medicine

## 2021-06-27 ENCOUNTER — Encounter: Payer: Self-pay | Admitting: Internal Medicine

## 2021-06-27 ENCOUNTER — Other Ambulatory Visit: Payer: Self-pay

## 2021-06-27 VITALS — BP 129/75 | HR 67 | Temp 97.3°F | Resp 14 | Ht 63.0 in | Wt 166.0 lb

## 2021-06-27 DIAGNOSIS — K5730 Diverticulosis of large intestine without perforation or abscess without bleeding: Secondary | ICD-10-CM | POA: Diagnosis not present

## 2021-06-27 DIAGNOSIS — Z1211 Encounter for screening for malignant neoplasm of colon: Secondary | ICD-10-CM

## 2021-06-27 MED ORDER — SODIUM CHLORIDE 0.9 % IV SOLN: 500.0000 mL | INTRAVENOUS | Status: DC

## 2021-06-27 NOTE — Progress Notes (Signed)
Sedate, gd SR, tolerated procedure well, VSS, report to RN 

## 2021-06-27 NOTE — Progress Notes (Signed)
HISTORY OF PRESENT ILLNESS:  Tina Aguilar is a 65 y.o. female who presents today for routine screening colonoscopy.  Last examination 2012 was normal.  No active complaints or active medical problems  REVIEW OF SYSTEMS:  All non-GI ROS negative.   Past Medical History:  Diagnosis Date   Arthritis    hips, hands   Chronic kidney disease    hx of kidney stones   Frequency of urination    History of adenomatous polyp of colon 08/28/2005   Hyperlipidemia, mixed    on meds   Left ureteral stone    Osteoarthritis    bilateral hips/lower back   Spinal stenosis    Vertigo    Wears glasses     Past Surgical History:  Procedure Laterality Date   [place]   and 1993   COLONOSCOPY  2012   JP-F/V-movi(exc)-normal   CYSTOSCOPY/URETEROSCOPY/HOLMIUM LASER/STENT PLACEMENT Left 05/06/2017   Procedure: CYSTOSCOPY/URETEROSCOPY/HOLMIUM LASER/STENT PLACEMENT;  Surgeon: Nickie Retort, MD;  Location: [facility];  Service: Urology;  Laterality: Left;   DILATION AND CURETTAGE OF UTERUS  1980   w/ Suction for miscarriage  x2   [place]  reports that she quit smoking about 44 years ago. Her smoking use included cigarettes. She has a 5.00 pack-year smoking history. She has never used smokeless tobacco. She reports that she does not drink alcohol and does not use drugs.  family history includes Arthritis in her mother; Congestive Heart Failure in her father; Coronary artery disease in her mother; Diabetes in her brother and sister; Heart attack in her father and maternal aunt; Heart disease in her sister; Hyperlipidemia in her brother, sister, sister, and sister; Other in her brother.  No Known Allergies     PHYSICAL EXAMINATION:  Vital signs: BP (!) 142/78   Pulse 71   Temp (!) 97.3 F (36.3 C)   Ht '5\' 3"'$  (1.6 m)   Wt 166 lb (75.3 kg)   SpO2 98%   BMI 29.41 kg/m  General:  Well-developed, well-nourished, no acute distress HEENT: Sclerae are anicteric, conjunctiva pink. Oral mucosa intact Lungs: Clear Heart: Regular Abdomen: soft, nontender, nondistended, no obvious ascites, no peritoneal signs, normal bowel sounds. No organomegaly. Extremities: No edema Psychiatric: alert and oriented x3. Cooperative     ASSESSMENT:  1.  Colon cancer screening.  Average risk   PLAN:   1.  Screening colonoscopy

## 2021-06-27 NOTE — Patient Instructions (Signed)
Please read handouts provided. Continue present medications.   YOU HAD AN ENDOSCOPIC PROCEDURE TODAY AT THE [facility]:   Refer to the procedure report that was given to you for any specific questions about what was found during the examination.  If the procedure report does not answer your questions, please call your gastroenterologist to clarify.  If you requested that your care partner not be given the details of your procedure findings, then the procedure report has been included in a sealed envelope for you to review at your convenience later.  YOU SHOULD EXPECT: Some feelings of bloating in the abdomen. Passage of more gas than usual.  Walking can help get rid of the air that was put into your GI tract during the procedure and reduce the bloating. If you had a lower endoscopy (such as a colonoscopy or flexible sigmoidoscopy) you may notice spotting of blood in your stool or on the toilet paper. If you underwent a bowel prep for your procedure, you may not have a normal bowel movement for a few days.  Please Note:  You might notice some irritation and congestion in your nose or some drainage.  This is from the oxygen used during your procedure.  There is no need for concern and it should clear up in a day or so.  SYMPTOMS TO REPORT IMMEDIATELY:  Following lower endoscopy (colonoscopy or flexible sigmoidoscopy):  Excessive amounts of blood in the stool  Significant tenderness or worsening of abdominal pains  Swelling of the abdomen that is new, acute  Fever of 100F or higher   For urgent or emergent issues, a gastroenterologist can be reached at any hour by calling [phone]. Do not use MyChart messaging for urgent concerns.    DIET:  We do recommend a small meal at first, but then you may proceed to your regular diet.  Drink plenty of fluids but you should avoid alcoholic beverages for 24 hours.  ACTIVITY:  You should plan to take it easy for the rest of today and  you should NOT DRIVE or use heavy machinery until tomorrow (because of the sedation medicines used during the test).    FOLLOW UP: Our staff will call the number listed on your records 48-72 hours following your procedure to check on you and address any questions or concerns that you may have regarding the information given to you following your procedure. If we do not reach you, we will leave a message.  We will attempt to reach you two times.  During this call, we will ask if you have developed any symptoms of COVID 19. If you develop any symptoms (ie: fever, flu-like symptoms, shortness of breath, cough etc.) before then, please call [phone].  If you test positive for Covid 19 in the 2 weeks post procedure, please call and report this information to us.    If any biopsies were taken you will be contacted by phone or by letter within the next 1-3 weeks.  Please call us at [phone] if you have not heard about the biopsies in 3 weeks.    SIGNATURES/CONFIDENTIALITY: You and/or your care partner have signed paperwork which will be entered into your electronic medical record.  These signatures attest to the fact that that the information above on your After Visit Summary has been reviewed and is understood.  Full responsibility of the confidentiality of this discharge information lies with you and/or your care-partner.  

## 2021-06-27 NOTE — Op Note (Signed)
[place] Patient Name: Tina Aguilar Procedure Date: 06/27/2021 12:18 PM MRN: [id number] Endoscopist: Docia Chuck. [name] , MD Age: 65 Referring MD:  Date of Birth: [birth year] Gender: Female Account #: [id number] Procedure:                Colonoscopy Indications:              Screening for colorectal malignant neoplasm.                            Negative index exam 2012 Medicines:                Monitored Anesthesia Care Procedure:                Pre-Anesthesia Assessment:                           - Prior to the procedure, a History and Physical                            was performed, and patient medications and                            allergies were reviewed. The patient's tolerance of                            previous anesthesia was also reviewed. The risks                            and benefits of the procedure and the sedation                            options and risks were discussed with the patient.                            All questions were answered, and informed consent                            was obtained. Prior Anticoagulants: The patient has                            taken no previous anticoagulant or antiplatelet                            agents. ASA Grade Assessment: II - A patient with                            mild systemic disease. After reviewing the risks                            and benefits, the patient was deemed in                            satisfactory condition to undergo the procedure.  After obtaining informed consent, the colonoscope                            was passed under direct vision. Throughout the                            procedure, the patient's blood pressure, pulse, and                            oxygen saturations were monitored continuously. The                            CF [id number] was introduced through the anus                            and advanced to the the cecum,  identified by                            appendiceal orifice and ileocecal valve. The                            ileocecal valve, appendiceal orifice, and rectum                            were photographed. The quality of the bowel                            preparation was excellent. The colonoscopy was                            performed without difficulty. The patient tolerated                            the procedure well. The bowel preparation used was                            SUPREP via split dose instruction. Scope In: 12:24:34 PM Scope Out: 12:37:27 PM Scope Withdrawal Time: 0 hours 9 minutes 19 seconds  Total Procedure Duration: 0 hours 12 minutes 53 seconds  Findings:                 Multiple diverticula were found in the sigmoid                            colon.                           The exam was otherwise without abnormality on                            direct and retroflexion views. Complications:            No immediate complications. Estimated blood loss:  None. Estimated Blood Loss:     Estimated blood loss: none. Impression:               - Diverticulosis in the sigmoid colon.                           - The examination was otherwise normal on direct                            and retroflexion views.                           - No specimens collected. Recommendation:           - Repeat colonoscopy in 10 years for screening                            purposes.                           - Patient has a contact number available for                            emergencies. The signs and symptoms of potential                            delayed complications were discussed with the                            patient. Return to normal activities tomorrow.                            Written discharge instructions were provided to the                            patient.                           - Resume previous diet.                            - Continue present medications. Docia Chuck. [name], MD 06/27/2021 12:43:18 PM This report has been signed electronically.

## 2021-06-29 ENCOUNTER — Telehealth: Payer: Self-pay

## 2021-06-29 NOTE — Telephone Encounter (Signed)
Attempted follow up call. No answer left VM.

## 2021-07-06 ENCOUNTER — Encounter: Payer: Self-pay | Admitting: Nurse Practitioner

## 2021-07-06 ENCOUNTER — Other Ambulatory Visit: Payer: Self-pay

## 2021-07-06 ENCOUNTER — Telehealth (INDEPENDENT_AMBULATORY_CARE_PROVIDER_SITE_OTHER): Payer: Medicare PPO | Admitting: Nurse Practitioner

## 2021-07-06 VITALS — Ht 63.0 in | Wt 165.0 lb

## 2021-07-06 DIAGNOSIS — U071 COVID-19: Secondary | ICD-10-CM | POA: Insufficient documentation

## 2021-07-06 MED ORDER — MOLNUPIRAVIR EUA 200MG CAPSULE: 4.0000 | ORAL_CAPSULE | Freq: Two times a day (BID) | ORAL | 0 refills | Status: AC

## 2021-07-06 NOTE — Assessment & Plan Note (Signed)
Patient took at home test was positive for COVID-19.  She is vaccinated against COVID with 2 vaccines and 2 boosters.  Patient does have several risk factors to for progressing to severe disease.  Did discuss treatment options and joint decision-making we will start molnupiravir.  Discussed that this medication is emergency use authorized only discussed common side effects. Sent medication to pharmacy she requested.  Begin medication immediately  She may continue taking Aleve over-the-counter for symptomatic relief.

## 2021-07-06 NOTE — Progress Notes (Addendum)
Patient ID: Tina Aguilar, female    DOB: Feb 19, 1956, 65 y.o.   MRN: [id number]  Virtual visit completed through [employer], a video enabled telemedicine application. Due to national recommendations of social distancing due to COVID-19, a virtual visit is felt to be most appropriate for this patient at this time. Reviewed limitations, risks, security and privacy concerns of performing a virtual visit and the availability of in person appointments. I also reviewed that there may be a patient responsible charge related to this service. The patient agreed to proceed.   Was able to see the patient but not hear her. She was having trouble with my voice on her end. We revert to a telephone visit.  Patient location: home Provider location: [place] at [facility], office Persons participating in this virtual visit: patient, provider   If any vitals were documented, they were collected by patient at home unless specified below.    Ht 5' 3" (1.6 m)   Wt 165 lb (74.8 kg)   BMI 29.23 kg/m    CC: Covid 19 infection Subjective:   HPI: Tina Aguilar is a 65 y.o. female presenting on 07/06/2021 for Cough, Covid Positive, Sore Throat, Wheezing, Headache, and Generalized Body Aches  Symptoms started Monday 07/02/2021 At home covid test Wednesday and it was positive. Has had 2 covid vaccines Therapist, music) and two booster. Aleve OTC with relief, minimal relief       Relevant past medical, surgical, family and social history reviewed and updated as indicated. Interim medical history since our last visit reviewed. Allergies and medications reviewed and updated. Outpatient Medications Prior to Visit  Medication Sig Dispense Refill   Ascorbic Acid (VITAMIN C PO) Take 2 tablets by mouth daily at 6 (six) AM.     ezetimibe-simvastatin (VYTORIN) 10-10 MG tablet Take 1 tablet by mouth at bedtime. 90 tablet 3   Multiple Vitamin (MULTIVITAMIN) capsule Take 2 capsules by mouth daily.     Na  Sulfate-K Sulfate-Mg Sulf 17.5-3.13-1.6 GM/177ML SOLN Take 1 kit by mouth as directed. 354 mL 0   Naproxen Sodium (ALEVE PO) Take 1 tablet by mouth daily as needed.     VITAMIN D PO Take 2 tablets by mouth daily at 6 (six) AM.     No facility-administered medications prior to visit.     Per HPI unless specifically indicated in ROS section below Review of Systems  Constitutional:  Positive for chills, fatigue and fever.  HENT:  Positive for congestion and sore throat.   Respiratory:  Positive for cough. Negative for shortness of breath.   Cardiovascular:  Negative for chest pain.  Gastrointestinal:  Negative for abdominal pain, diarrhea, nausea and vomiting.  Musculoskeletal:  Positive for myalgias.  Neurological:  Positive for headaches.  Objective:  Ht 5' 3" (1.6 m)   Wt 165 lb (74.8 kg)   BMI 29.23 kg/m   Wt Readings from Last 3 Encounters:  07/06/21 165 lb (74.8 kg)  06/27/21 166 lb (75.3 kg)  06/13/21 166 lb (75.3 kg)       Physical exam: Gen: alert, NAD, not ill appearing Pulm: speaks in complete sentences without increased work of breathing Psych: normal mood, normal thought content      Results for orders placed or performed in visit on 08/07/20  Comp Met (CMET)  Result Value Ref Range   Glucose, Bld 95 65 - 99 mg/dL   BUN 16 7 - 25 mg/dL   Creat 0.54 0.50 - 0.99 mg/dL   BUN/Creatinine  Ratio NOT APPLICABLE 6 - 22 (calc)   Sodium 141 135 - 146 mmol/L   Potassium 4.2 3.5 - 5.3 mmol/L   Chloride 107 98 - 110 mmol/L   CO2 26 20 - 32 mmol/L   Calcium 9.9 8.6 - 10.4 mg/dL   Total Protein 7.1 6.1 - 8.1 g/dL   Albumin 4.1 3.6 - 5.1 g/dL   Globulin 3.0 1.9 - 3.7 g/dL (calc)   AG Ratio 1.4 1.0 - 2.5 (calc)   Total Bilirubin 0.5 0.2 - 1.2 mg/dL   Alkaline phosphatase (APISO) 66 37 - 153 U/L   AST 30 10 - 35 U/L   ALT 35 (H) 6 - 29 U/L   Assessment & Plan:   Problem List Items Addressed This Visit       Other   COVID-19 virus infection - Primary    Patient  took at home test was positive for COVID-19.  She is vaccinated against COVID with 2 vaccines and 2 boosters.  Patient does have several risk factors to for progressing to severe disease.  Did discuss treatment options and joint decision-making we will start molnupiravir.  Discussed that this medication is emergency use authorized only discussed common side effects. Sent medication to pharmacy she requested.  Begin medication immediately  She may continue taking Aleve over-the-counter for symptomatic relief.      Relevant Medications   molnupiravir EUA (LAGEVRIO) 200 mg CAPS capsule     No orders of the defined types were placed in this encounter.  No orders of the defined types were placed in this encounter.  Phone call lasted 9 minutes and 15 seconds  I discussed the assessment and treatment plan with the patient. The patient was provided an opportunity to ask questions and all were answered. The patient agreed with the plan and demonstrated an understanding of the instructions. The patient was advised to call back or seek an in-person evaluation if the symptoms worsen or if the condition fails to improve as anticipated.  Follow up plan: No follow-ups on file.  Romilda Garret, NP

## 2021-07-20 DIAGNOSIS — S39012A Strain of muscle, fascia and tendon of lower back, initial encounter: Secondary | ICD-10-CM | POA: Diagnosis not present

## 2021-07-20 DIAGNOSIS — S29012A Strain of muscle and tendon of back wall of thorax, initial encounter: Secondary | ICD-10-CM | POA: Diagnosis not present

## 2021-07-20 DIAGNOSIS — M25552 Pain in left hip: Secondary | ICD-10-CM | POA: Diagnosis not present

## 2021-07-20 DIAGNOSIS — M9903 Segmental and somatic dysfunction of lumbar region: Secondary | ICD-10-CM | POA: Diagnosis not present

## 2021-07-20 DIAGNOSIS — S338XXA Sprain of other parts of lumbar spine and pelvis, initial encounter: Secondary | ICD-10-CM | POA: Diagnosis not present

## 2021-07-20 DIAGNOSIS — M25551 Pain in right hip: Secondary | ICD-10-CM | POA: Diagnosis not present

## 2021-07-20 DIAGNOSIS — M9902 Segmental and somatic dysfunction of thoracic region: Secondary | ICD-10-CM | POA: Diagnosis not present

## 2021-07-20 DIAGNOSIS — M9906 Segmental and somatic dysfunction of lower extremity: Secondary | ICD-10-CM | POA: Diagnosis not present

## 2021-07-20 DIAGNOSIS — M9905 Segmental and somatic dysfunction of pelvic region: Secondary | ICD-10-CM | POA: Diagnosis not present

## 2021-07-26 ENCOUNTER — Encounter: Payer: Self-pay | Admitting: Family Medicine

## 2021-07-26 ENCOUNTER — Other Ambulatory Visit: Payer: Self-pay

## 2021-07-26 ENCOUNTER — Ambulatory Visit (INDEPENDENT_AMBULATORY_CARE_PROVIDER_SITE_OTHER): Payer: Medicare PPO | Admitting: Family Medicine

## 2021-07-26 VITALS — BP 111/74 | HR 71 | Temp 98.2°F | Ht 63.0 in | Wt 161.4 lb

## 2021-07-26 DIAGNOSIS — Z23 Encounter for immunization: Secondary | ICD-10-CM | POA: Diagnosis not present

## 2021-07-26 DIAGNOSIS — E7800 Pure hypercholesterolemia, unspecified: Secondary | ICD-10-CM | POA: Diagnosis not present

## 2021-07-26 DIAGNOSIS — R739 Hyperglycemia, unspecified: Secondary | ICD-10-CM

## 2021-07-26 DIAGNOSIS — Z6828 Body mass index (BMI) 28.0-28.9, adult: Secondary | ICD-10-CM

## 2021-07-26 DIAGNOSIS — E663 Overweight: Secondary | ICD-10-CM | POA: Diagnosis not present

## 2021-07-26 DIAGNOSIS — Z0001 Encounter for general adult medical examination with abnormal findings: Secondary | ICD-10-CM | POA: Diagnosis not present

## 2021-07-26 DIAGNOSIS — M160 Bilateral primary osteoarthritis of hip: Secondary | ICD-10-CM

## 2021-07-26 LAB — CBC
HCT: 41.9 % (ref 36.0–46.0)
Hemoglobin: 13.6 g/dL (ref 12.0–15.0)
MCHC: 32.4 g/dL (ref 30.0–36.0)
MCV: 91.6 fl (ref 78.0–100.0)
Platelets: 259 10*3/uL (ref 150.0–400.0)
RBC: 4.58 Mil/uL (ref 3.87–5.11)
RDW: 14.6 % (ref 11.5–15.5)
WBC: 5.1 10*3/uL (ref 4.0–10.5)

## 2021-07-26 LAB — LIPID PANEL
Cholesterol: 225 mg/dL — ABNORMAL HIGH (ref 0–200)
HDL: 72.7 mg/dL (ref >39.00)
LDL Cholesterol: 137 mg/dL — ABNORMAL HIGH (ref 0–99)
NonHDL: 152.12
Total CHOL/HDL Ratio: 3
Triglycerides: 74 mg/dL (ref 0.0–149.0)
VLDL: 14.8 mg/dL (ref 0.0–40.0)

## 2021-07-26 LAB — COMPREHENSIVE METABOLIC PANEL
ALT: 28 U/L (ref 0–35)
AST: 25 U/L (ref 0–37)
Albumin: 4.3 g/dL (ref 3.5–5.2)
Alkaline Phosphatase: 72 U/L (ref 39–117)
BUN: 15 mg/dL (ref 6–23)
CO2: 29 mEq/L (ref 19–32)
Calcium: 9.7 mg/dL (ref 8.4–10.5)
Chloride: 106 mEq/L (ref 96–112)
Creatinine, Ser: 0.68 mg/dL (ref 0.40–1.20)
GFR: 90.86 mL/min (ref >60.00)
Glucose, Bld: 91 mg/dL (ref 70–99)
Potassium: 4.1 mEq/L (ref 3.5–5.1)
Sodium: 141 mEq/L (ref 135–145)
Total Bilirubin: 0.7 mg/dL (ref 0.2–1.2)
Total Protein: 7.5 g/dL (ref 6.0–8.3)

## 2021-07-26 LAB — HEMOGLOBIN A1C: Hgb A1c MFr Bld: 5.7 % (ref 4.6–6.5)

## 2021-07-26 LAB — TSH: TSH: 2 u[IU]/mL (ref 0.35–5.50)

## 2021-07-26 MED ORDER — EZETIMIBE-SIMVASTATIN 10-10 MG PO TABS: 1.0000 | ORAL_TABLET | Freq: Every day | ORAL | 3 refills | Status: DC

## 2021-07-26 NOTE — Assessment & Plan Note (Signed)
Check labs.  Continue current dose of Vytorin.  Continue lifestyle modifications.

## 2021-07-26 NOTE — Progress Notes (Signed)
Chief Complaint:  Tina Aguilar is a 65 y.o. female who presents today for her annual comprehensive physical exam.    Assessment/Plan:  Chronic Problems Addressed Today: Hyperlipidemia Check labs.  Continue current dose of Vytorin.  Continue lifestyle modifications.  Osteoarthritis Continue management per orthopedics.  She has had a little flare recently.  Does not want to see physical therapy again at this point though she will let me know if she changes her mind.   Preventative Healthcare: Will get blood work done today. Wants repeat pap in 2024. UTD on mammogram and colonoscopy. Will get pneumonia vaccine today.   Patient Counseling(The following topics were reviewed and/or handout was given):  -Nutrition: Stressed importance of moderation in sodium/caffeine intake, saturated fat and cholesterol, caloric balance, sufficient intake of fresh fruits, vegetables, and fiber.  -Stressed the importance of regular exercise.   -Substance Abuse: Discussed cessation/primary prevention of tobacco, alcohol, or other drug use; driving or other dangerous activities under the influence; availability of treatment for abuse.   -Injury prevention: Discussed safety belts, safety helmets, smoke detector, smoking near bedding or upholstery.   -Sexuality: Discussed sexually transmitted diseases, partner selection, use of condoms, avoidance of unintended pregnancy and contraceptive alternatives.   -Dental health: Discussed importance of regular tooth brushing, flossing, and dental visits.  -Health maintenance and immunizations reviewed. Please refer to Health maintenance section.  Return to care in 1 year for next preventative visit.     Subjective:  HPI:  She has no acute complaints today.   She had mild Covid a few weeks ago. She recovered well through medications.  Denies any lingering symptoms at this time.  Lifestyle Diet: Trying to cut out processed food and sugar. Exercise: Yoga  and likes to walk with her dog.  Depression screen PHQ 2/9 07/26/2021  Decreased Interest 0  Down, Depressed, Hopeless 0  PHQ - 2 Score 0  Altered sleeping -  Tired, decreased energy -  Change in appetite -  Feeling bad or failure about yourself  -  Trouble concentrating -  Moving slowly or fidgety/restless -  Suicidal thoughts -  PHQ-9 Score -  Difficult doing work/chores -    There are no preventive care reminders to display for this patient.    ROS: Per HPI, otherwise a complete review of systems was negative.   PMH:  The following were reviewed and entered/updated in epic: Past Medical History:  Diagnosis Date   Arthritis    hips, hands   Chronic kidney disease    hx of kidney stones   Frequency of urination    History of adenomatous polyp of colon 08/28/2005   Hyperlipidemia, mixed    on meds   Left ureteral stone    Osteoarthritis    bilateral hips/lower back   Spinal stenosis    Vertigo    Wears glasses    Patient Active Problem List   Diagnosis Date Noted   Osteoarthritis 03/17/2020   Vertigo 03/17/2020   Binge eating disorder 05/27/2017   Headache 05/27/2017   Colon polyps 08/30/2010   Hyperlipidemia 06/30/2007   Past Surgical History:  Procedure Laterality Date   [place]   and 1993   COLONOSCOPY  2012   JP-F/V-movi(exc)-normal   CYSTOSCOPY/URETEROSCOPY/HOLMIUM LASER/STENT PLACEMENT Left 05/06/2017   Procedure: CYSTOSCOPY/URETEROSCOPY/HOLMIUM LASER/STENT PLACEMENT;  Surgeon: Nickie Retort, MD;  Location: [facility];  Service: Urology;  Laterality: Left;   DILATION AND CURETTAGE OF UTERUS  1980   w/ Suction for  miscarriage  x2   WISDOM TOOTH EXTRACTION  1976    Family History  Problem Relation Age of Onset   Coronary artery disease Mother    Arthritis Mother    Congestive Heart Failure Father    Heart attack Father    Hyperlipidemia Sister    Diabetes Sister    Heart disease Sister    Hyperlipidemia  Sister    Hyperlipidemia Sister    Other Brother        coronary artery bypass graft age 42   Hyperlipidemia Brother    Diabetes Brother    Heart attack Maternal Aunt        at age 35   Breast cancer Neg Hx    Colon polyps Neg Hx    Colon cancer Neg Hx    Esophageal cancer Neg Hx    Rectal cancer Neg Hx    Stomach cancer Neg Hx     Medications- reviewed and updated Current Outpatient Medications  Medication Sig Dispense Refill   Ascorbic Acid (VITAMIN C PO) Take 2 tablets by mouth daily at 6 (six) AM.     Multiple Vitamin (MULTIVITAMIN) capsule Take 2 capsules by mouth daily.     Naproxen Sodium (ALEVE PO) Take 1 tablet by mouth daily as needed.     VITAMIN D PO Take 2 tablets by mouth daily at 6 (six) AM.     ezetimibe-simvastatin (VYTORIN) 10-10 MG tablet Take 1 tablet by mouth at bedtime. 90 tablet 3   Na Sulfate-K Sulfate-Mg Sulf 17.5-3.13-1.6 GM/177ML SOLN Take 1 kit by mouth as directed. (Patient not taking: Reported on 07/26/2021) 354 mL 0   No current facility-administered medications for this visit.    Allergies-reviewed and updated Allergies  Allergen Reactions   Diclofenac Other (See Comments)    Dizziness    Social History   Socioeconomic History   Marital status: Married    Spouse name: Not on file   Number of children: Not on file   Years of education: Not on file   Highest education level: Not on file  Occupational History   Not on file  Tobacco Use   Smoking status: Former    Packs/day: 1.00    Years: 5.00    Pack years: 5.00    Types: Cigarettes    Quit date: 04/30/1977    Years since quitting: 44.2   Smokeless tobacco: Never  Vaping Use   Vaping Use: Never used  Substance and Sexual Activity   Alcohol use: No    Alcohol/week: 0.0 standard drinks   Drug use: No   Sexual activity: Not on file  Other Topics Concern   Not on file  Social History Narrative   [employer], [employer]   Married '84   Two grown daughters '91 '93    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 Wt Readings from Last 3 Encounters:  07/26/21 161 lb 6.4 oz (73.2 kg)  07/06/21 165 lb (74.8 kg)  06/27/21 166 lb (75.3 kg)   Gen: NAD, resting comfortably HEENT: TMs normal bilaterally. OP clear. No thyromegaly noted.  CV: RRR with no murmurs appreciated Pulm: NWOB, CTAB with no crackles, wheezes, or rhonchi GI: Normal bowel sounds present. Soft, Nontender, Nondistended. MSK: no edema, cyanosis, or clubbing noted Skin: warm, dry Neuro: CN2-12 grossly intact. Strength 5/5 in upper and lower extremities. Reflexes symmetric and intact bilaterally.  Psych: Normal affect and thought content      I,Savera Zaman,acting as a scribe for Dimas Chyle, MD.,have documented all relevant documentation on the behalf of Dimas Chyle, MD,as directed by  Dimas Chyle, MD while in the presence of Dimas Chyle, MD.   I, Dimas Chyle, MD, have reviewed all documentation for this visit. The documentation on 07/26/21 for the exam, diagnosis, procedures, and orders are all accurate and complete.  Algis Greenhouse. Jerline Pain, MD 07/26/2021 10:31 AM

## 2021-07-26 NOTE — Patient Instructions (Signed)
It was very nice to see you today!  We will check blood work today.  We will get your pneumonia vaccine today.  Please let me know if you like to see a physical therapist for your hips.  I will see back in year for your next physical.  Come back sooner if needed.  Take care, Dr Jerline Pain  PLEASE NOTE:  If you had any lab tests please let us know if you have not heard back within a few days. You may see your results on mychart before we have a chance to review them but we will give you a call once they are reviewed by [place]. If we ordered any referrals today, please let us know if you have not heard from their office within the next week.   Please try these tips to maintain a healthy lifestyle:  Eat at least 3 REAL meals and 1-2 snacks per day.  Aim for no more than 5 hours between eating.  If you eat breakfast, please do so within one hour of getting up.   Each meal should contain half fruits/vegetables, one quarter protein, and one quarter carbs (no bigger than a computer mouse)  Cut down on sweet beverages. This includes juice, soda, and sweet tea.   Drink at least 1 glass of water with each meal and aim for at least 8 glasses per day  Exercise at least 150 minutes every week.    Preventive Care 39 Years and Older, Female Preventive care refers to lifestyle choices and visits with your health care provider that can promote health and wellness. This includes: A yearly physical exam. This is also called an annual wellness visit. Regular dental and eye exams. Immunizations. Screening for certain conditions. Healthy lifestyle choices, such as: Eating a healthy diet. Getting regular exercise. Not using drugs or products that contain nicotine and tobacco. Limiting alcohol use. What can I expect for my preventive care visit? Physical exam Your health care provider will check your: Height and weight. These may be used to calculate your BMI (body mass index). BMI is a measurement that  tells if you are at a healthy weight. Heart rate and blood pressure. Body temperature. Skin for abnormal spots. Counseling Your health care provider may ask you questions about your: Past medical problems. Family's medical history. Alcohol, tobacco, and drug use. Emotional well-being. Home life and relationship well-being. Sexual activity. Diet, exercise, and sleep habits. History of falls. Memory and ability to understand (cognition). Work and work Statistician. Pregnancy and menstrual history. Access to firearms. What immunizations do I need? Vaccines are usually given at various ages, according to a schedule. Your health care provider will recommend vaccines for you based on your age, medical history, and lifestyle or other factors, such as travel or where you work. What tests do I need? Blood tests Lipid and cholesterol levels. These may be checked every 5 years, or more often depending on your overall health. Hepatitis C test. Hepatitis B test. Screening Lung cancer screening. You may have this screening every year starting at age 41 if you have a 30-pack-year history of smoking and currently smoke or have quit within the past 15 years. Colorectal cancer screening. All adults should have this screening starting at age 62 and continuing until age 75. Your health care provider may recommend screening at age 76 if you are at increased risk. You will have tests every 1-10 years, depending on your results and the type of screening test. Diabetes screening. This is  done by checking your blood sugar (glucose) after you have not eaten for a while (fasting). You may have this done every 1-3 years. Mammogram. This may be done every 1-2 years. Talk with your health care provider about how often you should have regular mammograms. Abdominal aortic aneurysm (AAA) screening. You may need this if you are a current or former smoker. BRCA-related cancer screening. This may be done if you  have a family history of breast, ovarian, tubal, or peritoneal cancers. Other tests STD (sexually transmitted disease) testing, if you are at risk. Bone density scan. This is done to screen for osteoporosis. You may have this done starting at age 87. Talk with your health care provider about your test results, treatment options, and if necessary, the need for more tests. Follow these instructions at home: Eating and drinking  Eat a diet that includes fresh fruits and vegetables, whole grains, lean protein, and low-fat dairy products. Limit your intake of foods with high amounts of sugar, saturated fats, and salt. Take vitamin and mineral supplements as recommended by your health care provider. Do not drink alcohol if your health care provider tells you not to drink. If you drink alcohol: Limit how much you have to 0-1 drink a day. Be aware of how much alcohol is in your drink. In the [place] one drink equals one 12 oz bottle of beer (355 mL), one 5 oz glass of wine (148 mL), or one 1 oz glass of hard liquor (44 mL). Lifestyle Take daily care of your teeth and gums. Brush your teeth every morning and night with fluoride toothpaste. Floss one time each day. Stay active. Exercise for at least 30 minutes 5 or more days each week. Do not use any products that contain nicotine or tobacco, such as cigarettes, e-cigarettes, and chewing tobacco. If you need help quitting, ask your health care provider. Do not use drugs. If you are sexually active, practice safe sex. Use a condom or other form of protection in order to prevent STIs (sexually transmitted infections). Talk with your health care provider about taking a low-dose aspirin or statin. Find healthy ways to cope with stress, such as: Meditation, yoga, or listening to music. Journaling. Talking to a trusted person. Spending time with friends and family. Safety Always wear your seat belt while driving or riding in a vehicle. Do not drive: If  you have been drinking alcohol. Do not ride with someone who has been drinking. When you are tired or distracted. While texting. Wear a helmet and other protective equipment during sports activities. If you have firearms in your house, make sure you follow all gun safety procedures. What's next? Visit your health care provider once a year for an annual wellness visit. Ask your health care provider how often you should have your eyes and teeth checked. Stay up to date on all vaccines. This information is not intended to replace advice given to you by your health care provider. Make sure you discuss any questions you have with your health care provider. Document Revised: 12/08/2020 Document Reviewed: 09/24/2018 Elsevier Patient Education  2022 [employer].

## 2021-07-26 NOTE — Assessment & Plan Note (Signed)
Continue management per orthopedics.  She has had a little flare recently.  Does not want to see physical therapy again at this point though she will let me know if she changes her mind.

## 2021-07-27 NOTE — Progress Notes (Signed)
Please inform patient of the following:  Her cholesterol and blood sugar levels are borderline but stable. Do not need to make any changes to her treatment plan at this time. She should continue working on diet and exercise and we can recheck in a year.  Algis Greenhouse. Jerline Pain, MD 07/27/2021 9:31 AM

## 2021-08-17 DIAGNOSIS — M9906 Segmental and somatic dysfunction of lower extremity: Secondary | ICD-10-CM | POA: Diagnosis not present

## 2021-08-17 DIAGNOSIS — S338XXA Sprain of other parts of lumbar spine and pelvis, initial encounter: Secondary | ICD-10-CM | POA: Diagnosis not present

## 2021-08-17 DIAGNOSIS — S29012A Strain of muscle and tendon of back wall of thorax, initial encounter: Secondary | ICD-10-CM | POA: Diagnosis not present

## 2021-08-17 DIAGNOSIS — S39012A Strain of muscle, fascia and tendon of lower back, initial encounter: Secondary | ICD-10-CM | POA: Diagnosis not present

## 2021-08-17 DIAGNOSIS — M9903 Segmental and somatic dysfunction of lumbar region: Secondary | ICD-10-CM | POA: Diagnosis not present

## 2021-08-17 DIAGNOSIS — M25551 Pain in right hip: Secondary | ICD-10-CM | POA: Diagnosis not present

## 2021-08-17 DIAGNOSIS — M9902 Segmental and somatic dysfunction of thoracic region: Secondary | ICD-10-CM | POA: Diagnosis not present

## 2021-08-17 DIAGNOSIS — M9905 Segmental and somatic dysfunction of pelvic region: Secondary | ICD-10-CM | POA: Diagnosis not present

## 2021-08-17 DIAGNOSIS — M25552 Pain in left hip: Secondary | ICD-10-CM | POA: Diagnosis not present

## 2021-09-14 DIAGNOSIS — M25551 Pain in right hip: Secondary | ICD-10-CM | POA: Diagnosis not present

## 2021-09-14 DIAGNOSIS — S39012A Strain of muscle, fascia and tendon of lower back, initial encounter: Secondary | ICD-10-CM | POA: Diagnosis not present

## 2021-09-14 DIAGNOSIS — M25552 Pain in left hip: Secondary | ICD-10-CM | POA: Diagnosis not present

## 2021-09-14 DIAGNOSIS — M9906 Segmental and somatic dysfunction of lower extremity: Secondary | ICD-10-CM | POA: Diagnosis not present

## 2021-09-14 DIAGNOSIS — M9903 Segmental and somatic dysfunction of lumbar region: Secondary | ICD-10-CM | POA: Diagnosis not present

## 2021-09-14 DIAGNOSIS — M9905 Segmental and somatic dysfunction of pelvic region: Secondary | ICD-10-CM | POA: Diagnosis not present

## 2021-09-14 DIAGNOSIS — S338XXA Sprain of other parts of lumbar spine and pelvis, initial encounter: Secondary | ICD-10-CM | POA: Diagnosis not present

## 2021-09-14 DIAGNOSIS — S29012A Strain of muscle and tendon of back wall of thorax, initial encounter: Secondary | ICD-10-CM | POA: Diagnosis not present

## 2021-09-14 DIAGNOSIS — M9902 Segmental and somatic dysfunction of thoracic region: Secondary | ICD-10-CM | POA: Diagnosis not present

## 2021-09-27 ENCOUNTER — Encounter: Payer: Self-pay | Admitting: Family

## 2021-09-27 ENCOUNTER — Other Ambulatory Visit: Payer: Self-pay

## 2021-09-27 ENCOUNTER — Ambulatory Visit: Payer: Medicare PPO | Admitting: Family

## 2021-09-27 VITALS — BP 118/80 | HR 77 | Temp 98.1°F | Ht 63.0 in

## 2021-09-27 DIAGNOSIS — R053 Chronic cough: Secondary | ICD-10-CM | POA: Insufficient documentation

## 2021-09-27 DIAGNOSIS — R0982 Postnasal drip: Secondary | ICD-10-CM | POA: Diagnosis not present

## 2021-09-27 MED ORDER — FLUTICASONE PROPIONATE 50 MCG/ACT NA SUSP: 1.0000 | Freq: Every day | NASAL | 1 refills | Status: DC

## 2021-09-27 MED ORDER — METHYLPREDNISOLONE ACETATE 80 MG/ML IJ SUSP
80.0000 mg | Freq: Once | INTRAMUSCULAR | Status: AC
  Administered 2021-09-27: 80 mg via INTRAMUSCULAR

## 2021-09-27 NOTE — Patient Instructions (Addendum)
It was very nice to see you today!  I have sent the nasal spray to your pharmacy, start this today to help with your post nasal drip and throat secretions. Use twice a day for 1 week, then decrease to daily use until symptoms are better, then ok to stop. If symptoms return or any other allergy-like symptoms, restart use, remembering it takes 1-2 days to become effective.  PLEASE NOTE:  If you had any lab tests please let us know if you have not heard back within a few days. You may see your results on MyChart before we have a chance to review them but we will give you a call once they are reviewed by [place]. If we ordered any referrals today, please let us know if you have not heard from their office within the next week.   Please try these tips to maintain a healthy lifestyle:  Eat most of your calories during the day when you are active. Eliminate processed foods including packaged sweets (pies, cakes, cookies), reduce intake of potatoes, white bread, white pasta, and white rice. Look for whole grain options, oat flour or almond flour.  Each meal should contain half fruits/vegetables, one quarter protein, and one quarter carbs (no bigger than a computer mouse).  Cut down on sweet beverages. This includes juice, soda, and sweet tea. Also watch fruit intake, though this is a healthier sweet option, it still contains natural sugar! Limit to 3 servings daily.  Drink at least 1 glass of water with each meal and aim for at least 8 glasses per day  Exercise at least 150 minutes every week.

## 2021-09-27 NOTE — Progress Notes (Signed)
Subjective:     Patient ID: Tina Aguilar, female    DOB: [birth year], 65 y.o.   MRN: [id number]  Chief Complaint  Patient presents with   Cough    Dry; starting 2 weeks ago. She has not taken any OTC medications for symptoms.    Hoarse    HPI: Cough: Patient complains of nonproductive cough.  Symptoms began 2 weeks ago.  The cough is non-productive, without wheezing, dyspnea or hemoptysis and is aggravated by cold air and stress Associated symptoms include:change in voice and postnasal drip. Patient does not have new pets. Patient does not have a history of asthma. Patient does not have a history of environmental allergens. Patient did not have recent travel. Patient does not have a history of smoking.   Health Maintenance Due  Topic Date Due   COVID-19 Vaccine (5 - Booster for Pfizer series) 06/25/2021    Past Medical History:  Diagnosis Date   Arthritis    hips, hands   Chronic kidney disease    hx of kidney stones   Frequency of urination    History of adenomatous polyp of colon 08/28/2005   Hyperlipidemia, mixed    on meds   Left ureteral stone    Osteoarthritis    bilateral hips/lower back   Spinal stenosis    Vertigo    Wears glasses     Past Surgical History:  Procedure Laterality Date   [place]   and 1993   COLONOSCOPY  2012   JP-F/V-movi(exc)-normal   CYSTOSCOPY/URETEROSCOPY/HOLMIUM LASER/STENT PLACEMENT Left 05/06/2017   Procedure: CYSTOSCOPY/URETEROSCOPY/HOLMIUM LASER/STENT PLACEMENT;  Surgeon: Nickie Retort, MD;  Location: [facility];  Service: Urology;  Laterality: Left;   DILATION AND CURETTAGE OF UTERUS  1980   w/ Suction for miscarriage  x2   [place]    Outpatient Medications Prior to Visit  Medication Sig Dispense Refill   Ascorbic Acid (VITAMIN C PO) Take 2 tablets by mouth daily at 6 (six) AM.     ezetimibe-simvastatin (VYTORIN) 10-10 MG tablet Take 1 tablet by mouth at  bedtime. 90 tablet 3   Multiple Vitamin (MULTIVITAMIN) capsule Take 2 capsules by mouth daily.     Naproxen Sodium (ALEVE PO) Take 1 tablet by mouth daily as needed.     VITAMIN D PO Take 2 tablets by mouth daily at 6 (six) AM.     Na Sulfate-K Sulfate-Mg Sulf 17.5-3.13-1.6 GM/177ML SOLN Take 1 kit by mouth as directed. (Patient not taking: Reported on 07/26/2021) 354 mL 0   No facility-administered medications prior to visit.    Allergies  Allergen Reactions   Diclofenac Other (See Comments)    Dizziness        Objective:    Physical Exam Vitals and nursing note reviewed.  Constitutional:      Appearance: Normal appearance.  HENT:     Right Ear: Tympanic membrane and ear canal normal.     Left Ear: Tympanic membrane and ear canal normal.     Nose:     Right Sinus: No frontal sinus tenderness.     Left Sinus: No frontal sinus tenderness.     Mouth/Throat:     Mouth: Mucous membranes are moist.     Pharynx: No pharyngeal swelling, oropharyngeal exudate or posterior oropharyngeal erythema.     Comments: Pinpoint pearl spot on left tonsil, does not appear as a stone, pt reports having been told about this before. Cardiovascular:  Rate and Rhythm: Normal rate and regular rhythm.  Pulmonary:     Effort: Pulmonary effort is normal.     Breath sounds: Normal breath sounds.  Musculoskeletal:        General: Normal range of motion.  Skin:    General: Skin is warm and dry.  Neurological:     Mental Status: She is alert.  Psychiatric:        Mood and Affect: Mood normal.        Behavior: Behavior normal.    BP 118/80    Pulse 77    Temp 98.1 F (36.7 C) (Temporal)    Ht '5\' 3"'  (1.6 m)    SpO2 96%    BMI 28.59 kg/m  Wt Readings from Last 3 Encounters:  07/26/21 161 lb 6.4 oz (73.2 kg)  07/06/21 165 lb (74.8 kg)  06/27/21 166 lb (75.3 kg)       Assessment & Plan:   Problem List Items Addressed This Visit       Other   Persistent cough - Primary    Has had for  over  2 weeks, pt has been singing frequently in her church choir, states she has had a lot of hoarseness, better today. Administered steroid injection, advised on SE, drink plenty of fluids, rest voice as much as possible, also starting Flonase due to postnasal drip which can be contributing.      Relevant Medications   methylPREDNISolone acetate (DEPO-MEDROL) injection 80 mg   Post-nasal drip    Probably contributing to her cough, starting Flonase, advised on use & SE.      Relevant Medications   fluticasone (FLONASE) 50 MCG/ACT nasal spray    Meds ordered this encounter  Medications   methylPREDNISolone acetate (DEPO-MEDROL) injection 80 mg   fluticasone (FLONASE) 50 MCG/ACT nasal spray    Sig: Place 1 spray into both nostrils daily. START with 1 spray each nare twice a day for 1 week, then decrease to daily use, can discontinue when symptoms resolve    Dispense:  16 g    Refill:  1    PLEASE PULL OFF SHELF if not covered.    Order Specific Question:   Supervising Provider    Answer:   ANDY, CAMILLE L [[id number]]

## 2021-09-27 NOTE — Assessment & Plan Note (Signed)
Probably contributing to her cough, starting Flonase, advised on use & SE.

## 2021-09-27 NOTE — Assessment & Plan Note (Signed)
Has had for over  2 weeks, pt has been singing frequently in her church choir, states she has had a lot of hoarseness, better today. Administered steroid injection, advised on SE, drink plenty of fluids, rest voice as much as possible, also starting Flonase due to postnasal drip which can be contributing.

## 2021-10-03 DIAGNOSIS — M25551 Pain in right hip: Secondary | ICD-10-CM | POA: Diagnosis not present

## 2021-10-03 DIAGNOSIS — M9902 Segmental and somatic dysfunction of thoracic region: Secondary | ICD-10-CM | POA: Diagnosis not present

## 2021-10-03 DIAGNOSIS — S338XXA Sprain of other parts of lumbar spine and pelvis, initial encounter: Secondary | ICD-10-CM | POA: Diagnosis not present

## 2021-10-03 DIAGNOSIS — M9905 Segmental and somatic dysfunction of pelvic region: Secondary | ICD-10-CM | POA: Diagnosis not present

## 2021-10-03 DIAGNOSIS — S29012A Strain of muscle and tendon of back wall of thorax, initial encounter: Secondary | ICD-10-CM | POA: Diagnosis not present

## 2021-10-03 DIAGNOSIS — M9903 Segmental and somatic dysfunction of lumbar region: Secondary | ICD-10-CM | POA: Diagnosis not present

## 2021-10-03 DIAGNOSIS — S39012A Strain of muscle, fascia and tendon of lower back, initial encounter: Secondary | ICD-10-CM | POA: Diagnosis not present

## 2021-10-03 DIAGNOSIS — M9906 Segmental and somatic dysfunction of lower extremity: Secondary | ICD-10-CM | POA: Diagnosis not present

## 2021-10-03 DIAGNOSIS — M25552 Pain in left hip: Secondary | ICD-10-CM | POA: Diagnosis not present

## 2021-10-16 DIAGNOSIS — S338XXA Sprain of other parts of lumbar spine and pelvis, initial encounter: Secondary | ICD-10-CM | POA: Diagnosis not present

## 2021-10-16 DIAGNOSIS — S39012A Strain of muscle, fascia and tendon of lower back, initial encounter: Secondary | ICD-10-CM | POA: Diagnosis not present

## 2021-10-16 DIAGNOSIS — S29012A Strain of muscle and tendon of back wall of thorax, initial encounter: Secondary | ICD-10-CM | POA: Diagnosis not present

## 2021-10-16 DIAGNOSIS — M9906 Segmental and somatic dysfunction of lower extremity: Secondary | ICD-10-CM | POA: Diagnosis not present

## 2021-10-16 DIAGNOSIS — M9905 Segmental and somatic dysfunction of pelvic region: Secondary | ICD-10-CM | POA: Diagnosis not present

## 2021-10-16 DIAGNOSIS — M25551 Pain in right hip: Secondary | ICD-10-CM | POA: Diagnosis not present

## 2021-10-16 DIAGNOSIS — M9902 Segmental and somatic dysfunction of thoracic region: Secondary | ICD-10-CM | POA: Diagnosis not present

## 2021-10-16 DIAGNOSIS — M25552 Pain in left hip: Secondary | ICD-10-CM | POA: Diagnosis not present

## 2021-10-16 DIAGNOSIS — M9903 Segmental and somatic dysfunction of lumbar region: Secondary | ICD-10-CM | POA: Diagnosis not present

## 2021-10-24 DIAGNOSIS — S39012A Strain of muscle, fascia and tendon of lower back, initial encounter: Secondary | ICD-10-CM | POA: Diagnosis not present

## 2021-10-24 DIAGNOSIS — M9903 Segmental and somatic dysfunction of lumbar region: Secondary | ICD-10-CM | POA: Diagnosis not present

## 2021-10-24 DIAGNOSIS — M9902 Segmental and somatic dysfunction of thoracic region: Secondary | ICD-10-CM | POA: Diagnosis not present

## 2021-10-24 DIAGNOSIS — M25552 Pain in left hip: Secondary | ICD-10-CM | POA: Diagnosis not present

## 2021-10-24 DIAGNOSIS — M9905 Segmental and somatic dysfunction of pelvic region: Secondary | ICD-10-CM | POA: Diagnosis not present

## 2021-10-24 DIAGNOSIS — S338XXA Sprain of other parts of lumbar spine and pelvis, initial encounter: Secondary | ICD-10-CM | POA: Diagnosis not present

## 2021-10-24 DIAGNOSIS — S29012A Strain of muscle and tendon of back wall of thorax, initial encounter: Secondary | ICD-10-CM | POA: Diagnosis not present

## 2021-10-24 DIAGNOSIS — M25551 Pain in right hip: Secondary | ICD-10-CM | POA: Diagnosis not present

## 2021-10-24 DIAGNOSIS — M9906 Segmental and somatic dysfunction of lower extremity: Secondary | ICD-10-CM | POA: Diagnosis not present

## 2021-10-29 DIAGNOSIS — M9903 Segmental and somatic dysfunction of lumbar region: Secondary | ICD-10-CM | POA: Diagnosis not present

## 2021-10-29 DIAGNOSIS — M25552 Pain in left hip: Secondary | ICD-10-CM | POA: Diagnosis not present

## 2021-10-29 DIAGNOSIS — S39012A Strain of muscle, fascia and tendon of lower back, initial encounter: Secondary | ICD-10-CM | POA: Diagnosis not present

## 2021-10-29 DIAGNOSIS — M9902 Segmental and somatic dysfunction of thoracic region: Secondary | ICD-10-CM | POA: Diagnosis not present

## 2021-10-29 DIAGNOSIS — S29012A Strain of muscle and tendon of back wall of thorax, initial encounter: Secondary | ICD-10-CM | POA: Diagnosis not present

## 2021-10-29 DIAGNOSIS — M9906 Segmental and somatic dysfunction of lower extremity: Secondary | ICD-10-CM | POA: Diagnosis not present

## 2021-10-29 DIAGNOSIS — S338XXA Sprain of other parts of lumbar spine and pelvis, initial encounter: Secondary | ICD-10-CM | POA: Diagnosis not present

## 2021-10-29 DIAGNOSIS — M9905 Segmental and somatic dysfunction of pelvic region: Secondary | ICD-10-CM | POA: Diagnosis not present

## 2021-10-29 DIAGNOSIS — M25551 Pain in right hip: Secondary | ICD-10-CM | POA: Diagnosis not present

## 2021-11-05 DIAGNOSIS — S39012A Strain of muscle, fascia and tendon of lower back, initial encounter: Secondary | ICD-10-CM | POA: Diagnosis not present

## 2021-11-05 DIAGNOSIS — M9902 Segmental and somatic dysfunction of thoracic region: Secondary | ICD-10-CM | POA: Diagnosis not present

## 2021-11-05 DIAGNOSIS — M9906 Segmental and somatic dysfunction of lower extremity: Secondary | ICD-10-CM | POA: Diagnosis not present

## 2021-11-05 DIAGNOSIS — S29012A Strain of muscle and tendon of back wall of thorax, initial encounter: Secondary | ICD-10-CM | POA: Diagnosis not present

## 2021-11-05 DIAGNOSIS — M25551 Pain in right hip: Secondary | ICD-10-CM | POA: Diagnosis not present

## 2021-11-05 DIAGNOSIS — S338XXA Sprain of other parts of lumbar spine and pelvis, initial encounter: Secondary | ICD-10-CM | POA: Diagnosis not present

## 2021-11-05 DIAGNOSIS — M9903 Segmental and somatic dysfunction of lumbar region: Secondary | ICD-10-CM | POA: Diagnosis not present

## 2021-11-05 DIAGNOSIS — M9905 Segmental and somatic dysfunction of pelvic region: Secondary | ICD-10-CM | POA: Diagnosis not present

## 2021-11-05 DIAGNOSIS — M25552 Pain in left hip: Secondary | ICD-10-CM | POA: Diagnosis not present

## 2021-11-06 ENCOUNTER — Ambulatory Visit (INDEPENDENT_AMBULATORY_CARE_PROVIDER_SITE_OTHER): Payer: Medicare PPO

## 2021-11-06 ENCOUNTER — Encounter: Payer: Self-pay | Admitting: Physician Assistant

## 2021-11-06 ENCOUNTER — Ambulatory Visit: Payer: Medicare PPO | Admitting: Physician Assistant

## 2021-11-06 ENCOUNTER — Other Ambulatory Visit: Payer: Self-pay

## 2021-11-06 DIAGNOSIS — M545 Low back pain, unspecified: Secondary | ICD-10-CM

## 2021-11-06 DIAGNOSIS — M25551 Pain in right hip: Secondary | ICD-10-CM

## 2021-11-06 DIAGNOSIS — Z0000 Encounter for general adult medical examination without abnormal findings: Secondary | ICD-10-CM | POA: Diagnosis not present

## 2021-11-06 NOTE — Progress Notes (Signed)
Office Visit Note   Patient: Tina Aguilar           Date of Birth: [birth year]           MRN: [id number] Visit Date: 11/06/2021              Requested by: Vivi Barrack, MD [address] [place],  [place] [address] PCP: Vivi Barrack, MD  Chief Complaint  Patient presents with   Right Hip - Follow-up   Lower Back - Follow-up      HPI: Patient is a very pleasant 66 year old woman with a history of lower back pain with and right hip pain.  She has been seen by Dr. Durward Fortes in the past.  She has had x-rays of both her hip and lumbar spine.  Both showed some arthritic changes.  She does have significant arthritis especially in the right hip.  An MRI of her lumbar spine in 2019 did have findings consistent with some mild canal stenosis and facet arthropathy.  She actually said she was doing physical therapy last year and all of her symptoms both in her back and hip seem to improve.  She really did not have any pain.  Over the Christmas holiday she had the return of her pain.  She feels like most of her pain is in the right hip and groin.  She does get some cramping consistent with her previous history of stenosis in her thighs.  She is treated this with over-the-counter anti-inflammatories.  She has no loss of bowel or bladder control  Assessment & Plan: Visit Diagnoses:  1. Low back pain, unspecified back pain laterality, unspecified chronicity, unspecified whether sciatica present   2. Pain in right hip     Plan: 66 year old woman with a history of spinal stenosis and right hip arthritis which was significantly improved with therapy.  Return of her symptoms.  Based on my exam today I think the hip is the bigger issue.  We talked about options and treatment.  She understands the x-rays today do demonstrate some fairly advanced degenerative changes of the right hip.  On exam she is extremely stiff with rotation.  Since therapy helped her in the past we will try this.  I also think  she would benefit from an injection into her right hip.  We will arrange this with Dr. Ernestina Patches.  She will follow-up with Dr. Durward Fortes in a month for reevaluation.  Certainly we could also consider injection into her spine.  She understands her hip is bad enough that at some point she may get most relief from a hip replacement  Follow-Up Instructions: No follow-ups on file.   Ortho Exam  Patient is alert, oriented, no adenopathy, well-dressed, normal affect, normal respiratory effort. Examination patient has an antalgic gait on the right side.  She has no tenderness over her spine.  She has sensation grossly intact to her distal extremities.  She has good dorsiflexion plantarflexion and extension flexion of her of her hips.  She does have significant stiffness in her right hip with both internal and external rotation.  She does have some slight stiffness in her right hip but not nearly as much.  Imaging: XR HIP UNILAT W OR W/O PELVIS 2-3 VIEWS RIGHT  Result Date: 11/06/2021 Radiographs of the right hip demonstrate joint space narrowing sclerotic changes.  Femoral head is reduced well in the acetabulum.  She does have some osteophyte formation joint space narrowing  XR Lumbar Spine 2-3 Views  Result Date: 11/06/2021 Views of the lumbar spine were reviewed today.  She does have some listhesis level L4-5 facet arthropathy degenerative changes with loss of joint space at L2-3 and L1 to cannot appreciate any acute osseous injuries  No images are attached to the encounter.  Labs: Lab Results  Component Value Date   HGBA1C 5.7 07/26/2021   HGBA1C 5.4 07/20/2020   HGBA1C 5.7 04/17/2018   ESRSEDRATE 22 07/08/2019   ESRSEDRATE 11 05/30/2014   CRP <1.0 07/08/2019     Lab Results  Component Value Date   ALBUMIN 4.3 07/26/2021   ALBUMIN 4.1 08/09/2019   ALBUMIN 4.1 07/08/2019    Lab Results  Component Value Date   MG 1.8 03/08/2017   Lab Results  Component Value Date   VD25OH 34  07/20/2020   VD25OH 39.96 07/08/2019   VD25OH 43.47 04/17/2018    No results found for: PREALBUMIN CBC EXTENDED Latest Ref Rng & Units 07/26/2021 07/20/2020 03/12/2020  WBC 4.0 - 10.5 K/uL 5.1 6.2 7.2  RBC 3.87 - 5.11 Mil/uL 4.58 4.54 4.73  HGB 12.0 - 15.0 g/dL 13.6 13.9 14.3  HCT 36.0 - 46.0 % 41.9 41.7 43.6  PLT 150.0 - 400.0 K/uL 259.0 259 256  NEUTROABS 1.4 - 7.7 K/uL - - -  LYMPHSABS 0.7 - 4.0 K/uL - - -     There is no height or weight on file to calculate BMI.  Orders:  Orders Placed This Encounter  Procedures   XR Lumbar Spine 2-3 Views   XR HIP UNILAT W OR W/O PELVIS 2-3 VIEWS RIGHT   Ambulatory referral to Physical Medicine Rehab   Ambulatory referral to Physical Therapy   No orders of the defined types were placed in this encounter.    Procedures: No procedures performed  Clinical Data: No additional findings.  ROS:  All other systems negative, except as noted in the HPI. Review of Systems  All other systems reviewed and are negative.  Objective: Vital Signs: There were no vitals taken for this visit.  Specialty Comments:  No specialty comments available.  PMFS History: Patient Active Problem List   Diagnosis Date Noted   Persistent cough 09/27/2021   Post-nasal drip 09/27/2021   Osteoarthritis 03/17/2020   Vertigo 03/17/2020   Binge eating disorder 05/27/2017   Headache 05/27/2017   Colon polyps 08/30/2010   Hyperlipidemia 06/30/2007   Past Medical History:  Diagnosis Date   Arthritis    hips, hands   Chronic kidney disease    hx of kidney stones   Frequency of urination    History of adenomatous polyp of colon 08/28/2005   Hyperlipidemia, mixed    on meds   Left ureteral stone    Osteoarthritis    bilateral hips/lower back   Spinal stenosis    Vertigo    Wears glasses     Family History  Problem Relation Age of Onset   Coronary artery disease Mother    Arthritis Mother    Congestive Heart Failure Father    Heart attack  Father    Hyperlipidemia Sister    Diabetes Sister    Heart disease Sister    Hyperlipidemia Sister    Hyperlipidemia Sister    Other Brother        coronary artery bypass graft age 64   Hyperlipidemia Brother    Diabetes Brother    Heart attack Maternal Aunt        at age 37   Breast cancer Neg Hx  Colon polyps Neg Hx    Colon cancer Neg Hx    Esophageal cancer Neg Hx    Rectal cancer Neg Hx    Stomach cancer Neg Hx     Past Surgical History:  Procedure Laterality Date   CESAREAN SECTION  1991   and 1993   COLONOSCOPY  2012   JP-F/V-movi(exc)-normal   CYSTOSCOPY/URETEROSCOPY/HOLMIUM LASER/STENT PLACEMENT Left 05/06/2017   Procedure: CYSTOSCOPY/URETEROSCOPY/HOLMIUM LASER/STENT PLACEMENT;  Surgeon: Nickie Retort, MD;  Location: [facility];  Service: Urology;  Laterality: Left;   DILATION AND CURETTAGE OF UTERUS  1980   w/ Suction for miscarriage  x2   WISDOM TOOTH EXTRACTION  1976   Social History   Occupational History   Not on file  Tobacco Use   Smoking status: Former    Packs/day: 1.00    Years: 5.00    Pack years: 5.00    Types: Cigarettes    Quit date: 04/30/1977    Years since quitting: 44.5   Smokeless tobacco: Never  Vaping Use   Vaping Use: Never used  Substance and Sexual Activity   Alcohol use: No    Alcohol/week: 0.0 standard drinks   Drug use: No   Sexual activity: Not on file

## 2021-11-06 NOTE — Progress Notes (Addendum)
Virtual Visit via Telephone Note  I connected with  Tina Aguilar on 11/06/21 at  8:00 AM EST by telephone and verified that I am speaking with the correct person using two identifiers.  Medicare Annual Wellness visit completed telephonically due to Covid-19 pandemic.   Persons participating in this call: This Health Coach and this patient.   Location: Patient: home Provider: office   I discussed the limitations, risks, security and privacy concerns of performing an evaluation and management service by telephone and the availability of in person appointments. The patient expressed understanding and agreed to proceed.  Unable to perform video visit due to video visit attempted and failed and/or patient does not have video capability.   Some vital signs may be absent or patient reported.   Willette Brace, LPN   Subjective:   Tina Aguilar is a 66 y.o. female who presents for an Initial Medicare Annual Wellness Visit.  Review of Systems     Cardiac Risk Factors include: advanced age (>36men, >51 women);dyslipidemia     Objective:    Today's Vitals   11/06/21 0759  PainSc: 10-Worst pain ever   There is no height or weight on file to calculate BMI.  Advanced Directives 11/06/2021 03/12/2020 08/10/2018 05/06/2017 10/16/2016 08/16/2016  Does Patient Have a Medical Advance Directive? Yes No No Yes No Yes  Type of Advance Directive Living will - - [place] will  Does patient want to make changes to medical advance directive? - - - No - Patient declined - No - Patient declined  Copy of [place] in Chart? - - - No - copy requested - No - copy requested  Would patient like information on creating a medical advance directive? - - No - Patient declined - No - Patient declined -    Current Medications (verified) Outpatient Encounter Medications as of 11/06/2021  Medication Sig   ezetimibe-simvastatin (VYTORIN) 10-10 MG  tablet Take 1 tablet by mouth at bedtime.   Multiple Vitamin (MULTIVITAMIN) capsule Take 2 capsules by mouth daily.   Naproxen Sodium (ALEVE PO) Take 1 tablet by mouth daily as needed.   [DISCONTINUED] Ascorbic Acid (VITAMIN C PO) Take 2 tablets by mouth daily at 6 (six) AM.   [DISCONTINUED] VITAMIN D PO Take 2 tablets by mouth daily at 6 (six) AM.   [DISCONTINUED] fluticasone (FLONASE) 50 MCG/ACT nasal spray Place 1 spray into both nostrils daily. START with 1 spray each nare twice a day for 1 week, then decrease to daily use, can discontinue when symptoms resolve (Patient not taking: Reported on 11/06/2021)   No facility-administered encounter medications on file as of 11/06/2021.    Allergies (verified) Diclofenac   History: Past Medical History:  Diagnosis Date   Arthritis    hips, hands   Chronic kidney disease    hx of kidney stones   Frequency of urination    History of adenomatous polyp of colon 08/28/2005   Hyperlipidemia, mixed    on meds   Left ureteral stone    Osteoarthritis    bilateral hips/lower back   Spinal stenosis    Vertigo    Wears glasses    Past Surgical History:  Procedure Laterality Date   [place]   and 1993   COLONOSCOPY  2012   JP-F/V-movi(exc)-normal   CYSTOSCOPY/URETEROSCOPY/HOLMIUM LASER/STENT PLACEMENT Left 05/06/2017   Procedure: CYSTOSCOPY/URETEROSCOPY/HOLMIUM LASER/STENT PLACEMENT;  Surgeon: Nickie Retort, MD;  Location: [facility];  Service: Urology;  Laterality: Left;   DILATION AND CURETTAGE OF UTERUS  1980   w/ Suction for miscarriage  x2   WISDOM TOOTH EXTRACTION  1976   Family History  Problem Relation Age of Onset   Coronary artery disease Mother    Arthritis Mother    Congestive Heart Failure Father    Heart attack Father    Hyperlipidemia Sister    Diabetes Sister    Heart disease Sister    Hyperlipidemia Sister    Hyperlipidemia Sister    Other Brother        coronary artery bypass  graft age 54   Hyperlipidemia Brother    Diabetes Brother    Heart attack Maternal Aunt        at age 41   Breast cancer Neg Hx    Colon polyps Neg Hx    Colon cancer Neg Hx    Esophageal cancer Neg Hx    Rectal cancer Neg Hx    Stomach cancer Neg Hx    Social History   Socioeconomic History   Marital status: Married    Spouse name: Not on file   Number of children: Not on file   Years of education: Not on file   Highest education level: Not on file  Occupational History   Not on file  Tobacco Use   Smoking status: Former    Packs/day: 1.00    Years: 5.00    Pack years: 5.00    Types: Cigarettes    Quit date: 04/30/1977    Years since quitting: 44.5   Smokeless tobacco: Never  Vaping Use   Vaping Use: Never used  Substance and Sexual Activity   Alcohol use: No    Alcohol/week: 0.0 standard drinks   Drug use: No   Sexual activity: Not on file  Other Topics Concern   Not on file  Social History Narrative   [employer], [employer]   Married '84   Two grown daughters '91 '93   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   SO is in 26's and in good health, has RA   Social Determinants of Radio broadcast assistant Strain: Low Risk    Difficulty of Paying Living Expenses: Not hard at all  Food Insecurity: No Food Insecurity   Worried About Charity fundraiser in the Last Year: Never true   Arboriculturist in the Last Year: Never true  Transportation Needs: No Transportation Needs   Lack of Transportation (Medical): No   Lack of Transportation (Non-Medical): No  Physical Activity: Insufficiently Active   Days of Exercise per Week: 7 days   Minutes of Exercise per Session: 20 min  Stress: Stress Concern Present   Feeling of Stress : To some extent  Social Connections: Engineer, building services of Communication with Friends and Family: More than three times a week   Frequency of Social Gatherings with Friends and Family: More than three times a week    Attends Religious Services: More than 4 times per year   Active Member of [employer] or Organizations: Yes   Attends Archivist Meetings: 1 to 4 times per year   Marital Status: Married    Tobacco Counseling Counseling given: Not Answered   Clinical Intake:  Pre-visit preparation completed: Yes  Pain : 0-10 Pain Score: 10-Worst pain ever (when standing going into sit) Pain Type: Chronic pain Pain Location: Hip Pain Orientation: Right Pain Descriptors / Indicators:  Sharp, Aching, Shooting Pain Onset: More than a month ago Pain Frequency: Intermittent     BMI - recorded: 28.59 Nutritional Status: BMI 25 -29 Overweight Nutritional Risks: None Diabetes: No  How often do you need to have someone help you when you read instructions, pamphlets, or other written materials from your doctor or pharmacy?: 1 - Never  Diabetic?No  Interpreter Needed?: No  Information entered by :: Charlott Rakes, LPN   Activities of Daily Living In your present state of health, do you have any difficulty performing the following activities: 11/06/2021 07/06/2021  Hearing? N N  Vision? N N  Difficulty concentrating or making decisions? N N  Walking or climbing stairs? Y N  Comment at times with hip and back -  Dressing or bathing? N N  Doing errands, shopping? N N  Preparing Food and eating ? N -  Using the Toilet? N -  In the past six months, have you accidently leaked urine? N -  Do you have problems with loss of bowel control? N -  Managing your Medications? N -  Managing your Finances? N -  Housekeeping or managing your Housekeeping? N -  Some recent data might be hidden    Patient Care Team: Vivi Barrack, MD as PCP - General (Family Medicine) Garald Balding, MD as Consulting Physician (Orthopedic Surgery)  Indicate any recent Medical Services you may have received from other than [facility] providers in the past year (date may be approximate).     Assessment:   This is a  routine wellness examination for Tina Aguilar.  Hearing/Vision screen Hearing Screening - Comments:: Pt denies any hearing issues  Vision Screening - Comments:: Pt follows up with [facility] for annual eye exams   Dietary issues and exercise activities discussed: Current Exercise Habits: Home exercise routine, Type of exercise: stretching;Other - see comments;walking, Time (Minutes): 20, Frequency (Times/Week): 7, Weekly Exercise (Minutes/Week): 140   Goals Addressed             This Visit's Progress    Patient Stated       Lose weight        Depression Screen PHQ 2/9 Scores 11/06/2021 07/26/2021 07/06/2021 07/07/2019 05/27/2017 10/16/2016 05/25/2014  PHQ - 2 Score 0 0 0 0 0 0 0  PHQ- 9 Score - - - 3 - - -    Fall Risk Fall Risk  11/06/2021 07/26/2021 05/27/2017 10/16/2016 08/16/2016  Falls in the past year? 0 0 No No Yes  Number falls in past yr: 0 0 - - 1  Injury with Fall? 0 0 - - No  Risk for fall due to : Impaired vision;Impaired balance/gait;Impaired mobility - - - -  Risk for fall due to: Comment due to the hip and back pain - - - -    FALL RISK PREVENTION PERTAINING TO THE HOME:  Any stairs in or around the home? No  If so, are there any without handrails? No  Home free of loose throw rugs in walkways, pet beds, electrical cords, etc? Yes  Adequate lighting in your home to reduce risk of falls? Yes   ASSISTIVE DEVICES UTILIZED TO PREVENT FALLS:  Life alert? No  Use of a cane, walker or w/c? No  Grab bars in the bathroom? Yes  Shower chair or bench in shower? Yes  Elevated toilet seat or a handicapped toilet? Yes   TIMED UP AND GO:  Was the test performed? No .   Cognitive Function:  6CIT Screen 11/06/2021  What Year? 0 points  What month? 0 points  What time? 0 points  Count back from 20 0 points  Months in reverse 0 points  Repeat phrase 0 points  Total Score 0    Immunizations Immunization History  Administered Date(s) Administered   PFIZER(Purple  Top)SARS-COV-2 Vaccination 12/27/2019, 01/19/2020, 08/13/2020, 04/30/2021   Pneumococcal Conjugate-13 07/20/2020   Pneumococcal Polysaccharide-23 07/26/2021   Tdap 05/25/2014   Zoster Recombinat (Shingrix) 05/27/2017, 11/13/2017    TDAP status: Up to date  Flu Vaccine status: Declined, Education has been provided regarding the importance of this vaccine but patient still declined. Advised may receive this vaccine at local pharmacy or Health Dept. Aware to provide a copy of the vaccination record if obtained from local pharmacy or Health Dept. Verbalized acceptance and understanding.  Pneumococcal vaccine status: Up to date  Covid-19 vaccine status: Completed vaccines  Qualifies for Shingles Vaccine? Yes   Zostavax completed Yes   Shingrix Completed?: Yes  Screening Tests Health Maintenance  Topic Date Due   COVID-19 Vaccine (5 - Booster for Pfizer series) 06/25/2021   MAMMOGRAM  06/22/2023   TETANUS/TDAP  05/25/2024   COLONOSCOPY (Pts 45-22yrs Insurance coverage will need to be confirmed)  06/28/2031   Pneumonia Vaccine 10+ Years old  Completed   DEXA SCAN  Completed   Hepatitis C Screening  Completed   Zoster Vaccines- Shingrix  Completed   HPV VACCINES  Aged Out   INFLUENZA VACCINE  Discontinued    Health Maintenance  Health Maintenance Due  Topic Date Due   COVID-19 Vaccine (5 - Booster for [place] series) 06/25/2021    Colorectal cancer screening: Type of screening: Colonoscopy. Completed 06/27/21. Repeat every 10 years  Mammogram status: Completed 06/21/21. Repeat every year  Bone Density status: Completed 07/26/20. Results reflect: Bone density results: OSTEOPENIA. Repeat every 2 years.  Additional Screening:  Hepatitis C Screening:  Completed 04/17/18  Vision Screening: Recommended annual ophthalmology exams for early detection of glaucoma and other disorders of the eye. Is the patient up to date with their annual eye exam?  Yes  Who is the provider or what is  the name of the office in which the patient attends annual eye exams? [facility] If pt is not established with a provider, would they like to be referred to a provider to establish care? No .   Dental Screening: Recommended annual dental exams for proper oral hygiene  Community Resource Referral / Chronic Care Management: CRR required this visit?  No   CCM required this visit?  No      Plan:     I have personally reviewed and noted the following in the patients chart:   Medical and social history Use of alcohol, tobacco or illicit drugs  Current medications and supplements including opioid prescriptions. Patient is not currently taking opioid prescriptions. Functional ability and status Nutritional status Physical activity Advanced directives List of other physicians Hospitalizations, surgeries, and ER visits in previous 12 months Vitals Screenings to include cognitive, depression, and falls Referrals and appointments  In addition, I have reviewed and discussed with patient certain preventive protocols, quality metrics, and best practice recommendations. A written personalized care plan for preventive services as well as general preventive health recommendations were provided to patient.     Willette Brace, LPN   9/32/3557   Nurse Notes: None

## 2021-11-06 NOTE — Patient Instructions (Signed)
Tina Aguilar , Thank you for taking time to come for your Medicare Wellness Visit. I appreciate your ongoing commitment to your health goals. Please review the following plan we discussed and let me know if I can assist you in the future.   Screening recommendations/referrals: Colonoscopy: Done 06/27/21 repeat every 10 years  Mammogram: Done 06/21/21 repeat every year Bone Density: Done 07/26/20 repeat every 2 years  Recommended yearly ophthalmology/optometry visit for glaucoma screening and checkup Recommended yearly dental visit for hygiene and checkup  Vaccinations: Influenza vaccine: Discontinued Pneumococcal vaccine: Up to date Tdap vaccine: Done 05/25/14 repeat every 10 years  Shingles vaccine: Completed 05/27/17 & 11/13/17   Covid-19:Completed 3/15, 4/7, 08/13/20 & 04/30/21  Advanced directives: Please bring a copy of your health care power of attorney and living will to the office at your convenience.  Conditions/risks identified: Lose weight   Next appointment: Follow up in one year for your annual wellness visit    Preventive Care 65 Years and Older, Female Preventive care refers to lifestyle choices and visits with your health care provider that can promote health and wellness. What does preventive care include? A yearly physical exam. This is also called an annual well check. Dental exams once or twice a year. Routine eye exams. Ask your health care provider how often you should have your eyes checked. Personal lifestyle choices, including: Daily care of your teeth and gums. Regular physical activity. Eating a healthy diet. Avoiding tobacco and drug use. Limiting alcohol use. Practicing safe sex. Taking low-dose aspirin every day. Taking vitamin and mineral supplements as recommended by your health care provider. What happens during an annual well check? The services and screenings done by your health care provider during your annual well check will depend on your age,  overall health, lifestyle risk factors, and family history of disease. Counseling  Your health care provider may ask you questions about your: Alcohol use. Tobacco use. Drug use. Emotional well-being. Home and relationship well-being. Sexual activity. Eating habits. History of falls. Memory and ability to understand (cognition). Work and work Statistician. Reproductive health. Screening  You may have the following tests or measurements: Height, weight, and BMI. Blood pressure. Lipid and cholesterol levels. These may be checked every 5 years, or more frequently if you are over 65 years old. Skin check. Lung cancer screening. You may have this screening every year starting at age 53 if you have a 30-pack-year history of smoking and currently smoke or have quit within the past 15 years. Fecal occult blood test (FOBT) of the stool. You may have this test every year starting at age 32. Flexible sigmoidoscopy or colonoscopy. You may have a sigmoidoscopy every 5 years or a colonoscopy every 10 years starting at age 60. Hepatitis C blood test. Hepatitis B blood test. Sexually transmitted disease (STD) testing. Diabetes screening. This is done by checking your blood sugar (glucose) after you have not eaten for a while (fasting). You may have this done every 1-3 years. Bone density scan. This is done to screen for osteoporosis. You may have this done starting at age 57. Mammogram. This may be done every 1-2 years. Talk to your health care provider about how often you should have regular mammograms. Talk with your health care provider about your test results, treatment options, and if necessary, the need for more tests. Vaccines  Your health care provider may recommend certain vaccines, such as: Influenza vaccine. This is recommended every year. Tetanus, diphtheria, and acellular pertussis (Tdap, Td) vaccine.  You may need a Td booster every 10 years. Zoster vaccine. You may need this after age  75. Pneumococcal 13-valent conjugate (PCV13) vaccine. One dose is recommended after age 50. Pneumococcal polysaccharide (PPSV23) vaccine. One dose is recommended after age 62. Talk to your health care provider about which screenings and vaccines you need and how often you need them. This information is not intended to replace advice given to you by your health care provider. Make sure you discuss any questions you have with your health care provider. Document Released: 10/27/2015 Document Revised: 06/19/2016 Document Reviewed: 08/01/2015 Elsevier Interactive Patient Education  2017 [employer] Prevention in the Home Falls can cause injuries. They can happen to people of all ages. There are many things you can do to make your home safe and to help prevent falls. What can I do on the outside of my home? Regularly fix the edges of walkways and driveways and fix any cracks. Remove anything that might make you trip as you walk through a door, such as a raised step or threshold. Trim any bushes or trees on the path to your home. Use bright outdoor lighting. Clear any walking paths of anything that might make someone trip, such as rocks or tools. Regularly check to see if handrails are loose or broken. Make sure that both sides of any steps have handrails. Any raised decks and porches should have guardrails on the edges. Have any leaves, snow, or ice cleared regularly. Use sand or salt on walking paths during winter. Clean up any spills in your garage right away. This includes oil or grease spills. What can I do in the bathroom? Use night lights. Install grab bars by the toilet and in the tub and shower. Do not use towel bars as grab bars. Use non-skid mats or decals in the tub or shower. If you need to sit down in the shower, use a plastic, non-slip stool. Keep the floor dry. Clean up any water that spills on the floor as soon as it happens. Remove soap buildup in the tub or shower  regularly. Attach bath mats securely with double-sided non-slip rug tape. Do not have throw rugs and other things on the floor that can make you trip. What can I do in the bedroom? Use night lights. Make sure that you have a light by your bed that is easy to reach. Do not use any sheets or blankets that are too big for your bed. They should not hang down onto the floor. Have a firm chair that has side arms. You can use this for support while you get dressed. Do not have throw rugs and other things on the floor that can make you trip. What can I do in the kitchen? Clean up any spills right away. Avoid walking on wet floors. Keep items that you use a lot in easy-to-reach places. If you need to reach something above you, use a strong step stool that has a grab bar. Keep electrical cords out of the way. Do not use floor polish or wax that makes floors slippery. If you must use wax, use non-skid floor wax. Do not have throw rugs and other things on the floor that can make you trip. What can I do with my stairs? Do not leave any items on the stairs. Make sure that there are handrails on both sides of the stairs and use them. Fix handrails that are broken or loose. Make sure that handrails are as long as  the stairways. Check any carpeting to make sure that it is firmly attached to the stairs. Fix any carpet that is loose or worn. Avoid having throw rugs at the top or bottom of the stairs. If you do have throw rugs, attach them to the floor with carpet tape. Make sure that you have a light switch at the top of the stairs and the bottom of the stairs. If you do not have them, ask someone to add them for you. What else can I do to help prevent falls? Wear shoes that: Do not have high heels. Have rubber bottoms. Are comfortable and fit you well. Are closed at the toe. Do not wear sandals. If you use a stepladder: Make sure that it is fully opened. Do not climb a closed stepladder. Make sure that  both sides of the stepladder are locked into place. Ask someone to hold it for you, if possible. Clearly mark and make sure that you can see: Any grab bars or handrails. First and last steps. Where the edge of each step is. Use tools that help you move around (mobility aids) if they are needed. These include: Canes. Walkers. Scooters. Crutches. Turn on the lights when you go into a dark area. Replace any light bulbs as soon as they burn out. Set up your furniture so you have a clear path. Avoid moving your furniture around. If any of your floors are uneven, fix them. If there are any pets around you, be aware of where they are. Review your medicines with your doctor. Some medicines can make you feel dizzy. This can increase your chance of falling. Ask your doctor what other things that you can do to help prevent falls. This information is not intended to replace advice given to you by your health care provider. Make sure you discuss any questions you have with your health care provider. Document Released: 07/27/2009 Document Revised: 03/07/2016 Document Reviewed: 11/04/2014 Elsevier Interactive Patient Education  2017 [employer].

## 2021-11-07 ENCOUNTER — Telehealth: Payer: Self-pay | Admitting: Physical Medicine and Rehabilitation

## 2021-11-07 NOTE — Telephone Encounter (Signed)
Pt called requesting a call back about appt for hip injection. Please call pt at [phone].

## 2021-11-19 ENCOUNTER — Encounter: Payer: Self-pay | Admitting: Physical Medicine and Rehabilitation

## 2021-11-19 ENCOUNTER — Ambulatory Visit: Payer: Self-pay

## 2021-11-19 ENCOUNTER — Ambulatory Visit (INDEPENDENT_AMBULATORY_CARE_PROVIDER_SITE_OTHER): Payer: Medicare PPO | Admitting: Physical Medicine and Rehabilitation

## 2021-11-19 ENCOUNTER — Other Ambulatory Visit: Payer: Self-pay

## 2021-11-19 DIAGNOSIS — M25551 Pain in right hip: Secondary | ICD-10-CM

## 2021-11-19 MED ORDER — TRIAMCINOLONE ACETONIDE 40 MG/ML IJ SUSP
60.0000 mg | INTRAMUSCULAR | Status: AC | PRN
  Administered 2021-11-19: 60 mg via INTRA_ARTICULAR

## 2021-11-19 MED ORDER — BUPIVACAINE HCL 0.25 % IJ SOLN
4.0000 mL | INTRAMUSCULAR | Status: AC | PRN
  Administered 2021-11-19: 4 mL via INTRA_ARTICULAR

## 2021-11-19 NOTE — Progress Notes (Signed)
° °  Tina Aguilar - 66 y.o. female MRN [id number]  Date of birth: May 27, 1956  Office Visit Note: Visit Date: 11/19/2021 PCP: Vivi Barrack, MD Referred by: Vivi Barrack, MD  Subjective: Chief Complaint  Patient presents with   Right Hip - Pain   HPI:  Tina Aguilar is a 66 y.o. female who comes in today at the request of [place], PA-C for planned Right anesthetic hip arthrogram with fluoroscopic guidance.  The patient has failed conservative care including home exercise, medications, time and activity modification.  This injection will be diagnostic and hopefully therapeutic.  Please see requesting physician notes for further details and justification.  ROS Otherwise per HPI.  Assessment & Plan: Visit Diagnoses:    ICD-10-CM   1. Pain in right hip  M25.551 XR C-ARM NO REPORT    Large Joint Inj: R hip joint      Plan: No additional findings.   Meds & Orders: No orders of the defined types were placed in this encounter.   Orders Placed This Encounter  Procedures   Large Joint Inj: R hip joint   XR C-ARM NO REPORT    Follow-up: Return for Joni Fears, MD as scheduled.   Procedures: Large Joint Inj: R hip joint on 11/19/2021 8:26 AM Indications: diagnostic evaluation and pain Details: 22 G 3.5 in needle, fluoroscopy-guided anterior approach  Arthrogram: No  Medications: 4 mL bupivacaine 0.25 %; 60 mg triamcinolone acetonide 40 MG/ML Outcome: tolerated well, no immediate complications  There was excellent flow of contrast producing a partial arthrogram of the hip. The patient did have relief of symptoms during the anesthetic phase of the injection. Procedure, treatment alternatives, risks and benefits explained, specific risks discussed. Consent was given by the patient. Immediately prior to procedure a time out was called to verify the correct patient, procedure, equipment, support staff and site/side marked as required. Patient was prepped  and draped in the usual sterile fashion.         Clinical History: No specialty comments available.     Objective:  VS:  HT:     WT:    BMI:      BP:    HR: bpm   TEMP: ( )   RESP:  Physical Exam   Imaging: XR C-ARM NO REPORT  Result Date: 11/19/2021 Please see Notes tab for imaging impression.

## 2021-11-19 NOTE — Progress Notes (Signed)
Pt state right hip pain. Pt state standing and sitting make the pain worse. Pt state she take sover the ocunter pain meds and uses heat tohelp ease her pain.  Numeric Pain Rating Scale and Functional Assessment Average Pain 3   In the last MONTH (on 0-10 scale) has pain interfered with the following?  1. General activity like being  able to carry out your everyday physical activities such as walking, climbing stairs, carrying groceries, or moving a chair?  Rating(9)   -BT, -Dye Allergies.

## 2021-12-12 ENCOUNTER — Ambulatory Visit (INDEPENDENT_AMBULATORY_CARE_PROVIDER_SITE_OTHER): Payer: Medicare PPO | Admitting: Orthopaedic Surgery

## 2021-12-12 ENCOUNTER — Encounter: Payer: Self-pay | Admitting: Orthopaedic Surgery

## 2021-12-12 ENCOUNTER — Other Ambulatory Visit: Payer: Self-pay

## 2021-12-12 DIAGNOSIS — M25551 Pain in right hip: Secondary | ICD-10-CM

## 2021-12-12 NOTE — Progress Notes (Addendum)
? ?Office Visit Note ?  ?Patient: Tina Aguilar           ?Date of Birth: [birth year]           ?MRN: [id number] ?Visit Date: 12/12/2021 ?             ?Requested by: Vivi Barrack, MD ?[address] ?[place],  [place] [id number] ?PCP: Vivi Barrack, MD ? ? ?Assessment & Plan: ?Visit Diagnoses: Right Hip Pain ? ?Plan: Patient is a pleasant 66 year old woman with a history of advanced osteoarthritis of her right hip.  She did have an injection with Dr. Ernestina Patches almost 4 weeks ago into her hip and reports that she is 95% better and still continues to have good relief.  She does have some stiffness in her hip but other than that she is doing better.  Based on her x-rays she has advanced osteoarthritis with subchondral cysts in the femoral head calcification of the acetabular area sclerotic and loss of joint space.  She may continue to have injections as needed she knows if these become a frequent need less than 3 months she might consider hip replacement if she has difficulties with pain and function.  We had a discussion with her with regards to hip replacement recovery procedure.  We would refer her to Dr. Ninfa Linden if she decides to do this.  Otherwise she may follow-up with Dr. Ernestina Patches for further injections ? ?Follow-Up Instructions: No follow-ups on file.  ? ?Orders:  ?No orders of the defined types were placed in this encounter. ? ?No orders of the defined types were placed in this encounter. ? ? ? ? Procedures: ?No procedures performed ? ? ?Clinical Data: ?No additional findings. ? ? ?Subjective: ?Chief Complaint  ?Patient presents with  ? Lower Back - Follow-up  ? Right Hip - Follow-up  ?Patient presents today for a one month follow up on her lower back and right hip. She received a right hip injection on 11/19/2021 with Dr.Newton. She said that the injection really helped. She has no shooting pains down her leg now. She does still have aching in her lower back and buttock area. She takes over the counter  medicine if needed, but overall is much improved. ? ?HPI ? ?Review of Systems  ?All other systems reviewed and are negative. ? ? ?Objective: ?Vital Signs: There were no vitals taken for this visit. ? ?Physical Exam ?Constitutional:   ?   Appearance: Normal appearance.  ?Pulmonary:  ?   Effort: Pulmonary effort is normal.  ?Neurological:  ?   General: No focal deficit present.  ?   Mental Status: She is alert.  ? ? ?Ortho Exam ?Examination of her right hip she does have very limited range of motion but decreased pain today.  Distal CMS is intact she does have a slightly antalgic gait good strength with dorsiflexion plantarflexion ?Specialty Comments:  ?No specialty comments available. ? ?Imaging: ?No results found. ? ? ?PMFS History: ?Patient Active Problem List  ? Diagnosis Date Noted  ? Persistent cough 09/27/2021  ? Post-nasal drip 09/27/2021  ? Osteoarthritis 03/17/2020  ? Vertigo 03/17/2020  ? Binge eating disorder 05/27/2017  ? Headache 05/27/2017  ? Colon polyps 08/30/2010  ? Hyperlipidemia 06/30/2007  ? ?Past Medical History:  ?Diagnosis Date  ? Arthritis   ? hips, hands  ? Chronic kidney disease   ? hx of kidney stones  ? Frequency of urination   ? History of adenomatous polyp of colon 08/28/2005  ?  Hyperlipidemia, mixed   ? on meds  ? Left ureteral stone   ? Osteoarthritis   ? bilateral hips/lower back  ? Spinal stenosis   ? Vertigo   ? Wears glasses   ?  ?Family History  ?Problem Relation Age of Onset  ? Coronary artery disease Mother   ? Arthritis Mother   ? Congestive Heart Failure Father   ? Heart attack Father   ? Hyperlipidemia Sister   ? Diabetes Sister   ? Heart disease Sister   ? Hyperlipidemia Sister   ? Hyperlipidemia Sister   ? Other Brother   ?     coronary artery bypass graft age 23  ? Hyperlipidemia Brother   ? Diabetes Brother   ? Heart attack Maternal Aunt   ?     at age 49  ? Breast cancer Neg Hx   ? Colon polyps Neg Hx   ? Colon cancer Neg Hx   ? Esophageal cancer Neg Hx   ? Rectal cancer  Neg Hx   ? Stomach cancer Neg Hx   ?  ?Past Surgical History:  ?Procedure Laterality Date  ? [place]  ? and 1993  ? COLONOSCOPY  2012  ? JP-F/V-movi(exc)-normal  ? CYSTOSCOPY/URETEROSCOPY/HOLMIUM LASER/STENT PLACEMENT Left 05/06/2017  ? Procedure: CYSTOSCOPY/URETEROSCOPY/HOLMIUM LASER/STENT PLACEMENT;  Surgeon: Nickie Retort, MD;  Location: [facility];  Service: Urology;  Laterality: Left;  ? DILATION AND CURETTAGE OF UTERUS  1980  ? w/ Suction for miscarriage  x2  ? [place] EXTRACTION  1976  ? ?Social History  ? ?Occupational History  ? Not on file  ?Tobacco Use  ? Smoking status: Former  ?  Packs/day: 1.00  ?  Years: 5.00  ?  Pack years: 5.00  ?  Types: Cigarettes  ?  Quit date: 04/30/1977  ?  Years since quitting: 44.6  ? Smokeless tobacco: Never  ?Vaping Use  ? Vaping Use: Never used  ?Substance and Sexual Activity  ? Alcohol use: No  ?  Alcohol/week: 0.0 standard drinks  ? Drug use: No  ? Sexual activity: Not on file  ? ? ? ? ? ? ?

## 2021-12-19 ENCOUNTER — Telehealth: Payer: Self-pay | Admitting: Orthopaedic Surgery

## 2021-12-19 ENCOUNTER — Other Ambulatory Visit: Payer: Self-pay

## 2021-12-19 DIAGNOSIS — M545 Low back pain, unspecified: Secondary | ICD-10-CM

## 2021-12-19 NOTE — Telephone Encounter (Signed)
Called. No answer. Left message that referral for Tina Aguilar has been placed.  ?

## 2021-12-19 NOTE — Telephone Encounter (Signed)
Patient called. She would like a referral to see Dr. Ernestina Patches for her back. Her call back number is [phone] ?

## 2021-12-19 NOTE — Telephone Encounter (Signed)
Ok for referral?

## 2021-12-20 ENCOUNTER — Telehealth: Payer: Self-pay | Admitting: Physician Assistant

## 2021-12-20 ENCOUNTER — Other Ambulatory Visit: Payer: Self-pay

## 2021-12-20 NOTE — Telephone Encounter (Signed)
Pt called requesting a call from [place]. Pt states she has questions about an injection. Please call pt at [phone]. ?

## 2021-12-24 ENCOUNTER — Telehealth: Payer: Self-pay | Admitting: Physical Medicine and Rehabilitation

## 2021-12-24 NOTE — Telephone Encounter (Signed)
Pt returned call to [facility] for referral. Please call pt at [phone]. ?

## 2022-01-01 ENCOUNTER — Encounter: Payer: Self-pay | Admitting: Physical Medicine and Rehabilitation

## 2022-01-01 ENCOUNTER — Other Ambulatory Visit: Payer: Self-pay

## 2022-01-01 ENCOUNTER — Ambulatory Visit (INDEPENDENT_AMBULATORY_CARE_PROVIDER_SITE_OTHER): Payer: Medicare PPO | Admitting: Physical Medicine and Rehabilitation

## 2022-01-01 VITALS — BP 137/87 | HR 69

## 2022-01-01 DIAGNOSIS — G8929 Other chronic pain: Secondary | ICD-10-CM

## 2022-01-01 DIAGNOSIS — M48061 Spinal stenosis, lumbar region without neurogenic claudication: Secondary | ICD-10-CM

## 2022-01-01 DIAGNOSIS — M545 Low back pain, unspecified: Secondary | ICD-10-CM | POA: Diagnosis not present

## 2022-01-01 DIAGNOSIS — M47816 Spondylosis without myelopathy or radiculopathy, lumbar region: Secondary | ICD-10-CM

## 2022-01-01 NOTE — Progress Notes (Signed)
Pt states has bilateral leg cramping. Just had injection in right hip two months ago. Denies numbness and tingling. States that LB gets stiff. ? ?Numeric Pain Rating Scale and Functional Assessment ?Average Pain 4 ?Pain Right Now 4 ?My pain is dull and aching ?Pain is worse with: sitting and standing and activities  ?Pain improves with:  moving ? ? ?In the last MONTH (on 0-10 scale) has pain interfered with the following? ? ?1. General activity like being  able to carry out your everyday physical activities such as walking, climbing stairs, carrying groceries, or moving a chair?  ?Rating(4) ? ?2. Relation with others like being able to carry out your usual social activities and roles such as  activities at home, at work and in your community. ?Rating(5) ? ?3. Enjoyment of life such that you have  been bothered by emotional problems such as feeling anxious, depressed or irritable?  ?Rating(5) ? ? ?

## 2022-01-01 NOTE — Progress Notes (Signed)
Tina Aguilar - 66 y.o. female MRN [id number]  Date of birth: 1955-10-18  Office Visit Note: Visit Date: 01/01/2022 PCP: Ardith Dark, MD Referred by: Ardith Dark, MD  Subjective: Chief Complaint  Patient presents with   Lower Back - Pain   HPI: Tina Aguilar is a 66 y.o. female who comes in today at the request of Dr. Norlene Campbell for evaluation of chronic, worsening and severe bilateral lower back pain radiating to buttocks, right greater than left.  Patient reports pain has been ongoing for several months and is exacerbated by moving from a sitting to standing position, standing and activity.  She describes her pain as a constant sore and stiff sensation, currently rates as 8 out of 10.  Patient reports some relief of pain with home exercise regimen, rest and use of over-the-counter medications as needed.  Patient also reports recent massage therapy at [facility] and reports good relief of pain with these treatments.  Patient's lumbar MRI from 2019 exhibits mild to moderate spinal canal stenosis noted at L3-L4, grade 1 anterolisthesis and severe facet hypertrophy at L4-L5, moderate to severe facet hypertrophy noted at L5-S1. No high grade spinal canal stenosis noted. Patient states her severe lower back pain makes it difficult to attend choir practice and participate in church activities. Patient states her pain is most severe when waking up in the mornings. Patient denies focal weakness, numbness and tingling. Patient denies recent trauma or falls.   Patient was recently evaluated by [place] Persons, PA for chronic right groin/hip pain. Recent x-ray images of right hip do exhibit osteophyte formation and joint space narrowing. Patient had right intra-articular hip injection performed in our office on 11/19/2021 and reports greater than 95% relief of pain with this procedure. Patient states she is now able to walk without significant pain.   Review of Systems   Musculoskeletal:  Positive for back pain.  Neurological:  Negative for tingling, sensory change, focal weakness and weakness.  All other systems reviewed and are negative. Otherwise per HPI.  Assessment & Plan: Visit Diagnoses:    ICD-10-CM   1. Chronic bilateral low back pain without sciatica  M54.50 Ambulatory referral to Physical Medicine Rehab   G89.29     2. Spondylosis without myelopathy or radiculopathy, lumbar region  M47.816 Ambulatory referral to Physical Medicine Rehab    3. Facet hypertrophy of lumbar region  M47.816 Ambulatory referral to Physical Medicine Rehab    4. Spinal stenosis of lumbar region without neurogenic claudication  M48.061        Plan: Findings:  1. Chronic, worsening and severe bilateral lower back pain radiating to buttocks, right greater than left.  Patient continues to have excruciating debilitating pain despite good conservative therapy such as home exercise regimen, massage therapy, rest and use of over-the-counter medications as needed.  Patient's clinical presentation and exam are consistent with facet mediated pain.  She does have significant pain with lumbar extension on exam.  We believe the neck step is to perform diagnostic and hopefully therapeutic bilateral L4-L5 and L5-S1 facet joint/medial branch blocks under fluoroscopic guidance.  If patient gets good relief with facet joint/medial branch blocks we did discuss the possibility of longer sustained pain relief with radiofrequency ablation procedure.  I did provide the patient with educational material regarding radiofrequency ablation procedure to take home and review.  Patient is agreeable with plan and has no further questions at this time.  No red flag symptoms noted upon exam  today.  2.  Chronic right-sided groin/hip pain.  Greater than 95% relief with recent right intra-articular hip injection performed in our office. Patients functional ability has increased and she is now able to walk  without significant pain. Patient instructed to let us know if pain returns or worsens, we are happy to repeat injection if warranted.    Meds & Orders: No orders of the defined types were placed in this encounter.   Orders Placed This Encounter  Procedures   Ambulatory referral to Physical Medicine Rehab    Follow-up: Return for Bilateral L4-L5 and L5-S1 facet joint/medial branch blocks.   Procedures: No procedures performed      Clinical History: EXAM: MRI LUMBAR SPINE WITHOUT CONTRAST   TECHNIQUE: Multiplanar, multisequence MR imaging of the lumbar spine was performed. No intravenous contrast was administered.   COMPARISON:  Lumbar radiographs 06/22/2018. [facility] CT Abdomen and Pelvis 03/09/2017.   FINDINGS: Segmentation:  Normal on the comparisons.   Alignment: Stable since 2018. Grade 1 anterolisthesis of L4 on L5 measuring 5-6 millimeters. Subtle retrolisthesis of L1 on L2. Preserved lumbar lordosis overall.   Vertebrae: Mild degenerative appearing endplate marrow edema at the L1-L2 endplates, to a lesser extent T12-L1 endplates. Minimal similar L5 superior endplate edema, and also possible minimal edema in the left L4 and L5 facets.   Background bone marrow signal is normal. No suspicious osseous lesion. Intact visible sacrum and SI joints.   Conus medullaris and cauda equina: Conus extends to the T12-L1 level. No lower spinal cord or conus signal abnormality.   Paraspinal and other soft tissues: Within normal limits; the left ureter in now appears decompressed compared to the 2018 CT. Negative visualized posterior paraspinal soft tissues.   Disc levels:   T10-T11: Negative.   T11-T12: Mild to moderate facet hypertrophy greater on the left. Mild to moderate left T11 foraminal stenosis.   T12-L1: Minor circumferential disc bulge. Mild facet and ligament flavum hypertrophy. Borderline to mild bilateral T12 foraminal stenosis.   L1-L2: Mild  circumferential disc bulge. Mild facet hypertrophy. Borderline to mild left L1 foraminal stenosis.   L2-L3: Mild circumferential disc bulge. Mild facet hypertrophy. Borderline to mild spinal stenosis (series 5, image 24).   L3-L4: Mild circumferential disc bulge. Mild to moderate facet and ligament flavum hypertrophy. Mild to moderate spinal stenosis (series 5, image 29) with left greater than right lateral recess stenosis (L4 nerve levels). Mild right L3 foraminal stenosis.   L4-L5: Grade 1 anterolisthesis with disc space loss and mild circumferential disc bulge/pseudo disc. Severe bilateral facet hypertrophy. Mild to moderate ligament flavum hypertrophy. Borderline to mild spinal and bilateral lateral recess stenosis (L5 nerve levels). Borderline to mild left L4 foraminal stenosis.   L5-S1: Negative disc. Moderate to severe facet hypertrophy greater on the left. No stenosis.   IMPRESSION: 1. Up to moderate multifactorial spinal stenosis at L3-L4, and up to mild stenosis at L2-L3 and L4-L5. Disc bulging at these levels with posterior element hypertrophy which is severe at L4-L5 - due to chronic grade 1 anterolisthesis. 2. Up to severe facet degeneration also at L5-S1, but no associated stenosis at that level. 3. Similar lower thoracic disc bulging and intermittent facet hypertrophy. Up to moderate left T11 foraminal stenosis. 4. Minimal to mild degenerative appearing marrow edema at several levels, including the L1-L2 endplates, left L4-L5 facets.     Electronically Signed   By: Odessa Fleming M.D.   On: 07/06/2018 08:15   She reports that she quit smoking  about 44 years ago. Her smoking use included cigarettes. She has a 5.00 pack-year smoking history. She has never used smokeless tobacco.  Recent Labs    07/26/21 1006  HGBA1C 5.7    Objective:  VS:  HT:    WT:   BMI:     BP:137/87  HR:69bpm  TEMP: ( )  RESP:  Physical Exam Vitals and nursing note reviewed.  HENT:      Head: Normocephalic and atraumatic.     Right Ear: External ear normal.     Left Ear: External ear normal.     Nose: Nose normal.     Mouth/Throat:     Mouth: Mucous membranes are moist.  Eyes:     Extraocular Movements: Extraocular movements intact.  Cardiovascular:     Rate and Rhythm: Normal rate.     Pulses: Normal pulses.  Pulmonary:     Effort: Pulmonary effort is normal.  Abdominal:     General: Abdomen is flat. There is no distension.  Musculoskeletal:        General: Tenderness present.     Cervical back: Normal range of motion.     Comments: Pt is slow to rise from seated position to standing. Concordant low back pain with facet loading, lumbar spine extension and rotation. Strong distal strength without clonus, no pain upon palpation of greater trochanters. No pain with internal/external rotation of bilateral hips. Sensation intact bilaterally. Walks independently, gait steady.   Skin:    General: Skin is warm and dry.     Capillary Refill: Capillary refill takes less than 2 seconds.  Neurological:     General: No focal deficit present.     Mental Status: She is alert and oriented to person, place, and time.  Psychiatric:        Mood and Affect: Mood normal.        Behavior: Behavior normal.    Ortho Exam  Imaging: No results found.  Past Medical/Family/Surgical/Social History: Medications & Allergies reviewed per EMR, new medications updated. Patient Active Problem List   Diagnosis Date Noted   Persistent cough 09/27/2021   Post-nasal drip 09/27/2021   Osteoarthritis 03/17/2020   Vertigo 03/17/2020   Binge eating disorder 05/27/2017   Headache 05/27/2017   Colon polyps 08/30/2010   Hyperlipidemia 06/30/2007   Past Medical History:  Diagnosis Date   Arthritis    hips, hands   Chronic kidney disease    hx of kidney stones   Frequency of urination    History of adenomatous polyp of colon 08/28/2005   Hyperlipidemia, mixed    on meds   Left ureteral  stone    Osteoarthritis    bilateral hips/lower back   Spinal stenosis    Vertigo    Wears glasses    Family History  Problem Relation Age of Onset   Coronary artery disease Mother    Arthritis Mother    Congestive Heart Failure Father    Heart attack Father    Hyperlipidemia Sister    Diabetes Sister    Heart disease Sister    Hyperlipidemia Sister    Hyperlipidemia Sister    Other Brother        coronary artery bypass graft age 79   Hyperlipidemia Brother    Diabetes Brother    Heart attack Maternal Aunt        at age 44   Breast cancer Neg Hx    Colon polyps Neg Hx    Colon cancer Neg Hx  Esophageal cancer Neg Hx    Rectal cancer Neg Hx    Stomach cancer Neg Hx    Past Surgical History:  Procedure Laterality Date   CESAREAN SECTION  1991   and 1993   COLONOSCOPY  2012   JP-F/V-movi(exc)-normal   CYSTOSCOPY/URETEROSCOPY/HOLMIUM LASER/STENT PLACEMENT Left 05/06/2017   Procedure: CYSTOSCOPY/URETEROSCOPY/HOLMIUM LASER/STENT PLACEMENT;  Surgeon: Hildred Laser, MD;  Location: [facility];  Service: Urology;  Laterality: Left;   DILATION AND CURETTAGE OF UTERUS  1980   w/ Suction for miscarriage  x2   WISDOM TOOTH EXTRACTION  1976   Social History   Occupational History   Not on file  Tobacco Use   Smoking status: Former    Packs/day: 1.00    Years: 5.00    Pack years: 5.00    Types: Cigarettes    Quit date: 04/30/1977    Years since quitting: 44.7   Smokeless tobacco: Never  Vaping Use   Vaping Use: Never used  Substance and Sexual Activity   Alcohol use: No    Alcohol/week: 0.0 standard drinks   Drug use: No   Sexual activity: Not on file

## 2022-01-10 ENCOUNTER — Telehealth: Payer: Self-pay | Admitting: Physical Medicine and Rehabilitation

## 2022-01-10 DIAGNOSIS — S29012A Strain of muscle and tendon of back wall of thorax, initial encounter: Secondary | ICD-10-CM | POA: Diagnosis not present

## 2022-01-10 DIAGNOSIS — M9905 Segmental and somatic dysfunction of pelvic region: Secondary | ICD-10-CM | POA: Diagnosis not present

## 2022-01-10 DIAGNOSIS — M9903 Segmental and somatic dysfunction of lumbar region: Secondary | ICD-10-CM | POA: Diagnosis not present

## 2022-01-10 DIAGNOSIS — M9902 Segmental and somatic dysfunction of thoracic region: Secondary | ICD-10-CM | POA: Diagnosis not present

## 2022-01-10 DIAGNOSIS — M25551 Pain in right hip: Secondary | ICD-10-CM | POA: Diagnosis not present

## 2022-01-10 DIAGNOSIS — M25552 Pain in left hip: Secondary | ICD-10-CM | POA: Diagnosis not present

## 2022-01-10 DIAGNOSIS — S39012A Strain of muscle, fascia and tendon of lower back, initial encounter: Secondary | ICD-10-CM | POA: Diagnosis not present

## 2022-01-10 DIAGNOSIS — M9906 Segmental and somatic dysfunction of lower extremity: Secondary | ICD-10-CM | POA: Diagnosis not present

## 2022-01-10 DIAGNOSIS — S338XXA Sprain of other parts of lumbar spine and pelvis, initial encounter: Secondary | ICD-10-CM | POA: Diagnosis not present

## 2022-01-10 NOTE — Telephone Encounter (Signed)
Please call the pt to schedule the injections in her back  ?

## 2022-01-16 ENCOUNTER — Telehealth: Payer: Self-pay | Admitting: Physical Medicine and Rehabilitation

## 2022-01-16 NOTE — Telephone Encounter (Signed)
Pt is calling back in  ?

## 2022-01-16 NOTE — Telephone Encounter (Signed)
Pt called to set 3 month out right hip injection from last date of injection. Please call pt at [phone]. ?

## 2022-01-21 ENCOUNTER — Telehealth: Payer: Self-pay | Admitting: Physical Medicine and Rehabilitation

## 2022-01-21 NOTE — Telephone Encounter (Signed)
Patient called needing a call back to schedule an appointment with Dr. Ernestina Patches. Patient said she called several times last week with no call back. The number to contact patient is [phone]  ?

## 2022-01-30 IMAGING — MG MM DIGITAL SCREENING BILAT W/ TOMO AND CAD
8 series · 8 of 24 positions shown · non-contrast
Comparison: Previous exam(s).

CLINICAL DATA: Screening.

EXAM:
DIGITAL SCREENING BILATERAL MAMMOGRAM WITH TOMOSYNTHESIS AND CAD
TECHNIQUE: Bilateral screening digital craniocaudal and mediolateral oblique
mammograms were obtained. Bilateral screening digital breast
tomosynthesis was performed. The images were evaluated with
computer-aided detection.

[R MLO synth-2D]
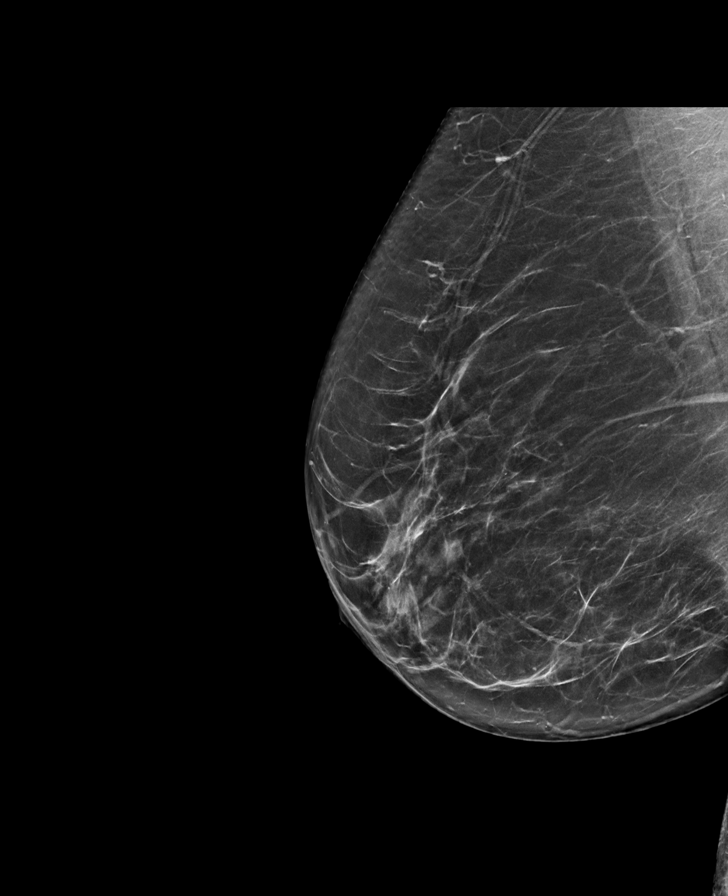

[L MLO synth-2D]
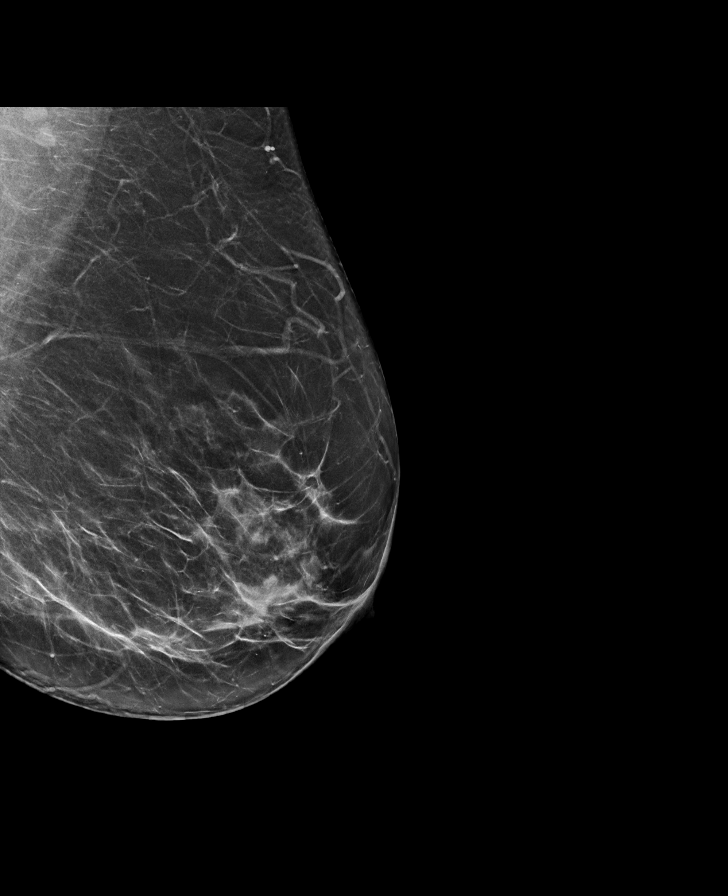

[R CC synth-2D]
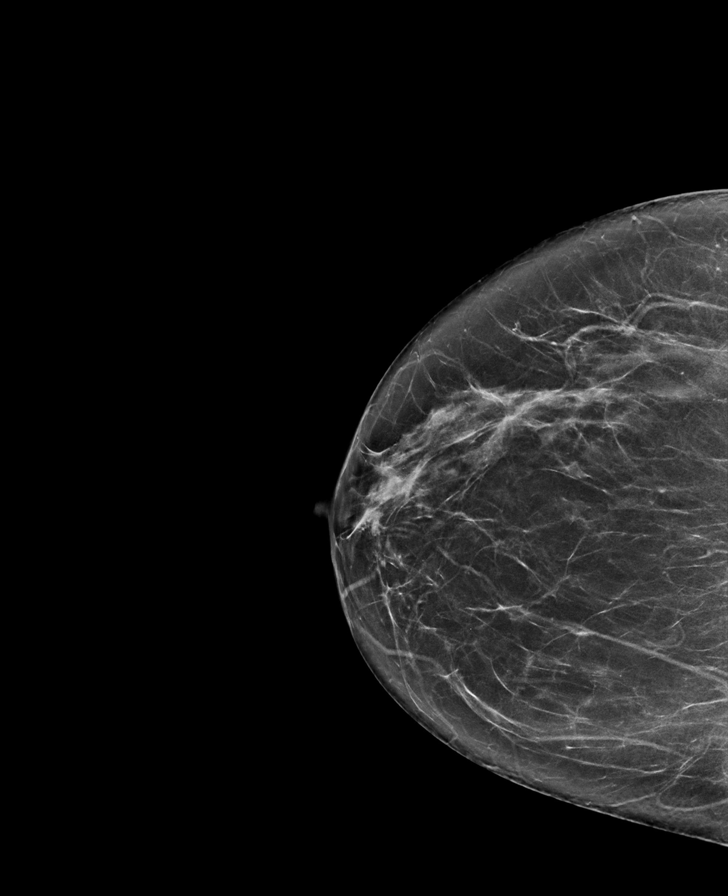

[L CC synth-2D]
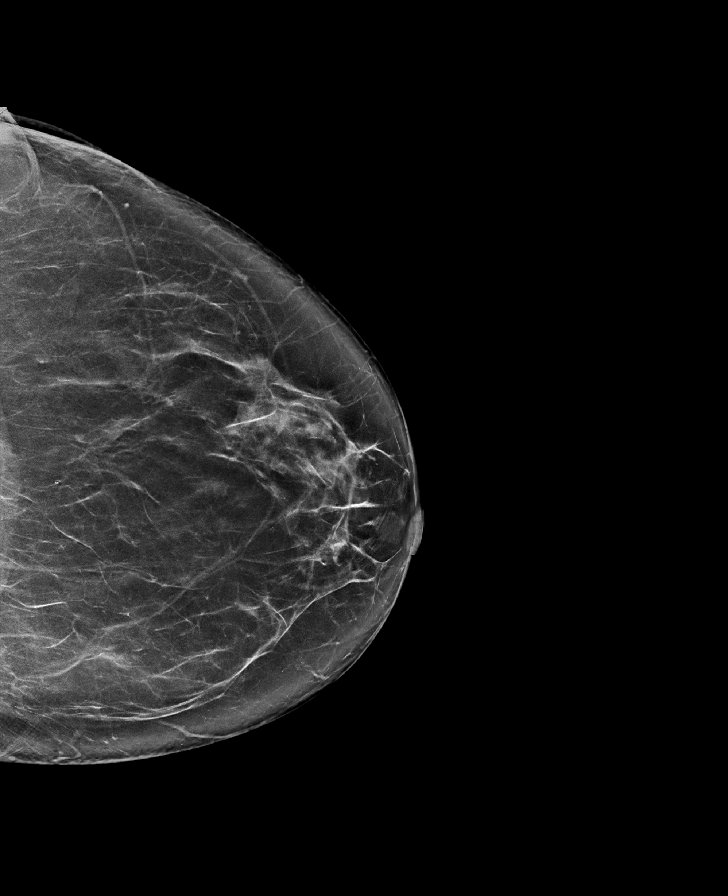

[L MLO tomo · tomo slice 39/77.0]
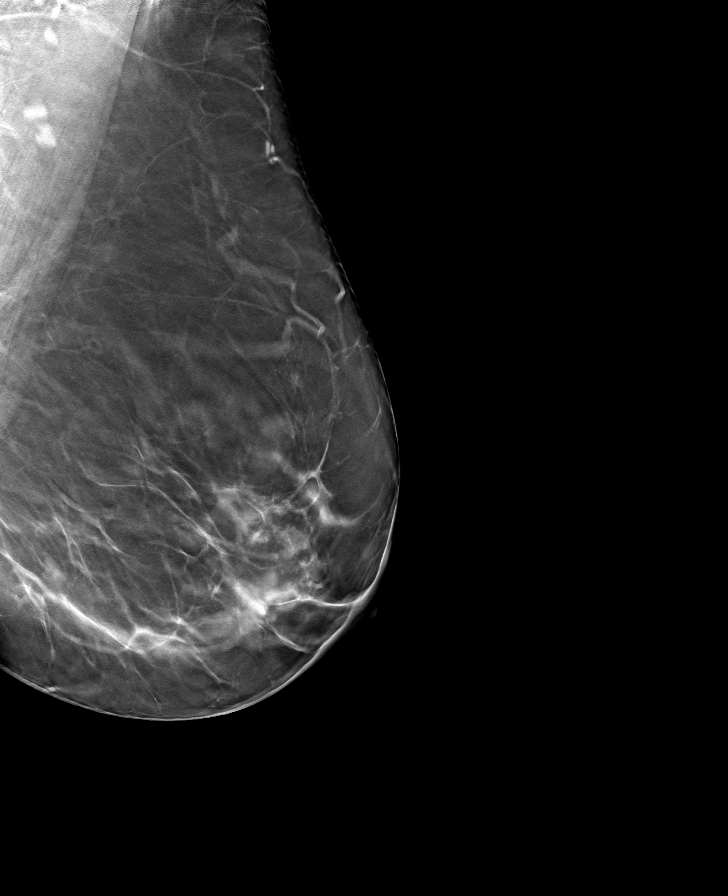

[L CC tomo · tomo slice 39/78.0]
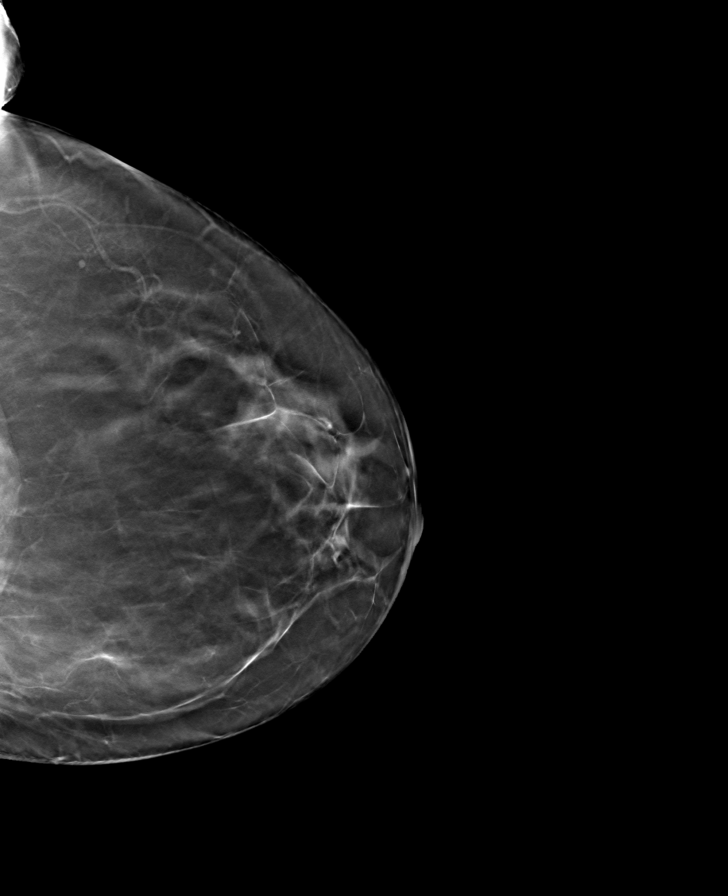

[R CC tomo · tomo slice 35/68.0]
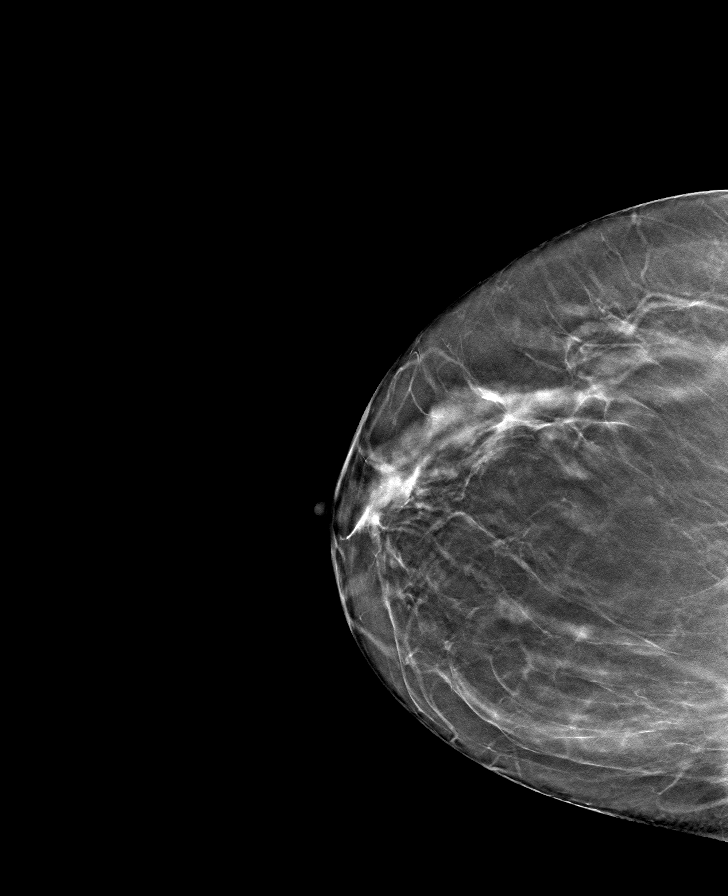

[R MLO tomo · tomo slice 37/73.0]
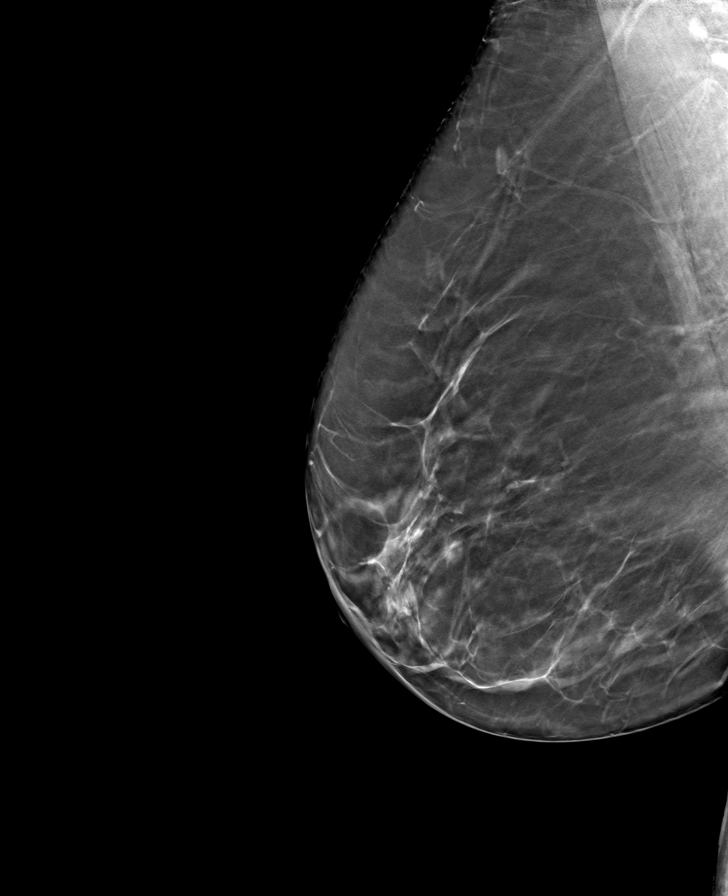

[8 of 24 positions shown; findings below may reference images not displayed]

ACR Breast Density Category b: There are scattered areas of
fibroglandular density.
FINDINGS: There are no findings suspicious for malignancy.
IMPRESSION: No mammographic evidence of malignancy. A result letter of this
screening mammogram will be mailed directly to the patient.

RECOMMENDATION:
Screening mammogram in one year. (Code:51-O-LD2)

BI-RADS CATEGORY  1: Negative.

## 2022-02-05 ENCOUNTER — Ambulatory Visit: Payer: Self-pay

## 2022-02-05 ENCOUNTER — Ambulatory Visit (INDEPENDENT_AMBULATORY_CARE_PROVIDER_SITE_OTHER): Payer: Medicare PPO | Admitting: Physical Medicine and Rehabilitation

## 2022-02-05 DIAGNOSIS — M47816 Spondylosis without myelopathy or radiculopathy, lumbar region: Secondary | ICD-10-CM

## 2022-02-05 MED ORDER — BUPIVACAINE HCL 0.5 % IJ SOLN: 3.0000 mL | Freq: Once | INTRAMUSCULAR | Status: DC

## 2022-02-05 NOTE — Progress Notes (Signed)
Not seen

## 2022-02-11 ENCOUNTER — Ambulatory Visit: Payer: Self-pay

## 2022-02-11 ENCOUNTER — Ambulatory Visit: Payer: Medicare PPO | Admitting: Physical Medicine and Rehabilitation

## 2022-02-11 ENCOUNTER — Encounter: Payer: Self-pay | Admitting: Physical Medicine and Rehabilitation

## 2022-02-11 VITALS — BP 127/84 | HR 85

## 2022-02-11 DIAGNOSIS — M47816 Spondylosis without myelopathy or radiculopathy, lumbar region: Secondary | ICD-10-CM

## 2022-02-11 MED ORDER — BUPIVACAINE HCL 0.5 % IJ SOLN: 3.0000 mL | Freq: Once | INTRAMUSCULAR | Status: DC

## 2022-02-11 NOTE — Progress Notes (Signed)
Pt state lower back pain that travels to both hips. Pt state any movement makes the pain worse. Pt state she takes over the counter pain meds to help ease her pain. ? ?Numeric Pain Rating Scale and Functional Assessment ?Average Pain 2 ? ? ?In the last MONTH (on 0-10 scale) has pain interfered with the following? ? ?1. General activity like being  able to carry out your everyday physical activities such as walking, climbing stairs, carrying groceries, or moving a chair?  ?Rating(8) ? ? ?+Driver, -BT, -Dye Allergies. ? ?

## 2022-02-11 NOTE — Patient Instructions (Signed)

## 2022-02-12 ENCOUNTER — Ambulatory Visit: Payer: Self-pay

## 2022-02-12 ENCOUNTER — Encounter: Payer: Self-pay | Admitting: Physical Medicine and Rehabilitation

## 2022-02-12 ENCOUNTER — Ambulatory Visit (INDEPENDENT_AMBULATORY_CARE_PROVIDER_SITE_OTHER): Payer: Medicare PPO | Admitting: Physical Medicine and Rehabilitation

## 2022-02-12 DIAGNOSIS — M25551 Pain in right hip: Secondary | ICD-10-CM | POA: Diagnosis not present

## 2022-02-12 DIAGNOSIS — M25552 Pain in left hip: Secondary | ICD-10-CM

## 2022-02-12 NOTE — Progress Notes (Signed)
Pt state pain in both hips. Pt state walking, standing and laying down makes the pain worse. Pt state she takes over the counter pain meds to help ease her pain.  Numeric Pain Rating Scale and Functional Assessment Average Pain 8   In the last MONTH (on 0-10 scale) has pain interfered with the following?  1. General activity like being  able to carry out your everyday physical activities such as walking, climbing stairs, carrying groceries, or moving a chair?  Rating(10)    -BT, -Dye Allergies.

## 2022-02-12 NOTE — Progress Notes (Signed)
Tina Aguilar - 66 y.o. female MRN [id number]  Date of birth: Mar 06, 1956  Office Visit Note: Visit Date: 02/12/2022 PCP: Vivi Barrack, MD Referred by: Vivi Barrack, MD  Subjective: Chief Complaint  Patient presents with   Right Hip - Pain   Left Hip - Pain   HPI:  Tina Aguilar is a 66 y.o. female who comes in today for planned repeat Bilateral anesthetic hip arthrogram with fluoroscopic guidance.  The patient has failed conservative care including home exercise, medications, time and activity modification. Prior injection gave more than 50% relief for several months. This injection will be diagnostic and hopefully therapeutic.  Please see requesting physician notes for further details and justification.  Referring: Dr. Joni Fears and Barnet Pall, FNP   ROS Otherwise per HPI.  Assessment & Plan: Visit Diagnoses:    ICD-10-CM   1. Pain in left hip  M25.552 Large Joint Inj: bilateral hip joint    XR C-ARM NO REPORT    2. Pain in right hip  M25.551 Large Joint Inj: bilateral hip joint    XR C-ARM NO REPORT      Plan: No additional findings.   Meds & Orders: No orders of the defined types were placed in this encounter.   Orders Placed This Encounter  Procedures   Large Joint Inj: bilateral hip joint   XR C-ARM NO REPORT    Follow-up: Return if symptoms worsen or fail to improve.   Procedures: Large Joint Inj: bilateral hip joint on 02/12/2022 10:26 AM Indications: diagnostic evaluation and pain Details: 22 G 3.5 in needle, fluoroscopy-guided anterior approach  Arthrogram: No  Medications (Right): 40 mg triamcinolone acetonide 40 MG/ML; 5 mL bupivacaine 0.25 % Medications (Left): 40 mg triamcinolone acetonide 40 MG/ML; 5 mL bupivacaine 0.25 % Outcome: tolerated well, no immediate complications  There was excellent flow of contrast producing a partial arthrogram of the hip. The patient did have relief of symptoms during the anesthetic  phase of the injection. Procedure, treatment alternatives, risks and benefits explained, specific risks discussed. Consent was given by the patient. Immediately prior to procedure a time out was called to verify the correct patient, procedure, equipment, support staff and site/side marked as required. Patient was prepped and draped in the usual sterile fashion.         Clinical History: EXAM: MRI LUMBAR SPINE WITHOUT CONTRAST   TECHNIQUE: Multiplanar, multisequence MR imaging of the lumbar spine was performed. No intravenous contrast was administered.   COMPARISON:  Lumbar radiographs 06/22/2018. [facility] CT Abdomen and Pelvis 03/09/2017.   FINDINGS: Segmentation:  Normal on the comparisons.   Alignment: Stable since 2018. Grade 1 anterolisthesis of L4 on L5 measuring 5-6 millimeters. Subtle retrolisthesis of L1 on L2. Preserved lumbar lordosis overall.   Vertebrae: Mild degenerative appearing endplate marrow edema at the L1-L2 endplates, to a lesser extent T12-L1 endplates. Minimal similar L5 superior endplate edema, and also possible minimal edema in the left L4 and L5 facets.   Background bone marrow signal is normal. No suspicious osseous lesion. Intact visible sacrum and SI joints.   Conus medullaris and cauda equina: Conus extends to the T12-L1 level. No lower spinal cord or conus signal abnormality.   Paraspinal and other soft tissues: Within normal limits; the left ureter in now appears decompressed compared to the 2018 CT. Negative visualized posterior paraspinal soft tissues.   Disc levels:   T10-T11: Negative.   T11-T12: Mild to moderate facet hypertrophy greater on the  left. Mild to moderate left T11 foraminal stenosis.   T12-L1: Minor circumferential disc bulge. Mild facet and ligament flavum hypertrophy. Borderline to mild bilateral T12 foraminal stenosis.   L1-L2: Mild circumferential disc bulge. Mild facet hypertrophy. Borderline to mild  left L1 foraminal stenosis.   L2-L3: Mild circumferential disc bulge. Mild facet hypertrophy. Borderline to mild spinal stenosis (series 5, image 24).   L3-L4: Mild circumferential disc bulge. Mild to moderate facet and ligament flavum hypertrophy. Mild to moderate spinal stenosis (series 5, image 29) with left greater than right lateral recess stenosis (L4 nerve levels). Mild right L3 foraminal stenosis.   L4-L5: Grade 1 anterolisthesis with disc space loss and mild circumferential disc bulge/pseudo disc. Severe bilateral facet hypertrophy. Mild to moderate ligament flavum hypertrophy. Borderline to mild spinal and bilateral lateral recess stenosis (L5 nerve levels). Borderline to mild left L4 foraminal stenosis.   L5-S1: Negative disc. Moderate to severe facet hypertrophy greater on the left. No stenosis.   IMPRESSION: 1. Up to moderate multifactorial spinal stenosis at L3-L4, and up to mild stenosis at L2-L3 and L4-L5. Disc bulging at these levels with posterior element hypertrophy which is severe at L4-L5 - due to chronic grade 1 anterolisthesis. 2. Up to severe facet degeneration also at L5-S1, but no associated stenosis at that level. 3. Similar lower thoracic disc bulging and intermittent facet hypertrophy. Up to moderate left T11 foraminal stenosis. 4. Minimal to mild degenerative appearing marrow edema at several levels, including the L1-L2 endplates, left L4-L5 facets.     Electronically Signed   By: Genevie Ann M.D.   On: 07/06/2018 08:15     Objective:  VS:  HT:    WT:   BMI:     BP:   HR: bpm  TEMP: ( )  RESP:  Physical Exam   Imaging: No results found.

## 2022-02-21 NOTE — Progress Notes (Signed)
? ?Tina Aguilar - 66 y.o. female MRN [id number]  Date of birth: [birth year] ? ?Office Visit Note: ?Visit Date: 02/11/2022 ?PCP: Vivi Barrack, MD ?Referred by: Vivi Barrack, MD ? ?Subjective: ?Chief Complaint  ?Patient presents with  ? Lower Back - Pain  ? Right Leg - Pain  ? Left Leg - Pain  ? ?HPI:  Tina Aguilar is a 66 y.o. female who comes in today at the request of Barnet Pall, FNP for planned Bilateral  L4-5 and L5-S1 Lumbar facet/medial branch block with fluoroscopic guidance.  The patient has failed conservative care including home exercise, medications, time and activity modification.  This injection will be diagnostic and hopefully therapeutic.  Please see requesting physician notes for further details and justification.  Exam has shown concordant pain with facet joint loading. MRI reviewed with images and spine model.  MRI reviewed in the note below.  ? ?ROS Otherwise per HPI. ? ?Assessment & Plan: ?Visit Diagnoses:  ?  ICD-10-CM   ?1. Spondylosis without myelopathy or radiculopathy, lumbar region  M47.816 XR C-ARM NO REPORT  ?  Facet Injection  ?  bupivacaine (MARCAINE) 0.5 % (with pres) injection 3 mL  ?  ?  ?Plan: No additional findings.  ? ?Meds & Orders:  ?Meds ordered this encounter  ?Medications  ? bupivacaine (MARCAINE) 0.5 % (with pres) injection 3 mL  ?  ?Orders Placed This Encounter  ?Procedures  ? Facet Injection  ? XR C-ARM NO REPORT  ?  ?Follow-up: No follow-ups on file.  ? ?Procedures: ?No procedures performed  ?   ? ?Clinical History: ?EXAM: ?MRI LUMBAR SPINE WITHOUT CONTRAST ?  ?TECHNIQUE: ?Multiplanar, multisequence MR imaging of the lumbar spine was ?performed. No intravenous contrast was administered. ?  ?COMPARISON:  Lumbar radiographs 06/22/2018. [facility] CT ?Abdomen and Pelvis 03/09/2017. ?  ?FINDINGS: ?Segmentation:  Normal on the comparisons. ?  ?Alignment: Stable since 2018. Grade 1 anterolisthesis of L4 on L5 ?measuring 5-6 millimeters.  Subtle retrolisthesis of L1 on L2. ?Preserved lumbar lordosis overall. ?  ?Vertebrae: Mild degenerative appearing endplate marrow edema at the ?L1-L2 endplates, to a lesser extent T12-L1 endplates. Minimal ?similar L5 superior endplate edema, and also possible minimal edema ?in the left L4 and L5 facets. ?  ?Background bone marrow signal is normal. No suspicious osseous ?lesion. Intact visible sacrum and SI joints. ?  ?Conus medullaris and cauda equina: Conus extends to the T12-L1 ?level. No lower spinal cord or conus signal abnormality. ?  ?Paraspinal and other soft tissues: Within normal limits; the left ?ureter in now appears decompressed compared to the 2018 CT. Negative ?visualized posterior paraspinal soft tissues. ?  ?Disc levels: ?  ?T10-T11: Negative. ?  ?T11-T12: Mild to moderate facet hypertrophy greater on the left. ?Mild to moderate left T11 foraminal stenosis. ?  ?T12-L1: Minor circumferential disc bulge. Mild facet and ligament ?flavum hypertrophy. Borderline to mild bilateral T12 foraminal ?stenosis. ?  ?L1-L2: Mild circumferential disc bulge. Mild facet hypertrophy. ?Borderline to mild left L1 foraminal stenosis. ?  ?L2-L3: Mild circumferential disc bulge. Mild facet hypertrophy. ?Borderline to mild spinal stenosis (series 5, image 24). ?  ?L3-L4: Mild circumferential disc bulge. Mild to moderate facet and ?ligament flavum hypertrophy. Mild to moderate spinal stenosis ?(series 5, image 29) with left greater than right lateral recess ?stenosis (L4 nerve levels). Mild right L3 foraminal stenosis. ?  ?L4-L5: Grade 1 anterolisthesis with disc space loss and mild ?circumferential disc bulge/pseudo disc. Severe bilateral facet ?hypertrophy. Mild to  moderate ligament flavum hypertrophy. ?Borderline to mild spinal and bilateral lateral recess stenosis (L5 ?nerve levels). Borderline to mild left L4 foraminal stenosis. ?  ?L5-S1: Negative disc. Moderate to severe facet hypertrophy greater ?on the left. No  stenosis. ?  ?IMPRESSION: ?1. Up to moderate multifactorial spinal stenosis at L3-L4, and up to ?mild stenosis at L2-L3 and L4-L5. ?Disc bulging at these levels with posterior element hypertrophy ?which is severe at L4-L5 - due to chronic grade 1 anterolisthesis. ?2. Up to severe facet degeneration also at L5-S1, but no associated ?stenosis at that level. ?3. Similar lower thoracic disc bulging and intermittent facet ?hypertrophy. Up to moderate left T11 foraminal stenosis. ?4. Minimal to mild degenerative appearing marrow edema at several ?levels, including the L1-L2 endplates, left L4-L5 facets. ?  ?  ?Electronically Signed ?  By: Genevie Ann M.D. ?  On: 07/06/2018 08:15  ? ? ? ?Objective:  VS:  HT:    WT:   BMI:     BP:127/84  HR:85bpm  TEMP: ( )  RESP:  ?Physical Exam ?Vitals and nursing note reviewed.  ?Constitutional:   ?   General: She is not in acute distress. ?   Appearance: Normal appearance. She is not ill-appearing.  ?HENT:  ?   Head: Normocephalic and atraumatic.  ?   Right Ear: External ear normal.  ?   Left Ear: External ear normal.  ?Eyes:  ?   Extraocular Movements: Extraocular movements intact.  ?Cardiovascular:  ?   Rate and Rhythm: Normal rate.  ?   Pulses: Normal pulses.  ?Pulmonary:  ?   Effort: Pulmonary effort is normal. No respiratory distress.  ?Abdominal:  ?   General: There is no distension.  ?   Palpations: Abdomen is soft.  ?Musculoskeletal:     ?   General: Tenderness present.  ?   Cervical back: Neck supple.  ?   Right lower leg: No edema.  ?   Left lower leg: No edema.  ?   Comments: Patient has good distal strength with no pain over the greater trochanters.  No clonus or focal weakness.  ?Skin: ?   Findings: No erythema, lesion or rash.  ?Neurological:  ?   General: No focal deficit present.  ?   Mental Status: She is alert and oriented to person, place, and time.  ?   Sensory: No sensory deficit.  ?   Motor: No weakness or abnormal muscle tone.  ?   Coordination: Coordination  normal.  ?Psychiatric:     ?   Mood and Affect: Mood normal.     ?   Behavior: Behavior normal.  ?  ? ?Imaging: ?No results found. ?

## 2022-02-21 NOTE — Procedures (Signed)
Lumbar Diagnostic Facet Joint Nerve Block with Fluoroscopic Guidance  ? ?Patient: Tina Aguilar      ?Date of Birth: 1956/05/01 ?MRN: [id number] ?PCP: Vivi Barrack, MD      ?Visit Date: 02/11/2022 ?  ?Universal Protocol:    ?Date/Time: 05/11/235:45 AM ? ?Consent Given By: the patient ? ?Position: PRONE ? ?Additional Comments: ?Vital signs were monitored before and after the procedure. ?Patient was prepped and draped in the usual sterile fashion. ?The correct patient, procedure, and site was verified. ? ? ?Injection Procedure Details:  ? ?Procedure diagnoses:  ?1. Spondylosis without myelopathy or radiculopathy, lumbar region   ?  ? ?Meds Administered:  ?Meds ordered this encounter  ?Medications  ? bupivacaine (MARCAINE) 0.5 % (with pres) injection 3 mL  ?  ? ?Laterality: Bilateral ? ?Location/Site: L4-L5, L3 and L4 medial branches and L5-S1, L4 medial branch and L5 dorsal ramus ? ?Needle: 5.0 in., 25 ga.  Short bevel or Quincke spinal needle ? ?Needle Placement: Oblique pedical ? ?Findings: ? ? -Comments: There was excellent flow of contrast along the articular pillars without intravascular flow. ? ?Procedure Details: ?The fluoroscope beam is vertically oriented in AP and then obliqued 15 to 20 degrees to the ipsilateral side of the desired nerve to achieve the ?Scotty dog? appearance.  The skin over the target area of the junction of the superior articulating process and the transverse process (sacral ala if blocking the L5 dorsal rami) was locally anesthetized with a 1 ml volume of 1% Lidocaine without Epinephrine.  The spinal needle was inserted and advanced in a trajectory view down to the target.  ? ?After contact with periosteum and negative aspirate for blood and CSF, correct placement without intravascular or epidural spread was confirmed by injecting 0.5 ml. of Isovue-250.  A spot radiograph was obtained of this image.   ? ?Next, a 0.5 ml. volume of the injectate described above was injected. The  needle was then redirected to the other facet joint nerves mentioned above if needed. ? ?Prior to the procedure, the patient was given a Pain Diary which was completed for baseline measurements.  After the procedure, the patient rated their pain every 30 minutes and will continue rating at this frequency for a total of 5 hours.  The patient has been asked to complete the Diary and return to [place] by mail, fax or hand delivered as soon as possible. ? ? ?Additional Comments:  ?No complications occurred ?Dressing: 2 x 2 sterile gauze and Band-Aid ?  ? ?Post-procedure details: ?Patient was observed during the procedure. ?Post-procedure instructions were reviewed. ? ?Patient left the clinic in stable condition.  ?

## 2022-02-27 ENCOUNTER — Telehealth: Payer: Self-pay | Admitting: Family Medicine

## 2022-02-27 NOTE — Telephone Encounter (Signed)
..  Type of form received: [facility] [place] [facility]  ? ?Additional comments:  ? ?Received by: Adonis Brook ? ?Form should be Faxed to: [phone] ? ?Form should be mailed to:   ? ?Is patient requesting call for pickup: ? ? ?Form placed:  In provider's box ? ?Attach charge sheet. yes ? ?Individual made aware of 3-5 business day turn around (Y/N)? yes ?  ?

## 2022-02-27 NOTE — Telephone Encounter (Signed)
Left message to return call to our office at their convenience.  ? ?Need to ask question for FL2 form  ? ?

## 2022-02-27 NOTE — Telephone Encounter (Signed)
Patient returning phone call.  Can be reached back at [phone].  Can leave VM if she does not answer.

## 2022-02-28 ENCOUNTER — Encounter: Payer: Self-pay | Admitting: Family Medicine

## 2022-02-28 NOTE — Telephone Encounter (Signed)
Patient calling back again in regard.

## 2022-02-28 NOTE — Telephone Encounter (Signed)
Left message to return call to our office at their convenience.  

## 2022-03-04 MED ORDER — BUPIVACAINE HCL 0.25 % IJ SOLN
5.0000 mL | INTRAMUSCULAR | Status: AC | PRN
  Administered 2022-02-12: 5 mL via INTRA_ARTICULAR

## 2022-03-04 MED ORDER — TRIAMCINOLONE ACETONIDE 40 MG/ML IJ SUSP
40.0000 mg | INTRAMUSCULAR | Status: AC | PRN
  Administered 2022-02-12: 40 mg via INTRA_ARTICULAR

## 2022-04-02 DIAGNOSIS — Z111 Encounter for screening for respiratory tuberculosis: Secondary | ICD-10-CM | POA: Diagnosis not present

## 2022-04-04 DIAGNOSIS — Z111 Encounter for screening for respiratory tuberculosis: Secondary | ICD-10-CM | POA: Diagnosis not present

## 2022-04-19 DIAGNOSIS — M9906 Segmental and somatic dysfunction of lower extremity: Secondary | ICD-10-CM | POA: Diagnosis not present

## 2022-04-19 DIAGNOSIS — M9903 Segmental and somatic dysfunction of lumbar region: Secondary | ICD-10-CM | POA: Diagnosis not present

## 2022-04-19 DIAGNOSIS — S39012A Strain of muscle, fascia and tendon of lower back, initial encounter: Secondary | ICD-10-CM | POA: Diagnosis not present

## 2022-04-19 DIAGNOSIS — M25552 Pain in left hip: Secondary | ICD-10-CM | POA: Diagnosis not present

## 2022-04-19 DIAGNOSIS — S338XXA Sprain of other parts of lumbar spine and pelvis, initial encounter: Secondary | ICD-10-CM | POA: Diagnosis not present

## 2022-04-19 DIAGNOSIS — S29012A Strain of muscle and tendon of back wall of thorax, initial encounter: Secondary | ICD-10-CM | POA: Diagnosis not present

## 2022-04-19 DIAGNOSIS — M9902 Segmental and somatic dysfunction of thoracic region: Secondary | ICD-10-CM | POA: Diagnosis not present

## 2022-04-19 DIAGNOSIS — M9905 Segmental and somatic dysfunction of pelvic region: Secondary | ICD-10-CM | POA: Diagnosis not present

## 2022-04-19 DIAGNOSIS — M25551 Pain in right hip: Secondary | ICD-10-CM | POA: Diagnosis not present

## 2022-04-25 ENCOUNTER — Telehealth: Payer: Self-pay | Admitting: Physical Medicine and Rehabilitation

## 2022-04-25 NOTE — Telephone Encounter (Signed)
Pt returned call to [facility]. Call pt at [phone]

## 2022-04-25 NOTE — Telephone Encounter (Signed)
Pt called requesting a call back for back and knee injections. Pt phone number is [phone].

## 2022-05-09 ENCOUNTER — Ambulatory Visit: Payer: Self-pay

## 2022-05-09 ENCOUNTER — Encounter: Payer: Self-pay | Admitting: Physical Medicine and Rehabilitation

## 2022-05-09 ENCOUNTER — Ambulatory Visit: Payer: Medicare PPO | Admitting: Physical Medicine and Rehabilitation

## 2022-05-09 DIAGNOSIS — M25552 Pain in left hip: Secondary | ICD-10-CM | POA: Diagnosis not present

## 2022-05-09 DIAGNOSIS — M25551 Pain in right hip: Secondary | ICD-10-CM

## 2022-05-09 NOTE — Progress Notes (Signed)
Pt state pain in both hips. Pt state walking, standing and laying down makes the pain worse. Pt state she takes over the counter pain meds to help ease her pain  Numeric Pain Rating Scale and Functional Assessment Average Pain 5   In the last MONTH (on 0-10 scale) has pain interfered with the following?  1. General activity like being  able to carry out your everyday physical activities such as walking, climbing stairs, carrying groceries, or moving a chair?  Rating(8)   +Driver, -BT, -Dye Allergies.

## 2022-05-09 NOTE — Progress Notes (Signed)
Tina Aguilar - 66 y.o. female MRN [id number]  Date of birth: [birth year]  Office Visit Note: Visit Date: 05/09/2022 PCP: Vivi Barrack, MD Referred by: Vivi Barrack, MD  Subjective: Chief Complaint  Patient presents with   Right Hip - Pain   Left Hip - Pain   HPI:  Tina Aguilar is a 66 y.o. female who comes in today for planned repeat Bilateral anesthetic hip arthrogram with fluoroscopic guidance.  The patient has failed conservative care including home exercise, medications, time and activity modification. Prior injection gave more than 50% relief for several months. This injection will be diagnostic and hopefully therapeutic.  Please see requesting physician notes for further details and justification.  Referring: Barnet Pall, FNP and Joni Fears, MD   ROS Otherwise per HPI.  Assessment & Plan: Visit Diagnoses:    ICD-10-CM   1. Pain in left hip  M25.552 Large Joint Inj: bilateral hip joint    XR C-ARM NO REPORT    2. Pain in right hip  M25.551 Large Joint Inj: bilateral hip joint    XR C-ARM NO REPORT      Plan: No additional findings.   Meds & Orders: No orders of the defined types were placed in this encounter.   Orders Placed This Encounter  Procedures   Large Joint Inj: bilateral hip joint   XR C-ARM NO REPORT    Follow-up: Return for visit to requesting provider as needed.   Procedures: Large Joint Inj: bilateral hip joint on 05/09/2022 8:47 AM Indications: diagnostic evaluation and pain Details: 22 G 3.5 in needle, fluoroscopy-guided anterior approach  Arthrogram: No  Outcome: tolerated well, no immediate complications  There was excellent flow of contrast producing a partial arthrogram of the hip. The patient did have relief of symptoms during the anesthetic phase of the injection. Procedure, treatment alternatives, risks and benefits explained, specific risks discussed. Consent was given by the patient. Immediately prior  to procedure a time out was called to verify the correct patient, procedure, equipment, support staff and site/side marked as required. Patient was prepped and draped in the usual sterile fashion.          Clinical History: EXAM: MRI LUMBAR SPINE WITHOUT CONTRAST   TECHNIQUE: Multiplanar, multisequence MR imaging of the lumbar spine was performed. No intravenous contrast was administered.   COMPARISON:  Lumbar radiographs 06/22/2018. [facility] CT Abdomen and Pelvis 03/09/2017.   FINDINGS: Segmentation:  Normal on the comparisons.   Alignment: Stable since 2018. Grade 1 anterolisthesis of L4 on L5 measuring 5-6 millimeters. Subtle retrolisthesis of L1 on L2. Preserved lumbar lordosis overall.   Vertebrae: Mild degenerative appearing endplate marrow edema at the L1-L2 endplates, to a lesser extent T12-L1 endplates. Minimal similar L5 superior endplate edema, and also possible minimal edema in the left L4 and L5 facets.   Background bone marrow signal is normal. No suspicious osseous lesion. Intact visible sacrum and SI joints.   Conus medullaris and cauda equina: Conus extends to the T12-L1 level. No lower spinal cord or conus signal abnormality.   Paraspinal and other soft tissues: Within normal limits; the left ureter in now appears decompressed compared to the 2018 CT. Negative visualized posterior paraspinal soft tissues.   Disc levels:   T10-T11: Negative.   T11-T12: Mild to moderate facet hypertrophy greater on the left. Mild to moderate left T11 foraminal stenosis.   T12-L1: Minor circumferential disc bulge. Mild facet and ligament flavum hypertrophy. Borderline to mild bilateral  T12 foraminal stenosis.   L1-L2: Mild circumferential disc bulge. Mild facet hypertrophy. Borderline to mild left L1 foraminal stenosis.   L2-L3: Mild circumferential disc bulge. Mild facet hypertrophy. Borderline to mild spinal stenosis (series 5, image 24).   L3-L4:  Mild circumferential disc bulge. Mild to moderate facet and ligament flavum hypertrophy. Mild to moderate spinal stenosis (series 5, image 29) with left greater than right lateral recess stenosis (L4 nerve levels). Mild right L3 foraminal stenosis.   L4-L5: Grade 1 anterolisthesis with disc space loss and mild circumferential disc bulge/pseudo disc. Severe bilateral facet hypertrophy. Mild to moderate ligament flavum hypertrophy. Borderline to mild spinal and bilateral lateral recess stenosis (L5 nerve levels). Borderline to mild left L4 foraminal stenosis.   L5-S1: Negative disc. Moderate to severe facet hypertrophy greater on the left. No stenosis.   IMPRESSION: 1. Up to moderate multifactorial spinal stenosis at L3-L4, and up to mild stenosis at L2-L3 and L4-L5. Disc bulging at these levels with posterior element hypertrophy which is severe at L4-L5 - due to chronic grade 1 anterolisthesis. 2. Up to severe facet degeneration also at L5-S1, but no associated stenosis at that level. 3. Similar lower thoracic disc bulging and intermittent facet hypertrophy. Up to moderate left T11 foraminal stenosis. 4. Minimal to mild degenerative appearing marrow edema at several levels, including the L1-L2 endplates, left L4-L5 facets.     Electronically Signed   By: Genevie Ann M.D.   On: 07/06/2018 08:15     Objective:  VS:  HT:    WT:   BMI:     BP:   HR: bpm  TEMP: ( )  RESP:  Physical Exam   Imaging: No results found.

## 2022-05-22 ENCOUNTER — Encounter: Payer: Self-pay | Admitting: Physical Medicine and Rehabilitation

## 2022-05-22 ENCOUNTER — Ambulatory Visit: Payer: Self-pay

## 2022-05-22 ENCOUNTER — Ambulatory Visit (INDEPENDENT_AMBULATORY_CARE_PROVIDER_SITE_OTHER): Payer: Medicare PPO | Admitting: Physical Medicine and Rehabilitation

## 2022-05-22 ENCOUNTER — Encounter (INDEPENDENT_AMBULATORY_CARE_PROVIDER_SITE_OTHER): Payer: Self-pay

## 2022-05-22 VITALS — BP 130/73 | HR 87

## 2022-05-22 DIAGNOSIS — M47816 Spondylosis without myelopathy or radiculopathy, lumbar region: Secondary | ICD-10-CM

## 2022-05-22 MED ORDER — BUPIVACAINE HCL 0.5 % IJ SOLN
3.0000 mL | Freq: Once | INTRAMUSCULAR | Status: AC
  Administered 2022-05-22: 3 mL

## 2022-05-22 NOTE — Progress Notes (Signed)
Pt state lower back pain. Pt state any movement makes the pain worse. Pt state she takes over the counter pain meds to help ease her pain.  Numeric Pain Rating Scale and Functional Assessment Average Pain 1   In the last MONTH (on 0-10 scale) has pain interfered with the following?  1. General activity like being  able to carry out your everyday physical activities such as walking, climbing stairs, carrying groceries, or moving a chair?  Rating(8)   +Driver, -BT, -Dye Allergies.

## 2022-05-22 NOTE — Patient Instructions (Signed)

## 2022-05-29 NOTE — Progress Notes (Signed)
Tina Aguilar - 66 y.o. female MRN [id number]  Date of birth: 1956/01/20  Office Visit Note: Visit Date: 05/22/2022 PCP: Vivi Barrack, MD Referred by: Vivi Barrack, MD  Subjective: Chief Complaint  Patient presents with   Lower Back - Pain   HPI:  Tina Aguilar is a 66 y.o. female who comes in today for planned repeat Bilateral L4-5 and L5-S1 Lumbar facet/medial branch block with fluoroscopic guidance.  The patient has failed conservative care including home exercise, medications, time and activity modification.  This injection will be diagnostic and hopefully therapeutic.  Please see requesting physician notes for further details and justification.  Exam shows concordant low back pain with facet joint loading and extension. Patient received more than 80% pain relief from prior injection. This would be the second block in a diagnostic double block paradigm.     Referring:Megan Jimmye Norman, FNP   ROS Otherwise per HPI.  Assessment & Plan: Visit Diagnoses:    ICD-10-CM   1. Spondylosis without myelopathy or radiculopathy, lumbar region  M47.816 XR C-ARM NO REPORT    Facet Injection    bupivacaine (MARCAINE) 0.5 % (with pres) injection 3 mL      Plan: No additional findings.   Meds & Orders:  Meds ordered this encounter  Medications   bupivacaine (MARCAINE) 0.5 % (with pres) injection 3 mL    Orders Placed This Encounter  Procedures   Facet Injection   XR C-ARM NO REPORT    Follow-up: Return for Review Pain Diary.   Procedures: No procedures performed  Lumbar Diagnostic Facet Joint Nerve Block with Fluoroscopic Guidance   Patient: Tina Aguilar      Date of Birth: 07-02-1956 MRN: [id number] PCP: Vivi Barrack, MD      Visit Date: 05/22/2022   Universal Protocol:    Date/Time: 08/16/236:35 PM  Consent Given By: the patient  Position: PRONE  Additional Comments: Vital signs were monitored before and after the  procedure. Patient was prepped and draped in the usual sterile fashion. The correct patient, procedure, and site was verified.   Injection Procedure Details:   Procedure diagnoses:  1. Spondylosis without myelopathy or radiculopathy, lumbar region      Meds Administered:  Meds ordered this encounter  Medications   bupivacaine (MARCAINE) 0.5 % (with pres) injection 3 mL     Laterality: Bilateral  Location/Site: L4-L5, L3 and L4 medial branches and L5-S1, L4 medial branch and L5 dorsal ramus  Needle: 5.0 in., 25 ga.  Short bevel or Quincke spinal needle  Needle Placement: Oblique pedical  Findings:   -Comments: There was excellent flow of contrast along the articular pillars without intravascular flow.  Procedure Details: The fluoroscope beam is vertically oriented in AP and then obliqued 15 to 20 degrees to the ipsilateral side of the desired nerve to achieve the "Scotty dog" appearance.  The skin over the target area of the junction of the superior articulating process and the transverse process (sacral ala if blocking the L5 dorsal rami) was locally anesthetized with a 1 ml volume of 1% Lidocaine without Epinephrine.  The spinal needle was inserted and advanced in a trajectory view down to the target.   After contact with periosteum and negative aspirate for blood and CSF, correct placement without intravascular or epidural spread was confirmed by injecting 0.5 ml. of Isovue-250.  A spot radiograph was obtained of this image.    Next, a 0.5 ml. volume of the injectate described above was  injected. The needle was then redirected to the other facet joint nerves mentioned above if needed.  Prior to the procedure, the patient was given a Pain Diary which was completed for baseline measurements.  After the procedure, the patient rated their pain every 30 minutes and will continue rating at this frequency for a total of 5 hours.  The patient has been asked to complete the Diary and  return to [place] by mail, fax or hand delivered as soon as possible.   Additional Comments:  The patient tolerated the procedure well Dressing: 2 x 2 sterile gauze and Band-Aid    Post-procedure details: Patient was observed during the procedure. Post-procedure instructions were reviewed.  Patient left the clinic in stable condition.   Clinical History: EXAM: MRI LUMBAR SPINE WITHOUT CONTRAST   TECHNIQUE: Multiplanar, multisequence MR imaging of the lumbar spine was performed. No intravenous contrast was administered.   COMPARISON:  Lumbar radiographs 06/22/2018. [facility] CT Abdomen and Pelvis 03/09/2017.   FINDINGS: Segmentation:  Normal on the comparisons.   Alignment: Stable since 2018. Grade 1 anterolisthesis of L4 on L5 measuring 5-6 millimeters. Subtle retrolisthesis of L1 on L2. Preserved lumbar lordosis overall.   Vertebrae: Mild degenerative appearing endplate marrow edema at the L1-L2 endplates, to a lesser extent T12-L1 endplates. Minimal similar L5 superior endplate edema, and also possible minimal edema in the left L4 and L5 facets.   Background bone marrow signal is normal. No suspicious osseous lesion. Intact visible sacrum and SI joints.   Conus medullaris and cauda equina: Conus extends to the T12-L1 level. No lower spinal cord or conus signal abnormality.   Paraspinal and other soft tissues: Within normal limits; the left ureter in now appears decompressed compared to the 2018 CT. Negative visualized posterior paraspinal soft tissues.   Disc levels:   T10-T11: Negative.   T11-T12: Mild to moderate facet hypertrophy greater on the left. Mild to moderate left T11 foraminal stenosis.   T12-L1: Minor circumferential disc bulge. Mild facet and ligament flavum hypertrophy. Borderline to mild bilateral T12 foraminal stenosis.   L1-L2: Mild circumferential disc bulge. Mild facet hypertrophy. Borderline to mild left L1 foraminal stenosis.    L2-L3: Mild circumferential disc bulge. Mild facet hypertrophy. Borderline to mild spinal stenosis (series 5, image 24).   L3-L4: Mild circumferential disc bulge. Mild to moderate facet and ligament flavum hypertrophy. Mild to moderate spinal stenosis (series 5, image 29) with left greater than right lateral recess stenosis (L4 nerve levels). Mild right L3 foraminal stenosis.   L4-L5: Grade 1 anterolisthesis with disc space loss and mild circumferential disc bulge/pseudo disc. Severe bilateral facet hypertrophy. Mild to moderate ligament flavum hypertrophy. Borderline to mild spinal and bilateral lateral recess stenosis (L5 nerve levels). Borderline to mild left L4 foraminal stenosis.   L5-S1: Negative disc. Moderate to severe facet hypertrophy greater on the left. No stenosis.   IMPRESSION: 1. Up to moderate multifactorial spinal stenosis at L3-L4, and up to mild stenosis at L2-L3 and L4-L5. Disc bulging at these levels with posterior element hypertrophy which is severe at L4-L5 - due to chronic grade 1 anterolisthesis. 2. Up to severe facet degeneration also at L5-S1, but no associated stenosis at that level. 3. Similar lower thoracic disc bulging and intermittent facet hypertrophy. Up to moderate left T11 foraminal stenosis. 4. Minimal to mild degenerative appearing marrow edema at several levels, including the L1-L2 endplates, left L4-L5 facets.     Electronically Signed   By: Herminio Heads.D.  On: 07/06/2018 08:15     Objective:  VS:  HT:    WT:   BMI:     BP:130/73  HR:87bpm  TEMP: ( )  RESP:  Physical Exam Vitals and nursing note reviewed.  Constitutional:      General: She is not in acute distress.    Appearance: Normal appearance. She is not ill-appearing.  HENT:     Head: Normocephalic and atraumatic.     Right Ear: External ear normal.     Left Ear: External ear normal.  Eyes:     Extraocular Movements: Extraocular movements intact.  Cardiovascular:      Rate and Rhythm: Normal rate.     Pulses: Normal pulses.  Pulmonary:     Effort: Pulmonary effort is normal. No respiratory distress.  Abdominal:     General: There is no distension.     Palpations: Abdomen is soft.  Musculoskeletal:        General: Tenderness present.     Cervical back: Neck supple.     Right lower leg: No edema.     Left lower leg: No edema.     Comments: Patient has good distal strength with no pain over the greater trochanters.  No clonus or focal weakness.  Skin:    Findings: No erythema, lesion or rash.  Neurological:     General: No focal deficit present.     Mental Status: She is alert and oriented to person, place, and time.     Sensory: No sensory deficit.     Motor: No weakness or abnormal muscle tone.     Coordination: Coordination normal.  Psychiatric:        Mood and Affect: Mood normal.        Behavior: Behavior normal.      Imaging: No results found.

## 2022-05-29 NOTE — Procedures (Signed)
Lumbar Diagnostic Facet Joint Nerve Block with Fluoroscopic Guidance   Patient: Tina Aguilar      Date of Birth: [birth year] MRN: [id number] PCP: Vivi Barrack, MD      Visit Date: 05/22/2022   Universal Protocol:    Date/Time: 08/16/236:35 PM  Consent Given By: the patient  Position: PRONE  Additional Comments: Vital signs were monitored before and after the procedure. Patient was prepped and draped in the usual sterile fashion. The correct patient, procedure, and site was verified.   Injection Procedure Details:   Procedure diagnoses:  1. Spondylosis without myelopathy or radiculopathy, lumbar region      Meds Administered:  Meds ordered this encounter  Medications   bupivacaine (MARCAINE) 0.5 % (with pres) injection 3 mL     Laterality: Bilateral  Location/Site: L4-L5, L3 and L4 medial branches and L5-S1, L4 medial branch and L5 dorsal ramus  Needle: 5.0 in., 25 ga.  Short bevel or Quincke spinal needle  Needle Placement: Oblique pedical  Findings:   -Comments: There was excellent flow of contrast along the articular pillars without intravascular flow.  Procedure Details: The fluoroscope beam is vertically oriented in AP and then obliqued 15 to 20 degrees to the ipsilateral side of the desired nerve to achieve the "Scotty dog" appearance.  The skin over the target area of the junction of the superior articulating process and the transverse process (sacral ala if blocking the L5 dorsal rami) was locally anesthetized with a 1 ml volume of 1% Lidocaine without Epinephrine.  The spinal needle was inserted and advanced in a trajectory view down to the target.   After contact with periosteum and negative aspirate for blood and CSF, correct placement without intravascular or epidural spread was confirmed by injecting 0.5 ml. of Isovue-250.  A spot radiograph was obtained of this image.    Next, a 0.5 ml. volume of the injectate described above was injected. The  needle was then redirected to the other facet joint nerves mentioned above if needed.  Prior to the procedure, the patient was given a Pain Diary which was completed for baseline measurements.  After the procedure, the patient rated their pain every 30 minutes and will continue rating at this frequency for a total of 5 hours.  The patient has been asked to complete the Diary and return to [place] by mail, fax or hand delivered as soon as possible.   Additional Comments:  The patient tolerated the procedure well Dressing: 2 x 2 sterile gauze and Band-Aid    Post-procedure details: Patient was observed during the procedure. Post-procedure instructions were reviewed.  Patient left the clinic in stable condition.

## 2022-06-03 ENCOUNTER — Other Ambulatory Visit: Payer: Self-pay | Admitting: Family Medicine

## 2022-06-03 DIAGNOSIS — Z1231 Encounter for screening mammogram for malignant neoplasm of breast: Secondary | ICD-10-CM

## 2022-06-11 ENCOUNTER — Telehealth: Payer: Self-pay | Admitting: Family Medicine

## 2022-06-11 NOTE — Telephone Encounter (Signed)
FYI  Patient states: - She is in need of an FL2 form being completed for her ASAP   I informed patient that she may drop the form off but there is a completion window of 3-5 days.

## 2022-06-11 NOTE — Telephone Encounter (Signed)
..  Type of form received: FL2  Additional comments:   Received by: patient drop off  Form should be Faxed to: NA  Form should be mailed to:   NA   Is patient requesting call for pickup: yes    Form placed:   Dr Ellwood Handler folder   Attach charge sheet.  Yes   Individual made aware of 3-5 business day turn around (Y/N)?  Yes

## 2022-06-12 NOTE — Telephone Encounter (Signed)
Placed in PCP office to be sign

## 2022-06-12 NOTE — Telephone Encounter (Signed)
Patient states that if the [facility] form has not been signed by this afternoon-Patient states she will homeless.  Patient requests to be called at ph# [phone] when form is ready for Patient to pick up.

## 2022-06-13 NOTE — Telephone Encounter (Signed)
Spoke with patient, notified no faxed or phone number given  Patient stated will pick up form today, form placed at front office and copy placed to be scan

## 2022-06-24 ENCOUNTER — Ambulatory Visit: Payer: Medicare PPO

## 2022-07-05 ENCOUNTER — Ambulatory Visit
Admission: RE | Admit: 2022-07-05 | Discharge: 2022-07-05 | Disposition: A | Discharge status: Home / Self Care | Payer: Medicare PPO | Source: Ambulatory Visit | Attending: Family Medicine | Admitting: Family Medicine

## 2022-07-05 DIAGNOSIS — Z1231 Encounter for screening mammogram for malignant neoplasm of breast: Secondary | ICD-10-CM

## 2022-07-11 ENCOUNTER — Telehealth: Payer: Self-pay | Admitting: Family Medicine

## 2022-07-11 NOTE — Telephone Encounter (Signed)
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 She does have her CPE scheduled for 10/18 with PCP.

## 2022-07-11 NOTE — Telephone Encounter (Signed)
.  Type of form received: Health Examination certificate  Additional comments: Need completed as soon as able  Received by: Havlyn Ratchford  Form should be Faxed to: N/A  Form should be mailed to:  N/A  Is patient requesting call for pickup: Yes at [phone]   Form placed:  In provider's box  Attach charge sheet. Yes  Individual made aware of 3-5 business day turn around (Y/N)? Yes

## 2022-07-12 NOTE — Telephone Encounter (Signed)
Left message to return call to our office at their convenience.   Last physical done on 07/26/2021 Form can be done on next [facility] appt 07/31/2022 Form placed in CMA basket

## 2022-07-12 NOTE — Telephone Encounter (Signed)
Patient return call, informed patient unable to do form until next [facility] visit  Patient verbalized understanding

## 2022-07-29 ENCOUNTER — Encounter: Payer: Medicare PPO | Admitting: Family Medicine

## 2022-07-31 ENCOUNTER — Encounter: Payer: Self-pay | Admitting: Family Medicine

## 2022-07-31 ENCOUNTER — Ambulatory Visit (INDEPENDENT_AMBULATORY_CARE_PROVIDER_SITE_OTHER): Payer: Medicare PPO | Admitting: Family Medicine

## 2022-07-31 VITALS — BP 128/84 | HR 74 | Temp 97.3°F | Ht 63.0 in | Wt 166.6 lb

## 2022-07-31 DIAGNOSIS — M160 Bilateral primary osteoarthritis of hip: Secondary | ICD-10-CM | POA: Diagnosis not present

## 2022-07-31 DIAGNOSIS — F439 Reaction to severe stress, unspecified: Secondary | ICD-10-CM | POA: Insufficient documentation

## 2022-07-31 DIAGNOSIS — R739 Hyperglycemia, unspecified: Secondary | ICD-10-CM | POA: Diagnosis not present

## 2022-07-31 DIAGNOSIS — E7800 Pure hypercholesterolemia, unspecified: Secondary | ICD-10-CM | POA: Diagnosis not present

## 2022-07-31 DIAGNOSIS — Z0001 Encounter for general adult medical examination with abnormal findings: Secondary | ICD-10-CM | POA: Diagnosis not present

## 2022-07-31 LAB — COMPREHENSIVE METABOLIC PANEL WITH GFR
ALT: 31 U/L (ref 0–35)
AST: 26 U/L (ref 0–37)
Albumin: 4.3 g/dL (ref 3.5–5.2)
Alkaline Phosphatase: 63 U/L (ref 39–117)
BUN: 11 mg/dL (ref 6–23)
CO2: 29 meq/L (ref 19–32)
Calcium: 9.6 mg/dL (ref 8.4–10.5)
Chloride: 105 meq/L (ref 96–112)
Creatinine, Ser: 0.59 mg/dL (ref 0.40–1.20)
GFR: 93.36 mL/min
Glucose, Bld: 93 mg/dL (ref 70–99)
Potassium: 3.8 meq/L (ref 3.5–5.1)
Sodium: 141 meq/L (ref 135–145)
Total Bilirubin: 0.6 mg/dL (ref 0.2–1.2)
Total Protein: 7 g/dL (ref 6.0–8.3)

## 2022-07-31 LAB — HEMOGLOBIN A1C: Hgb A1c MFr Bld: 5.8 % (ref 4.6–6.5)

## 2022-07-31 LAB — CBC
HCT: 41.6 % (ref 36.0–46.0)
Hemoglobin: 13.9 g/dL (ref 12.0–15.0)
MCHC: 33.4 g/dL (ref 30.0–36.0)
MCV: 90.7 fl (ref 78.0–100.0)
Platelets: 265 10*3/uL (ref 150.0–400.0)
RBC: 4.58 Mil/uL (ref 3.87–5.11)
RDW: 14.2 % (ref 11.5–15.5)
WBC: 6 10*3/uL (ref 4.0–10.5)

## 2022-07-31 LAB — LIPID PANEL
Cholesterol: 214 mg/dL — ABNORMAL HIGH (ref 0–200)
HDL: 77.9 mg/dL (ref >39.00)
LDL Cholesterol: 121 mg/dL — ABNORMAL HIGH (ref 0–99)
NonHDL: 136.21
Total CHOL/HDL Ratio: 3
Triglycerides: 74 mg/dL (ref 0.0–149.0)
VLDL: 14.8 mg/dL (ref 0.0–40.0)

## 2022-07-31 LAB — TSH: TSH: 1.58 u[IU]/mL (ref 0.35–5.50)

## 2022-07-31 NOTE — Progress Notes (Signed)
Chief Complaint:  Tina Aguilar is a 66 y.o. female who presents today for her annual comprehensive physical exam.    Assessment/Plan:  Chronic Problems Addressed Today: Hyperlipidemia On Vytorin 10-10 daily.  Check labs.  Discussed lifestyle modifications.  Stress She has been under quite a bit of stress recently with the declining health of her husband as well as her own physical health.  Feels like things are stabilized now.  We gave contact information for behavioral health specialist.  She will call if she has worsening stress though overall feels like things are manageable now.  Osteoarthritis Continue management per orthopedics.  Preventative Healthcare: Check labs.  Up-to-date on cancer screening.  She would like to have Pap smear done next year at least one more time before discontinuing.  Has never had abnormal Pap smears in the past.  Patient Counseling(The following topics were reviewed and/or handout was given):  -Nutrition: Stressed importance of moderation in sodium/caffeine intake, saturated fat and cholesterol, caloric balance, sufficient intake of fresh fruits, vegetables, and fiber.  -Stressed the importance of regular exercise.   -Substance Abuse: Discussed cessation/primary prevention of tobacco, alcohol, or other drug use; driving or other dangerous activities under the influence; availability of treatment for abuse.   -Injury prevention: Discussed safety belts, safety helmets, smoke detector, smoking near bedding or upholstery.   -Sexuality: Discussed sexually transmitted diseases, partner selection, use of condoms, avoidance of unintended pregnancy and contraceptive alternatives.   -Dental health: Discussed importance of regular tooth brushing, flossing, and dental visits.  -Health maintenance and immunizations reviewed. Please refer to Health maintenance section.  Return to care in 1 year for next preventative visit.     Subjective:  HPI:  She  has no acute complaints today. See a/p for status of chronic conditions.   Lifestyle Diet: Balanced.  Exercise: Walking dog 3-5 times per day.      07/31/2022    8:02 AM  Depression screen PHQ 2/9  Decreased Interest 0  Down, Depressed, Hopeless 0  PHQ - 2 Score 0   ROS: Per HPI, otherwise a complete review of systems was negative.   PMH:  The following were reviewed and entered/updated in epic: Past Medical History:  Diagnosis Date   Arthritis    hips, hands   Chronic kidney disease    hx of kidney stones   Frequency of urination    History of adenomatous polyp of colon 08/28/2005   Hyperlipidemia, mixed    on meds   Left ureteral stone    Osteoarthritis    bilateral hips/lower back   Spinal stenosis    Vertigo    Wears glasses    Patient Active Problem List   Diagnosis Date Noted   Stress 07/31/2022   Osteoarthritis 03/17/2020   Vertigo 03/17/2020   Binge eating disorder 05/27/2017   Headache 05/27/2017   Colon polyps 08/30/2010   Hyperlipidemia 06/30/2007   Past Surgical History:  Procedure Laterality Date   [place]  2012   JP-F/V-movi(exc)-normal   CYSTOSCOPY/URETEROSCOPY/HOLMIUM LASER/STENT PLACEMENT Left 05/06/2017   Procedure: CYSTOSCOPY/URETEROSCOPY/HOLMIUM LASER/STENT PLACEMENT;  Surgeon: Nickie Retort, MD;  Location: [facility];  Service: Urology;  Laterality: Left;   DILATION AND CURETTAGE OF UTERUS  1980   w/ Suction for miscarriage  x2   WISDOM TOOTH EXTRACTION  1976    Family History  Problem Relation Age of Onset   Coronary artery disease Mother    Arthritis  Mother    Congestive Heart Failure Father    Heart attack Father    Hyperlipidemia Sister    Diabetes Sister    Heart disease Sister    Hyperlipidemia Sister    Hyperlipidemia Sister    Other Brother        coronary artery bypass graft age 12   Hyperlipidemia Brother    Diabetes Brother    Heart attack Maternal  Aunt        at age 26   Breast cancer Neg Hx    Colon polyps Neg Hx    Colon cancer Neg Hx    Esophageal cancer Neg Hx    Rectal cancer Neg Hx    Stomach cancer Neg Hx     Medications- reviewed and updated Current Outpatient Medications  Medication Sig Dispense Refill   ezetimibe-simvastatin (VYTORIN) 10-10 MG tablet Take 1 tablet by mouth at bedtime. 90 tablet 3   Multiple Vitamin (MULTIVITAMIN) capsule Take 2 capsules by mouth daily.     Naproxen Sodium (ALEVE PO) Take 1 tablet by mouth daily as needed.     Riboflavin (B-2) 100 MG TABS Take by mouth.     No current facility-administered medications for this visit.    Allergies-reviewed and updated Allergies  Allergen Reactions   Diclofenac Other (See Comments)    Dizziness    Social History   Socioeconomic History   Marital status: Married    Spouse name: Not on file   Number of children: Not on file   Years of education: Not on file   Highest education level: Not on file  Occupational History   Not on file  Tobacco Use   Smoking status: Former    Packs/day: 1.00    Years: 5.00    Total pack years: 5.00    Types: Cigarettes    Quit date: 04/30/1977    Years since quitting: 45.2   Smokeless tobacco: Never  Vaping Use   Vaping Use: Never used  Substance and Sexual Activity   Alcohol use: No    Alcohol/week: 0.0 standard drinks of alcohol   Drug use: No   Sexual activity: Not on file  Other Topics Concern   Not on file  Social History Narrative   [employer], [employer]   Married '84   Two grown daughters '91 '93   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   SO is in 70's and in good health, has RA   Social Determinants of Radio broadcast assistant Strain: Low Risk  (11/06/2021)   Overall Financial Resource Strain (CARDIA)    Difficulty of Paying Living Expenses: Not hard at all  Food Insecurity: No Food Insecurity (11/06/2021)   Hunger Vital Sign    Worried About Running Out of Food in the  Last Year: Never true    Ran Out of Food in the Last Year: Never true  Transportation Needs: No Transportation Needs (11/06/2021)   PRAPARE - Hydrologist (Medical): No    Lack of Transportation (Non-Medical): No  Physical Activity: Insufficiently Active (11/06/2021)   Exercise Vital Sign    Days of Exercise per Week: 7 days    Minutes of Exercise per Session: 20 min  Stress: Stress Concern Present (11/06/2021)   [place]    Feeling of Stress : To some extent  Social Connections: Socially Integrated (11/06/2021)   Social Connection and Isolation Panel [NHANES]  Frequency of Communication with Friends and Family: More than three times a week    Frequency of Social Gatherings with Friends and Family: More than three times a week    Attends Religious Services: More than 4 times per year    Active Member of [employer] or Organizations: Yes    Attends Archivist Meetings: 1 to 4 times per year    Marital Status: Married        Objective:  Physical Exam: BP 128/84   Pulse 74   Temp (!) 97.3 F (36.3 C) (Temporal)   Ht '5\' 3"'$  (1.6 m)   Wt 166 lb 9.6 oz (75.6 kg)   SpO2 99%   BMI 29.51 kg/m   Body mass index is 29.51 kg/m. Wt Readings from Last 3 Encounters:  07/31/22 166 lb 9.6 oz (75.6 kg)  07/26/21 161 lb 6.4 oz (73.2 kg)  07/06/21 165 lb (74.8 kg)   Gen: NAD, resting comfortably HEENT: TMs normal bilaterally. OP clear. No thyromegaly noted.  CV: RRR with no murmurs appreciated Pulm: NWOB, CTAB with no crackles, wheezes, or rhonchi GI: Normal bowel sounds present. Soft, Nontender, Nondistended. MSK: no edema, cyanosis, or clubbing noted Skin: warm, dry Neuro: CN2-12 grossly intact. Strength 5/5 in upper and lower extremities. Reflexes symmetric and intact bilaterally.  Psych: Normal affect and thought content     Tina Aguilar M. Jerline Pain, MD 07/31/2022 8:34 AM

## 2022-07-31 NOTE — Patient Instructions (Signed)
It was very nice to see you today!  We will check blood work today.  Please continue work on diet and exercise.  I will see back in year for your next physical.  Come back sooner if needed.  Take care, Dr Jerline Pain  PLEASE NOTE:  If you had any lab tests please let us know if you have not heard back within a few days. You may see your results on mychart before we have a chance to review them but we will give you a call once they are reviewed by [place]. If we ordered any referrals today, please let us know if you have not heard from their office within the next week.   Please try these tips to maintain a healthy lifestyle:  Eat at least 3 REAL meals and 1-2 snacks per day.  Aim for no more than 5 hours between eating.  If you eat breakfast, please do so within one hour of getting up.   Each meal should contain half fruits/vegetables, one quarter protein, and one quarter carbs (no bigger than a computer mouse)  Cut down on sweet beverages. This includes juice, soda, and sweet tea.   Drink at least 1 glass of water with each meal and aim for at least 8 glasses per day  Exercise at least 150 minutes every week.    Preventive Care 35 Years and Older, Female Preventive care refers to lifestyle choices and visits with your health care provider that can promote health and wellness. Preventive care visits are also called wellness exams. What can I expect for my preventive care visit? Counseling Your health care provider may ask you questions about your: Medical history, including: Past medical problems. Family medical history. Pregnancy and menstrual history. History of falls. Current health, including: Memory and ability to understand (cognition). Emotional well-being. Home life and relationship well-being. Sexual activity and sexual health. Lifestyle, including: Alcohol, nicotine or tobacco, and drug use. Access to firearms. Diet, exercise, and sleep habits. Work and work  Statistician. Sunscreen use. Safety issues such as seatbelt and bike helmet use. Physical exam Your health care provider will check your: Height and weight. These may be used to calculate your BMI (body mass index). BMI is a measurement that tells if you are at a healthy weight. Waist circumference. This measures the distance around your waistline. This measurement also tells if you are at a healthy weight and may help predict your risk of certain diseases, such as type 2 diabetes and high blood pressure. Heart rate and blood pressure. Body temperature. Skin for abnormal spots. What immunizations do I need?  Vaccines are usually given at various ages, according to a schedule. Your health care provider will recommend vaccines for you based on your age, medical history, and lifestyle or other factors, such as travel or where you work. What tests do I need? Screening Your health care provider may recommend screening tests for certain conditions. This may include: Lipid and cholesterol levels. Hepatitis C test. Hepatitis B test. HIV (human immunodeficiency virus) test. STI (sexually transmitted infection) testing, if you are at risk. Lung cancer screening. Colorectal cancer screening. Diabetes screening. This is done by checking your blood sugar (glucose) after you have not eaten for a while (fasting). Mammogram. Talk with your health care provider about how often you should have regular mammograms. BRCA-related cancer screening. This may be done if you have a family history of breast, ovarian, tubal, or peritoneal cancers. Bone density scan. This is done to screen for  osteoporosis. Talk with your health care provider about your test results, treatment options, and if necessary, the need for more tests. Follow these instructions at home: Eating and drinking  Eat a diet that includes fresh fruits and vegetables, whole grains, lean protein, and low-fat dairy products. Limit your intake of  foods with high amounts of sugar, saturated fats, and salt. Take vitamin and mineral supplements as recommended by your health care provider. Do not drink alcohol if your health care provider tells you not to drink. If you drink alcohol: Limit how much you have to 0-1 drink a day. Know how much alcohol is in your drink. In the [place] one drink equals one 12 oz bottle of beer (355 mL), one 5 oz glass of wine (148 mL), or one 1 oz glass of hard liquor (44 mL). Lifestyle Brush your teeth every morning and night with fluoride toothpaste. Floss one time each day. Exercise for at least 30 minutes 5 or more days each week. Do not use any products that contain nicotine or tobacco. These products include cigarettes, chewing tobacco, and vaping devices, such as e-cigarettes. If you need help quitting, ask your health care provider. Do not use drugs. If you are sexually active, practice safe sex. Use a condom or other form of protection in order to prevent STIs. Take aspirin only as told by your health care provider. Make sure that you understand how much to take and what form to take. Work with your health care provider to find out whether it is safe and beneficial for you to take aspirin daily. Ask your health care provider if you need to take a cholesterol-lowering medicine (statin). Find healthy ways to manage stress, such as: Meditation, yoga, or listening to music. Journaling. Talking to a trusted person. Spending time with friends and family. Minimize exposure to UV radiation to reduce your risk of skin cancer. Safety Always wear your seat belt while driving or riding in a vehicle. Do not drive: If you have been drinking alcohol. Do not ride with someone who has been drinking. When you are tired or distracted. While texting. If you have been using any mind-altering substances or drugs. Wear a helmet and other protective equipment during sports activities. If you have firearms in your house,  make sure you follow all gun safety procedures. What's next? Visit your health care provider once a year for an annual wellness visit. Ask your health care provider how often you should have your eyes and teeth checked. Stay up to date on all vaccines. This information is not intended to replace advice given to you by your health care provider. Make sure you discuss any questions you have with your health care provider. Document Revised: 03/28/2021 Document Reviewed: 03/28/2021 Elsevier Patient Education  [place].

## 2022-07-31 NOTE — Assessment & Plan Note (Signed)
She has been under quite a bit of stress recently with the declining health of her husband as well as her own physical health.  Feels like things are stabilized now.  We gave contact information for behavioral health specialist.  She will call if she has worsening stress though overall feels like things are manageable now.

## 2022-07-31 NOTE — Assessment & Plan Note (Signed)
On Vytorin 10-10 daily.  Check labs.  Discussed lifestyle modifications.

## 2022-07-31 NOTE — Assessment & Plan Note (Signed)
Continue management per orthopedics. 

## 2022-08-01 NOTE — Progress Notes (Signed)
Please inform patient of the following:  Labs are all stable.  Cholesterol is better than last year.  Do not need to make any changes to her treatment plan at this time.  She should continue to work on diet and exercise and we can recheck in a year.

## 2022-08-02 NOTE — Telephone Encounter (Signed)
Paperwork given to [place] to have patient pick up

## 2022-09-09 ENCOUNTER — Telehealth: Payer: Self-pay | Admitting: Physical Medicine and Rehabilitation

## 2022-09-09 NOTE — Telephone Encounter (Signed)
Pt called to schedule appt for back and hip injections. Please call pt at [phone]

## 2022-09-13 ENCOUNTER — Telehealth: Payer: Self-pay | Admitting: Physical Medicine and Rehabilitation

## 2022-09-13 DIAGNOSIS — M9905 Segmental and somatic dysfunction of pelvic region: Secondary | ICD-10-CM | POA: Diagnosis not present

## 2022-09-13 DIAGNOSIS — M9903 Segmental and somatic dysfunction of lumbar region: Secondary | ICD-10-CM | POA: Diagnosis not present

## 2022-09-13 DIAGNOSIS — S29012A Strain of muscle and tendon of back wall of thorax, initial encounter: Secondary | ICD-10-CM | POA: Diagnosis not present

## 2022-09-13 DIAGNOSIS — M9902 Segmental and somatic dysfunction of thoracic region: Secondary | ICD-10-CM | POA: Diagnosis not present

## 2022-09-13 DIAGNOSIS — S338XXA Sprain of other parts of lumbar spine and pelvis, initial encounter: Secondary | ICD-10-CM | POA: Diagnosis not present

## 2022-09-13 DIAGNOSIS — S39012A Strain of muscle, fascia and tendon of lower back, initial encounter: Secondary | ICD-10-CM | POA: Diagnosis not present

## 2022-09-13 NOTE — Telephone Encounter (Signed)
Patient returned call. See previous encounter

## 2022-09-13 NOTE — Telephone Encounter (Signed)
Pt called back for [place]. Pt called back with the answers to [place] questions. 1st thing last injection last 4 months states she feels injection wont last much longer. She also states still has same pains and no new injury or falls. Pt phone number is [phone].

## 2022-09-16 ENCOUNTER — Other Ambulatory Visit: Payer: Self-pay | Admitting: Physical Medicine and Rehabilitation

## 2022-09-16 ENCOUNTER — Telehealth: Payer: Self-pay | Admitting: Physical Medicine and Rehabilitation

## 2022-09-16 DIAGNOSIS — M545 Low back pain, unspecified: Secondary | ICD-10-CM

## 2022-09-16 DIAGNOSIS — M47816 Spondylosis without myelopathy or radiculopathy, lumbar region: Secondary | ICD-10-CM

## 2022-09-16 NOTE — Progress Notes (Signed)
I spoke with patient this morning to clarify requests for injections. Patient would like to repeat diagnostic medial branch blocks and she is requesting injection be performed this week.   Unfortunately, I explained to patient that we would require insurance approval prior to injection and may not be able to get her in this week for injection. I will place referral for her to have injections performed at [facility].   Patient encouraged to complete medial branch blocks and reach out to [place] if hips continue to cause her pain, could repeat bilateral hip injections.

## 2022-09-16 NOTE — Telephone Encounter (Signed)
See previous encounter

## 2022-09-16 NOTE — Telephone Encounter (Signed)
Patient called. Would like a call to schedule with Dr. Ernestina Patches.

## 2022-09-16 NOTE — Telephone Encounter (Signed)
Spoke with patient and informed her that we can schedule appointment for ablation and hip injection. She stated she is not ready to do the ablation and she wants another medial branch block. Transferred patient to Barnet Pall, NP for her to discuss the best option.

## 2022-09-18 ENCOUNTER — Other Ambulatory Visit: Payer: Self-pay | Admitting: Family Medicine

## 2022-09-18 DIAGNOSIS — E7800 Pure hypercholesterolemia, unspecified: Secondary | ICD-10-CM

## 2022-09-19 DIAGNOSIS — M9902 Segmental and somatic dysfunction of thoracic region: Secondary | ICD-10-CM | POA: Diagnosis not present

## 2022-09-19 DIAGNOSIS — S29012A Strain of muscle and tendon of back wall of thorax, initial encounter: Secondary | ICD-10-CM | POA: Diagnosis not present

## 2022-09-19 DIAGNOSIS — M9903 Segmental and somatic dysfunction of lumbar region: Secondary | ICD-10-CM | POA: Diagnosis not present

## 2022-09-19 DIAGNOSIS — S39012A Strain of muscle, fascia and tendon of lower back, initial encounter: Secondary | ICD-10-CM | POA: Diagnosis not present

## 2022-09-19 DIAGNOSIS — M9905 Segmental and somatic dysfunction of pelvic region: Secondary | ICD-10-CM | POA: Diagnosis not present

## 2022-09-19 DIAGNOSIS — S338XXA Sprain of other parts of lumbar spine and pelvis, initial encounter: Secondary | ICD-10-CM | POA: Diagnosis not present

## 2022-10-03 DIAGNOSIS — M9905 Segmental and somatic dysfunction of pelvic region: Secondary | ICD-10-CM | POA: Diagnosis not present

## 2022-10-03 DIAGNOSIS — M9903 Segmental and somatic dysfunction of lumbar region: Secondary | ICD-10-CM | POA: Diagnosis not present

## 2022-10-03 DIAGNOSIS — S29012A Strain of muscle and tendon of back wall of thorax, initial encounter: Secondary | ICD-10-CM | POA: Diagnosis not present

## 2022-10-03 DIAGNOSIS — S39012A Strain of muscle, fascia and tendon of lower back, initial encounter: Secondary | ICD-10-CM | POA: Diagnosis not present

## 2022-10-03 DIAGNOSIS — M9902 Segmental and somatic dysfunction of thoracic region: Secondary | ICD-10-CM | POA: Diagnosis not present

## 2022-10-03 DIAGNOSIS — S338XXA Sprain of other parts of lumbar spine and pelvis, initial encounter: Secondary | ICD-10-CM | POA: Diagnosis not present

## 2022-10-04 ENCOUNTER — Other Ambulatory Visit: Payer: Medicare PPO

## 2022-11-29 ENCOUNTER — Telehealth: Payer: Self-pay | Admitting: Physical Medicine and Rehabilitation

## 2022-11-29 DIAGNOSIS — M25551 Pain in right hip: Secondary | ICD-10-CM

## 2022-11-29 DIAGNOSIS — M25552 Pain in left hip: Secondary | ICD-10-CM

## 2022-11-29 NOTE — Telephone Encounter (Signed)
Patient called. She would like to schedule an appointment with Dr. Ernestina Patches. Her call back number is [phone]

## 2022-12-02 NOTE — Telephone Encounter (Signed)
LVM to return call to clinic.

## 2022-12-09 NOTE — Telephone Encounter (Signed)
Spoke with patient and scheduled bilateral hip injection for 12/11/22

## 2022-12-09 NOTE — Addendum Note (Signed)
Addended by: Casilda Carls on: 12/09/2022 08:58 AM   Modules accepted: Orders

## 2022-12-11 ENCOUNTER — Ambulatory Visit (INDEPENDENT_AMBULATORY_CARE_PROVIDER_SITE_OTHER): Payer: Medicare Other | Admitting: Physical Medicine and Rehabilitation

## 2022-12-11 ENCOUNTER — Ambulatory Visit: Payer: Self-pay

## 2022-12-11 ENCOUNTER — Encounter: Payer: Self-pay | Admitting: Physical Medicine and Rehabilitation

## 2022-12-11 DIAGNOSIS — G8929 Other chronic pain: Secondary | ICD-10-CM

## 2022-12-11 DIAGNOSIS — M545 Low back pain, unspecified: Secondary | ICD-10-CM

## 2022-12-11 DIAGNOSIS — M25552 Pain in left hip: Secondary | ICD-10-CM

## 2022-12-11 DIAGNOSIS — M25551 Pain in right hip: Secondary | ICD-10-CM

## 2022-12-11 DIAGNOSIS — M47816 Spondylosis without myelopathy or radiculopathy, lumbar region: Secondary | ICD-10-CM | POA: Diagnosis not present

## 2022-12-11 DIAGNOSIS — G894 Chronic pain syndrome: Secondary | ICD-10-CM

## 2022-12-11 MED ORDER — TRIAMCINOLONE ACETONIDE 40 MG/ML IJ SUSP
40.0000 mg | INTRAMUSCULAR | Status: AC | PRN
  Administered 2022-12-11: 40 mg via INTRA_ARTICULAR

## 2022-12-11 MED ORDER — BUPIVACAINE HCL 0.25 % IJ SOLN
5.0000 mL | INTRAMUSCULAR | Status: AC | PRN
  Administered 2022-12-11: 5 mL via INTRA_ARTICULAR

## 2022-12-11 NOTE — Progress Notes (Signed)
Tina Aguilar - 67 y.o. female MRN [id number]  Date of birth: 1955/12/17  Office Visit Note: Visit Date: 12/11/2022 PCP: Vivi Barrack, MD Referred by: Vivi Barrack, MD  Subjective: Chief Complaint  Patient presents with   Right Hip - Pain   Left Hip - Pain   Lower Back - Pain   HPI:  Tina Aguilar is a 67 y.o. female who comes in today for evaluation and management at the request of Barnet Pall, FNP for 2 distinct problems.  She complains of chronic, worsening and severe bilateral lower back pain radiating to buttocks, right greater than left.  Patient reports pain has been ongoing for several months and is exacerbated by moving from a sitting to standing position, standing and activity.  She describes her pain as a constant sore and stiff sensation, currently rates as 6 out of 10.  She endorses this pain is uncomfortable and she is able to do activities of daily living but does really limit what she can do on a daily basis.  Patient reports some relief of pain with home exercise regimen, rest and use of over-the-counter medications as needed.  Patient also reports massage therapy at [facility] and reports good temporary relief of pain with these treatments.  Patient's lumbar MRI from 2019 exhibits mild to moderate spinal canal stenosis noted at L3-L4, grade 1 anterolisthesis and severe facet hypertrophy at L4-L5, moderate to severe facet hypertrophy noted at L5-S1. No high grade spinal canal stenosis noted. Patient states her severe lower back pain makes it difficult to attend choir practice and participate in church activities. Patient states her pain is most severe when waking up in the mornings. Patient denies focal weakness, numbness and tingling. Patient denies recent trauma or falls.  She has had 2 diagnostic medial branch blocks with pain diary showing more than 80% relief of her axial back pain.  In fact she actually got uncommonly long relief with the medial  branch blocks alone.  We do see that happening at times.  She does report her pain is returning.  Her secondary issue is bilateral greater trochanteric bursa pain.  This has been a chronic pain for her for many many years.  She was followed by Dr. Joni Fears and Bevely Palmer Persons, PA-C with in office greater trochanteric injections with only minimal relief.  Fluoroscopically guided injections have given her a great deal of benefit for the bursitis.  She does not have any groin pain.  No trauma falls etc.   Review of Systems  Musculoskeletal:  Positive for back pain and joint pain.  All other systems reviewed and are negative.  Otherwise per HPI.  Assessment & Plan: Visit Diagnoses:    ICD-10-CM   1. Pain in left hip  M25.552 Large Joint Inj: bilateral hip joint    XR C-ARM NO REPORT    2. Pain in right hip  M25.551 Large Joint Inj: bilateral hip joint    XR C-ARM NO REPORT    3. Chronic bilateral low back pain without sciatica  M54.50    G89.29     4. Spondylosis without myelopathy or radiculopathy, lumbar region  M47.816     5. Chronic pain syndrome  G89.4       Plan: Findings:  1.  Pain over the lateral hips worse with palpation over the greater trochanters and consistent with greater trochanteric pain syndrome or bursitis.  This is a chronic long-term problem with her.  She may have  some underlying central sensitization pain syndrome.  Prior fluoroscopic guidance with greater trochanteric injection has been very beneficial in the past.  Will get a repeat that injection today diagnostically and therapeutically.  She will continue with home exercise program and massage.  2.  Chronic worsening severe axial low back pain consistent with facet mediated pain consisting on exam with pain with extension and facet loading and consistent with double diagnostic medial branch blocks with 80% relief with pain diary.  Uncommonly longer relief with the medial branch blocks and he would expect  that we do see this at times.  Neck step is radiofrequency ablation for more definitive longer-term care.  Will try to get that preauthorized.    Meds & Orders: No orders of the defined types were placed in this encounter.   Orders Placed This Encounter  Procedures   Large Joint Inj: bilateral hip joint   XR C-ARM NO REPORT    Follow-up: No follow-ups on file.   Procedures: Large Joint Inj: bilateral hip joint on 12/11/2022 2:26 PM Indications: diagnostic evaluation and pain Details: 22 G 3.5 in needle, fluoroscopy-guided anterior approach  Arthrogram: No  Medications (Right): 5 mL bupivacaine 0.25 %; 40 mg triamcinolone acetonide 40 MG/ML Medications (Left): 5 mL bupivacaine 0.25 %; 40 mg triamcinolone acetonide 40 MG/ML Outcome: tolerated well, no immediate complications  There was excellent flow of contrast producing a partial arthrogram of the hip. The patient did have relief of symptoms during the anesthetic phase of the injection. Procedure, treatment alternatives, risks and benefits explained, specific risks discussed. Consent was given by the patient. Immediately prior to procedure a time out was called to verify the correct patient, procedure, equipment, support staff and site/side marked as required. Patient was prepped and draped in the usual sterile fashion.          Clinical History: EXAM: MRI LUMBAR SPINE WITHOUT CONTRAST   TECHNIQUE: Multiplanar, multisequence MR imaging of the lumbar spine was performed. No intravenous contrast was administered.   COMPARISON:  Lumbar radiographs 06/22/2018. [facility] CT Abdomen and Pelvis 03/09/2017.   FINDINGS: Segmentation:  Normal on the comparisons.   Alignment: Stable since 2018. Grade 1 anterolisthesis of L4 on L5 measuring 5-6 millimeters. Subtle retrolisthesis of L1 on L2. Preserved lumbar lordosis overall.   Vertebrae: Mild degenerative appearing endplate marrow edema at the L1-L2 endplates, to a  lesser extent T12-L1 endplates. Minimal similar L5 superior endplate edema, and also possible minimal edema in the left L4 and L5 facets.   Background bone marrow signal is normal. No suspicious osseous lesion. Intact visible sacrum and SI joints.   Conus medullaris and cauda equina: Conus extends to the T12-L1 level. No lower spinal cord or conus signal abnormality.   Paraspinal and other soft tissues: Within normal limits; the left ureter in now appears decompressed compared to the 2018 CT. Negative visualized posterior paraspinal soft tissues.   Disc levels:   T10-T11: Negative.   T11-T12: Mild to moderate facet hypertrophy greater on the left. Mild to moderate left T11 foraminal stenosis.   T12-L1: Minor circumferential disc bulge. Mild facet and ligament flavum hypertrophy. Borderline to mild bilateral T12 foraminal stenosis.   L1-L2: Mild circumferential disc bulge. Mild facet hypertrophy. Borderline to mild left L1 foraminal stenosis.   L2-L3: Mild circumferential disc bulge. Mild facet hypertrophy. Borderline to mild spinal stenosis (series 5, image 24).   L3-L4: Mild circumferential disc bulge. Mild to moderate facet and ligament flavum hypertrophy. Mild to moderate spinal stenosis (  series 5, image 29) with left greater than right lateral recess stenosis (L4 nerve levels). Mild right L3 foraminal stenosis.   L4-L5: Grade 1 anterolisthesis with disc space loss and mild circumferential disc bulge/pseudo disc. Severe bilateral facet hypertrophy. Mild to moderate ligament flavum hypertrophy. Borderline to mild spinal and bilateral lateral recess stenosis (L5 nerve levels). Borderline to mild left L4 foraminal stenosis.   L5-S1: Negative disc. Moderate to severe facet hypertrophy greater on the left. No stenosis.   IMPRESSION: 1. Up to moderate multifactorial spinal stenosis at L3-L4, and up to mild stenosis at L2-L3 and L4-L5. Disc bulging at these levels with  posterior element hypertrophy which is severe at L4-L5 - due to chronic grade 1 anterolisthesis. 2. Up to severe facet degeneration also at L5-S1, but no associated stenosis at that level. 3. Similar lower thoracic disc bulging and intermittent facet hypertrophy. Up to moderate left T11 foraminal stenosis. 4. Minimal to mild degenerative appearing marrow edema at several levels, including the L1-L2 endplates, left L4-L5 facets.     Electronically Signed   By: Genevie Ann M.D.   On: 07/06/2018 08:15     Objective:  VS:  HT:    WT:   BMI:     BP:   HR: bpm  TEMP: ( )  RESP:  Physical Exam Vitals and nursing note reviewed.  Constitutional:      General: She is not in acute distress.    Appearance: Normal appearance. She is well-developed. She is not ill-appearing.  HENT:     Head: Normocephalic and atraumatic.     Right Ear: External ear normal.     Left Ear: External ear normal.  Eyes:     Extraocular Movements: Extraocular movements intact.     Conjunctiva/sclera: Conjunctivae normal.     Pupils: Pupils are equal, round, and reactive to light.  Cardiovascular:     Rate and Rhythm: Normal rate.     Pulses: Normal pulses.  Pulmonary:     Effort: Pulmonary effort is normal. No respiratory distress.  Abdominal:     General: There is no distension.     Palpations: Abdomen is soft.  Musculoskeletal:        General: Tenderness present.     Cervical back: Neck supple.     Right lower leg: No edema.     Left lower leg: No edema.     Comments: Patient has good distal strength with concordant pain over the greater trochanters.  No clonus or focal weakness.  She has pain with extension and facet loading of the lumbar spine.  She has a negative slump test bilaterally.  Skin:    General: Skin is warm and dry.     Findings: No erythema, lesion or rash.  Neurological:     General: No focal deficit present.     Mental Status: She is alert and oriented to person, place, and time.      Cranial Nerves: No cranial nerve deficit.     Sensory: No sensory deficit.     Motor: No weakness or abnormal muscle tone.     Coordination: Coordination normal.     Gait: Gait abnormal.  Psychiatric:        Mood and Affect: Mood normal.        Behavior: Behavior normal.      Imaging: No results found.

## 2022-12-11 NOTE — Progress Notes (Signed)
Functional Pain Scale - descriptive words and definitions  Uncomfortable (3)  Pain is present but can complete all ADL's/sleep is slightly affected and passive distraction only gives marginal relief. Mild range order  Average Pain 6   +Driver, -BT, -Dye Allergies.  Bilateral hip pain. Worse when going from sitting to standing

## 2022-12-12 NOTE — Addendum Note (Signed)
Addended by: Raymondo Band on: 12/12/2022 10:29 AM   Modules accepted: Orders

## 2022-12-26 ENCOUNTER — Telehealth: Payer: Self-pay

## 2022-12-26 NOTE — Telephone Encounter (Signed)
LVM to return call to reschedule 01/09/23 RFA

## 2022-12-30 ENCOUNTER — Telehealth: Payer: Self-pay | Admitting: Physical Medicine and Rehabilitation

## 2022-12-30 NOTE — Telephone Encounter (Signed)
Pt called requesting appt on 3/21. States she is unable to make it then. Please call pt at [phone].

## 2022-12-30 NOTE — Telephone Encounter (Signed)
Spoke with patient and rescheduled RFA for 01/13/23 and 01/23/23

## 2023-01-02 ENCOUNTER — Encounter: Payer: Medicare Other | Admitting: Physical Medicine and Rehabilitation

## 2023-01-09 ENCOUNTER — Encounter: Payer: Medicare Other | Admitting: Physical Medicine and Rehabilitation

## 2023-01-13 ENCOUNTER — Encounter: Payer: Medicare Other | Admitting: Physical Medicine and Rehabilitation

## 2023-01-23 ENCOUNTER — Encounter: Payer: Medicare Other | Admitting: Physical Medicine and Rehabilitation

## 2023-01-27 ENCOUNTER — Telehealth: Payer: Self-pay | Admitting: Family Medicine

## 2023-01-27 NOTE — Telephone Encounter (Signed)
Called patient to schedule Medicare Annual Wellness Visit (AWV). Left message for patient to call back and schedule Medicare Annual Wellness Visit (AWV).  Last date of AWV: 11/06/2021  Please schedule an appointment at any time with [facility].  If any questions, please contact me at [phone].  Thank you ,  Gabriel Cirri [facility] AWV TEAM Direct Dial [phone]

## 2023-01-29 ENCOUNTER — Telehealth: Payer: Self-pay | Admitting: Physical Medicine and Rehabilitation

## 2023-01-29 NOTE — Telephone Encounter (Signed)
Patient Tina Aguilar on the PT VM to cxl her appointment with Dr. Alvester Morin. She will call back when she is ready to [facility].

## 2023-01-29 NOTE — Telephone Encounter (Signed)
Appointments cancelled.

## 2023-01-30 ENCOUNTER — Encounter: Payer: Medicare Other | Admitting: Physical Medicine and Rehabilitation

## 2023-02-04 ENCOUNTER — Encounter: Payer: Medicare Other | Admitting: Physical Medicine and Rehabilitation

## 2023-02-06 ENCOUNTER — Ambulatory Visit (INDEPENDENT_AMBULATORY_CARE_PROVIDER_SITE_OTHER): Payer: Medicare Other

## 2023-02-06 VITALS — Wt 166.0 lb

## 2023-02-06 DIAGNOSIS — Z0000 Encounter for general adult medical examination without abnormal findings: Secondary | ICD-10-CM

## 2023-02-06 NOTE — Progress Notes (Signed)
I connected with  Tina Aguilar on 02/06/23 by a audio enabled telemedicine application and verified that I am speaking with the correct person using two identifiers.  Patient Location: Home  Provider Location: Office/Clinic  I discussed the limitations of evaluation and management by telemedicine. The patient expressed understanding and agreed to proceed.   Subjective:   Tina Aguilar is a 67 y.o. female who presents for Medicare Annual (Subsequent) preventive examination.  Review of Systems     Cardiac Risk Factors include: advanced age (>33men, >48 women);dyslipidemia     Objective:    Today's Vitals   02/06/23 1621  Weight: 166 lb (75.3 kg)   Body mass index is 29.41 kg/m.     02/06/2023    4:24 PM 11/06/2021    8:05 AM 03/12/2020   10:08 AM 08/10/2018   10:24 AM 05/06/2017   10:53 AM 10/16/2016    9:22 AM 08/16/2016    9:49 AM  Advanced Directives  Does Patient Have a Medical Advance Directive? No Yes No No Yes No Yes  Type of Advance Directive  Living will   Healthcare Power of Attorney  Living will  Does patient want to make changes to medical advance directive?     No - Patient declined  No - Patient declined  Copy of Healthcare Power of Attorney in Chart?     No - copy requested  No - copy requested  Would patient like information on creating a medical advance directive? No - Patient declined   No - Patient declined  No - Patient declined     Current Medications (verified) Outpatient Encounter Medications as of 02/06/2023  Medication Sig   ezetimibe-simvastatin (VYTORIN) 10-10 MG tablet TAKE ONE TABLET BY MOUTH AT BEDTIME   Multiple Vitamin (MULTIVITAMIN) capsule Take 2 capsules by mouth daily.   Naproxen Sodium (ALEVE PO) Take 1 tablet by mouth daily as needed.   Riboflavin (B-2) 100 MG TABS Take by mouth.   No facility-administered encounter medications on file as of 02/06/2023.    Allergies (verified) Seasonal ic [octacosanol]    History: Past Medical History:  Diagnosis Date   Arthritis    hips, hands   Chronic kidney disease    hx of kidney stones   Frequency of urination    History of adenomatous polyp of colon 08/28/2005   Hyperlipidemia, mixed    on meds   Left ureteral stone    Osteoarthritis    bilateral hips/lower back   Spinal stenosis    Vertigo    Wears glasses    Past Surgical History:  Procedure Laterality Date   CESAREAN SECTION  1991   and 1993   COLONOSCOPY  2012   JP-F/V-movi(exc)-normal   CYSTOSCOPY/URETEROSCOPY/HOLMIUM LASER/STENT PLACEMENT Left 05/06/2017   Procedure: CYSTOSCOPY/URETEROSCOPY/HOLMIUM LASER/STENT PLACEMENT;  Surgeon: Hildred Laser, MD;  Location: [facility];  Service: Urology;  Laterality: Left;   DILATION AND CURETTAGE OF UTERUS  1980   w/ Suction for miscarriage  x2   WISDOM TOOTH EXTRACTION  1976   Family History  Problem Relation Age of Onset   Coronary artery disease Mother    Arthritis Mother    Congestive Heart Failure Father    Heart attack Father    Hyperlipidemia Sister    Diabetes Sister    Heart disease Sister    Hyperlipidemia Sister    Hyperlipidemia Sister    Other Brother        coronary artery bypass graft age 55  Hyperlipidemia Brother    Diabetes Brother    Heart attack Maternal Aunt        at age 68   Breast cancer Neg Hx    Colon polyps Neg Hx    Colon cancer Neg Hx    Esophageal cancer Neg Hx    Rectal cancer Neg Hx    Stomach cancer Neg Hx    Social History   Socioeconomic History   Marital status: Married    Spouse name: Not on file   Number of children: Not on file   Years of education: Not on file   Highest education level: Not on file  Occupational History   Not on file  Tobacco Use   Smoking status: Former    Packs/day: 1.00    Years: 5.00    Additional pack years: 0.00    Total pack years: 5.00    Types: Cigarettes    Quit date: 04/30/1977    Years since quitting: 45.8   Smokeless  tobacco: Never  Vaping Use   Vaping Use: Never used  Substance and Sexual Activity   Alcohol use: No    Alcohol/week: 0.0 standard drinks of alcohol   Drug use: No   Sexual activity: Not on file  Other Topics Concern   Not on file  Social History Narrative   [employer], [employer] music   Married '84   Two grown daughters '91 '93   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   SO is in 70's and in good health, has RA   Social Determinants of Corporate investment banker Strain: Low Risk  (02/06/2023)   Overall Financial Resource Strain (CARDIA)    Difficulty of Paying Living Expenses: Not hard at all  Food Insecurity: No Food Insecurity (02/06/2023)   Hunger Vital Sign    Worried About Running Out of Food in the Last Year: Never true    Ran Out of Food in the Last Year: Never true  Transportation Needs: No Transportation Needs (02/06/2023)   PRAPARE - Administrator, Civil Service (Medical): No    Lack of Transportation (Non-Medical): No  Physical Activity: Sufficiently Active (02/06/2023)   Exercise Vital Sign    Days of Exercise per Week: 7 days    Minutes of Exercise per Session: 50 min  Stress: Stress Concern Present (02/06/2023)   [employer] of Occupational Health - Occupational Stress Questionnaire    Feeling of Stress : To some extent  Social Connections: Moderately Integrated (02/06/2023)   Social Connection and Isolation Panel [NHANES]    Frequency of Communication with Friends and Family: More than three times a week    Frequency of Social Gatherings with Friends and Family: More than three times a week    Attends Religious Services: More than 4 times per year    Active Member of [employer] or Organizations: No    Attends Engineer, structural: Never    Marital Status: Married    Tobacco Counseling Counseling given: Not Answered   Clinical Intake:  Pre-visit preparation completed: Yes  Pain : No/denies pain     BMI - recorded:  29.41 Nutritional Status: BMI 25 -29 Overweight Diabetes: No  How often do you need to have someone help you when you read instructions, pamphlets, or other written materials from your doctor or pharmacy?: 1 - Never  Diabetic?no  Interpreter Needed?: No  Information entered by :: Lanier Ensign, LPN   Activities of Daily Living  02/06/2023    4:25 PM  In your present state of health, do you have any difficulty performing the following activities:  Hearing? 0  Vision? 0  Difficulty concentrating or making decisions? 0  Walking or climbing stairs? 0  Dressing or bathing? 0  Doing errands, shopping? 0  Preparing Food and eating ? N  Using the Toilet? N  In the past six months, have you accidently leaked urine? N  Do you have problems with loss of bowel control? N  Managing your Medications? N  Managing your Finances? N  Housekeeping or managing your Housekeeping? N    Patient Care Team: Ardith Dark, MD as PCP - General (Family Medicine) Valeria Batman, MD (Inactive) as Consulting Physician (Orthopedic Surgery)  Indicate any recent Medical Services you may have received from other than [facility] providers in the past year (date may be approximate).     Assessment:   This is a routine wellness examination for Areal.  Hearing/Vision screen Hearing Screening - Comments:: Pt denies any hearing issues  Vision Screening - Comments:: Pt follows up with [facility] for annual eye exams   Dietary issues and exercise activities discussed: Current Exercise Habits: Home exercise routine, Type of exercise: walking, Time (Minutes): 50, Frequency (Times/Week): 7, Weekly Exercise (Minutes/Week): 350   Goals Addressed             This Visit's Progress    Patient Stated       Lose weight        Depression Screen    02/06/2023    4:22 PM 07/31/2022    8:02 AM 11/06/2021    8:04 AM 07/26/2021    9:31 AM 07/06/2021   10:23 AM 07/07/2019    1:25 PM 05/27/2017   10:14 AM   PHQ 2/9 Scores  PHQ - 2 Score 0 0 0 0 0 0 0  PHQ- 9 Score      3     Fall Risk    02/06/2023    4:25 PM 07/31/2022    8:02 AM 11/06/2021    8:06 AM 07/26/2021    9:31 AM 05/27/2017   10:14 AM  Fall Risk   Falls in the past year? 0 0 0 0 No  Number falls in past yr: 0 0 0 0   Injury with Fall? 0 0 0 0   Risk for fall due to : Impaired vision;Impaired balance/gait;Impaired mobility No Fall Risks Impaired vision;Impaired balance/gait;Impaired mobility    Risk for fall due to: Comment   due to the hip and back pain    Follow up Falls prevention discussed        FALL RISK PREVENTION PERTAINING TO THE HOME:  Any stairs in or around the home? No  If so, are there any without handrails? No  Home free of loose throw rugs in walkways, pet beds, electrical cords, etc? Yes  Adequate lighting in your home to reduce risk of falls? Yes   ASSISTIVE DEVICES UTILIZED TO PREVENT FALLS:  Life alert? Yes  Use of a cane, walker or w/c? No  Grab bars in the bathroom? Yes  Shower chair or bench in shower? Yes  Elevated toilet seat or a handicapped toilet? Yes   TIMED UP AND GO:  Was the test performed? No .  Cognitive Function:        02/06/2023    4:26 PM 11/06/2021    8:11 AM  6CIT Screen  What Year? 0 points 0 points  What  month? 0 points 0 points  What time? 0 points 0 points  Count back from 20 0 points 0 points  Months in reverse 0 points 0 points  Repeat phrase 0 points 0 points  Total Score 0 points 0 points    Immunizations Immunization History  Administered Date(s) Administered   PFIZER(Purple Top)SARS-COV-2 Vaccination 12/27/2019, 01/19/2020, 08/13/2020, 04/30/2021   PPD Test 04/02/2022   Pneumococcal Conjugate-13 07/20/2020   Pneumococcal Polysaccharide-23 07/26/2021   Tdap 05/25/2014   Zoster Recombinat (Shingrix) 05/27/2017, 11/13/2017    TDAP status: Up to date  Flu Vaccine status: Declined, Education has been provided regarding the importance of this vaccine  but patient still declined. Advised may receive this vaccine at local pharmacy or Health Dept. Aware to provide a copy of the vaccination record if obtained from local pharmacy or Health Dept. Verbalized acceptance and understanding.  Pneumococcal vaccine status: Up to date  Covid-19 vaccine status: Completed vaccines  Qualifies for Shingles Vaccine? Yes   Zostavax completed Yes   Shingrix Completed?: Yes  Screening Tests Health Maintenance  Topic Date Due   COVID-19 Vaccine (5 - 2023-24 season) 06/14/2022   Medicare Annual Wellness (AWV)  02/06/2024   DTaP/Tdap/Td (2 - Td or Tdap) 05/25/2024   MAMMOGRAM  07/05/2024   COLONOSCOPY (Pts 45-43yrs Insurance coverage will need to be confirmed)  06/28/2031   Pneumonia Vaccine 30+ Years old  Completed   DEXA SCAN  Completed   Hepatitis C Screening  Completed   Zoster Vaccines- Shingrix  Completed   HPV VACCINES  Aged Out   INFLUENZA VACCINE  Discontinued    Health Maintenance  Health Maintenance Due  Topic Date Due   COVID-19 Vaccine (5 - 2023-24 season) 06/14/2022    Colorectal cancer screening: Type of screening: Colonoscopy. Completed 06/27/21. Repeat every 10 years  Mammogram status: Completed 07/05/22. Repeat every year  Bone Density status: Completed 07/16/20. Results reflect: Bone density results: OSTEOPENIA. Repeat every 2 years.   Additional Screening:  Hepatitis C Screening:  Completed 04/17/18  Vision Screening: Recommended annual ophthalmology exams for early detection of glaucoma and other disorders of the eye. Is the patient up to date with their annual eye exam?  Yes  Who is the provider or what is the name of the office in which the patient attends annual eye exams? [facility]  If pt is not established with a provider, would they like to be referred to a provider to establish care? No .   Dental Screening: Recommended annual dental exams for proper oral hygiene  Community Resource Referral / Chronic Care  Management: CRR required this visit?  No   CCM required this visit?  No      Plan:     I have personally reviewed and noted the following in the patient's chart:   Medical and social history Use of alcohol, tobacco or illicit drugs  Current medications and supplements including opioid prescriptions. Patient is not currently taking opioid prescriptions. Functional ability and status Nutritional status Physical activity Advanced directives List of other physicians Hospitalizations, surgeries, and ER visits in previous 12 months Vitals Screenings to include cognitive, depression, and falls Referrals and appointments  In addition, I have reviewed and discussed with patient certain preventive protocols, quality metrics, and best practice recommendations. A written personalized care plan for preventive services as well as general preventive health recommendations were provided to patient.     Marzella Schlein, LPN   06/11/5620   Nurse Notes: none

## 2023-02-07 ENCOUNTER — Telehealth: Payer: Self-pay | Admitting: Family Medicine

## 2023-02-07 NOTE — Telephone Encounter (Signed)
Patient dropped off document  VA Assistance , to be filled out by provider. Patient requested to send it via Call Patient to pick up within ASAP. Document is located in providers tray at front office.Please advise at Mobile [phone] (mobile)

## 2023-02-10 NOTE — Telephone Encounter (Signed)
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

## 2023-02-11 NOTE — Telephone Encounter (Signed)
Please schedule an OV for form completion

## 2023-02-11 NOTE — Telephone Encounter (Signed)
Unable to reach pt , lvm for her to cb to get scheduled  

## 2023-02-14 ENCOUNTER — Encounter: Payer: Self-pay | Admitting: Family Medicine

## 2023-02-14 ENCOUNTER — Ambulatory Visit (INDEPENDENT_AMBULATORY_CARE_PROVIDER_SITE_OTHER): Payer: Medicare Other | Admitting: Family Medicine

## 2023-02-14 VITALS — BP 125/85 | HR 74 | Temp 97.7°F | Ht 63.0 in | Wt 168.0 lb

## 2023-02-14 DIAGNOSIS — E7800 Pure hypercholesterolemia, unspecified: Secondary | ICD-10-CM

## 2023-02-14 DIAGNOSIS — F439 Reaction to severe stress, unspecified: Secondary | ICD-10-CM

## 2023-02-14 NOTE — Assessment & Plan Note (Signed)
On Vytorin 10-10 daily.  Doing well.  Last lipid panel with LDL 121.  Recheck again later this year.

## 2023-02-14 NOTE — Progress Notes (Signed)
   Tina Aguilar is a 67 y.o. female who presents today for an office visit.  Assessment/Plan:  New/Acute Problems: Encounter for form completion Form completed in office today.  Will scanned this into media tab.  Chronic Problems Addressed Today: Hyperlipidemia On Vytorin 10-10 daily.  Doing well.  Last lipid panel with LDL 121.  Recheck again later this year.  Stress She has been under more stress due to recent move and declining health of her husband.  Overall symptoms are stable.     Subjective:  HPI:  See A/p for status of chronic conditions.  She is here today for form completion. She recently moved into Assisted living facility with her husband. Her husband is getting VA benefits and they require a form for her to continue to stay with him.        Objective:  Physical Exam: BP 125/85   Pulse 74   Temp 97.7 F (36.5 C) (Temporal)   Ht 5\' 3"  (1.6 m)   Wt 168 lb (76.2 kg)   SpO2 97%   BMI 29.76 kg/m   Gen: No acute distress, resting comfortably CV: Regular rate and rhythm with no murmurs appreciated Pulm: Normal work of breathing, clear to auscultation bilaterally with no crackles, wheezes, or rhonchi Neuro: Grossly normal, moves all extremities Psych: Normal affect and thought content  Time Spent: 30 minutes of total time was spent on the date of the encounter performing the following actions: chart review prior to seeing the patient, obtaining history, performing a medically necessary exam, completing her form for the [place], counseling on the treatment plan, placing orders, and documenting in our EHR.         Katina Degree. [name], MD 02/14/2023 11:08 AM

## 2023-02-14 NOTE — Assessment & Plan Note (Signed)
She has been under more stress due to recent move and declining health of her husband.  Overall symptoms are stable.

## 2023-02-14 NOTE — Patient Instructions (Addendum)
It was very nice to see you today!  We will complete your form today.   Return if symptoms worsen or fail to improve.   Take care, Dr [name]  PLEASE NOTE:  If you had any lab tests, please let us know if you have not heard back within a few days. You may see your results on mychart before we have a chance to review them but we will give you a call once they are reviewed by [place].   If we ordered any referrals today, please let us know if you have not heard from their office within the next week.   If you had any urgent prescriptions sent in today, please check with the pharmacy within an hour of our visit to make sure the prescription was transmitted appropriately.   Please try these tips to maintain a healthy lifestyle:  Eat at least 3 REAL meals and 1-2 snacks per day.  Aim for no more than 5 hours between eating.  If you eat breakfast, please do so within one hour of getting up.   Each meal should contain half fruits/vegetables, one quarter protein, and one quarter carbs (no bigger than a computer mouse)  Cut down on sweet beverages. This includes juice, soda, and sweet tea.   Drink at least 1 glass of water with each meal and aim for at least 8 glasses per day  Exercise at least 150 minutes every week.

## 2023-03-12 ENCOUNTER — Telehealth: Payer: Self-pay | Admitting: Physical Medicine and Rehabilitation

## 2023-03-12 NOTE — Telephone Encounter (Signed)
Patient want bilateral hip injections and nerve ablation. Please advise

## 2023-03-17 NOTE — Telephone Encounter (Signed)
Spoke with patient and she states she is no longer having pain in her hips and back. She states she will call back if the pain returns

## 2023-04-07 ENCOUNTER — Telehealth: Payer: Self-pay | Admitting: Physical Medicine and Rehabilitation

## 2023-04-07 DIAGNOSIS — M25551 Pain in right hip: Secondary | ICD-10-CM

## 2023-04-07 DIAGNOSIS — M25552 Pain in left hip: Secondary | ICD-10-CM

## 2023-04-07 NOTE — Telephone Encounter (Signed)
Patient called. Would like an appointment with Dr. Newton 

## 2023-04-11 NOTE — Telephone Encounter (Signed)
Spoke with patient and she is requesting bilateral hip injections. Scheduled for 05/01/23

## 2023-04-16 ENCOUNTER — Telehealth: Payer: Self-pay | Admitting: *Deleted

## 2023-04-16 NOTE — Telephone Encounter (Signed)
[facility] recommendation form  Sign and Faxed to [phone]

## 2023-05-01 ENCOUNTER — Other Ambulatory Visit: Payer: Self-pay

## 2023-05-01 ENCOUNTER — Ambulatory Visit (INDEPENDENT_AMBULATORY_CARE_PROVIDER_SITE_OTHER): Payer: Medicare Other | Admitting: Physical Medicine and Rehabilitation

## 2023-05-01 DIAGNOSIS — M25551 Pain in right hip: Secondary | ICD-10-CM | POA: Diagnosis not present

## 2023-05-01 DIAGNOSIS — M25552 Pain in left hip: Secondary | ICD-10-CM

## 2023-05-01 NOTE — Progress Notes (Signed)
Tina Aguilar - 67 y.o. female MRN [id number]  Date of birth: June 11, 1956  Office Visit Note: Visit Date: 05/01/2023 PCP: Ardith Dark, MD Referred by: Ardith Dark, MD  Subjective: Chief Complaint  Patient presents with   Right Hip - Pain   Left Hip - Pain   HPI:  Tina Aguilar is a 67 y.o. female who comes in today for planned repeat Bilateral anesthetic hip arthrogram with fluoroscopic guidance.  The patient has failed conservative care including home exercise, medications, time and activity modification. Prior injection gave more than 50% relief for several months. This injection will be diagnostic and hopefully therapeutic.  Please see requesting physician notes for further details and justification.  Referring: Ellin Goodie, FNP   ROS Otherwise per HPI.  Assessment & Plan: Visit Diagnoses:    ICD-10-CM   1. Pain in left hip  M25.552 Large Joint Inj: bilateral hip joint    XR C-ARM NO REPORT    2. Pain in right hip  M25.551 Large Joint Inj: bilateral hip joint    XR C-ARM NO REPORT      Plan: No additional findings.   Meds & Orders: No orders of the defined types were placed in this encounter.   Orders Placed This Encounter  Procedures   Large Joint Inj: bilateral hip joint   XR C-ARM NO REPORT    Follow-up: No follow-ups on file.   Procedures: Large Joint Inj: bilateral hip joint on 05/01/2023 9:49 AM Indications: diagnostic evaluation and pain Details: 22 G 3.5 in needle, fluoroscopy-guided anterior approach  Arthrogram: No  Medications (Right): 40 mg triamcinolone acetonide 40 MG/ML Medications (Left): 40 mg triamcinolone acetonide 40 MG/ML Outcome: tolerated well, no immediate complications  There was excellent flow of contrast producing a partial arthrogram of the hip. The patient did have relief of symptoms during the anesthetic phase of the injection. Procedure, treatment alternatives, risks and benefits explained, specific  risks discussed. Consent was given by the patient. Immediately prior to procedure a time out was called to verify the correct patient, procedure, equipment, support staff and site/side marked as required. Patient was prepped and draped in the usual sterile fashion.          Clinical History: EXAM: MRI LUMBAR SPINE WITHOUT CONTRAST   TECHNIQUE: Multiplanar, multisequence MR imaging of the lumbar spine was performed. No intravenous contrast was administered.   COMPARISON:  Lumbar radiographs 06/22/2018. [facility] CT Abdomen and Pelvis 03/09/2017.   FINDINGS: Segmentation:  Normal on the comparisons.   Alignment: Stable since 2018. Grade 1 anterolisthesis of L4 on L5 measuring 5-6 millimeters. Subtle retrolisthesis of L1 on L2. Preserved lumbar lordosis overall.   Vertebrae: Mild degenerative appearing endplate marrow edema at the L1-L2 endplates, to a lesser extent T12-L1 endplates. Minimal similar L5 superior endplate edema, and also possible minimal edema in the left L4 and L5 facets.   Background bone marrow signal is normal. No suspicious osseous lesion. Intact visible sacrum and SI joints.   Conus medullaris and cauda equina: Conus extends to the T12-L1 level. No lower spinal cord or conus signal abnormality.   Paraspinal and other soft tissues: Within normal limits; the left ureter in now appears decompressed compared to the 2018 CT. Negative visualized posterior paraspinal soft tissues.   Disc levels:   T10-T11: Negative.   T11-T12: Mild to moderate facet hypertrophy greater on the left. Mild to moderate left T11 foraminal stenosis.   T12-L1: Minor circumferential disc bulge. Mild facet  and ligament flavum hypertrophy. Borderline to mild bilateral T12 foraminal stenosis.   L1-L2: Mild circumferential disc bulge. Mild facet hypertrophy. Borderline to mild left L1 foraminal stenosis.   L2-L3: Mild circumferential disc bulge. Mild facet  hypertrophy. Borderline to mild spinal stenosis (series 5, image 24).   L3-L4: Mild circumferential disc bulge. Mild to moderate facet and ligament flavum hypertrophy. Mild to moderate spinal stenosis (series 5, image 29) with left greater than right lateral recess stenosis (L4 nerve levels). Mild right L3 foraminal stenosis.   L4-L5: Grade 1 anterolisthesis with disc space loss and mild circumferential disc bulge/pseudo disc. Severe bilateral facet hypertrophy. Mild to moderate ligament flavum hypertrophy. Borderline to mild spinal and bilateral lateral recess stenosis (L5 nerve levels). Borderline to mild left L4 foraminal stenosis.   L5-S1: Negative disc. Moderate to severe facet hypertrophy greater on the left. No stenosis.   IMPRESSION: 1. Up to moderate multifactorial spinal stenosis at L3-L4, and up to mild stenosis at L2-L3 and L4-L5. Disc bulging at these levels with posterior element hypertrophy which is severe at L4-L5 - due to chronic grade 1 anterolisthesis. 2. Up to severe facet degeneration also at L5-S1, but no associated stenosis at that level. 3. Similar lower thoracic disc bulging and intermittent facet hypertrophy. Up to moderate left T11 foraminal stenosis. 4. Minimal to mild degenerative appearing marrow edema at several levels, including the L1-L2 endplates, left L4-L5 facets.     Electronically Signed   By: Odessa Fleming M.D.   On: 07/06/2018 08:15     Objective:  VS:  HT:    WT:   BMI:     BP:   HR: bpm  TEMP: ( )  RESP:  Physical Exam   Imaging: No results found.

## 2023-05-01 NOTE — Progress Notes (Signed)
Functional Pain Scale - descriptive words and definitions  Uncomfortable (3)  Pain is present but can complete all ADL's/sleep is slightly affected and passive distraction only gives marginal relief. Mild range order  Average Pain  varies, but can be 5-6   +Driver, -BT, -Dye Allergies.  Pain in both hips

## 2023-05-13 MED ORDER — TRIAMCINOLONE ACETONIDE 40 MG/ML IJ SUSP
40.0000 mg | INTRAMUSCULAR | Status: AC | PRN
  Administered 2023-05-01: 40 mg via INTRA_ARTICULAR

## 2023-05-16 ENCOUNTER — Telehealth: Payer: Self-pay | Admitting: Family Medicine

## 2023-05-16 NOTE — Telephone Encounter (Signed)
Patient dropped off document FL2, to be filled out by provider. Patient requested to send it back via Fax [phone] 5-days. Document is located in providers tray at front office.Please advise

## 2023-05-19 DIAGNOSIS — Z0279 Encounter for issue of other medical certificate: Secondary | ICD-10-CM

## 2023-05-19 NOTE — Telephone Encounter (Signed)
Form placed to be reviewed in PCP desk

## 2023-05-20 NOTE — Telephone Encounter (Signed)
FL2 form faxed to [phone] on 05/19/2023 Form placed to be faxed on patient chart

## 2023-06-24 ENCOUNTER — Telehealth: Payer: Self-pay | Admitting: Physical Medicine and Rehabilitation

## 2023-06-24 ENCOUNTER — Other Ambulatory Visit: Payer: Self-pay | Admitting: Physical Medicine and Rehabilitation

## 2023-06-24 ENCOUNTER — Telehealth: Payer: Self-pay | Admitting: Family Medicine

## 2023-06-24 ENCOUNTER — Encounter: Payer: Self-pay | Admitting: Family Medicine

## 2023-06-24 ENCOUNTER — Ambulatory Visit (INDEPENDENT_AMBULATORY_CARE_PROVIDER_SITE_OTHER): Payer: Medicare Other | Admitting: Family Medicine

## 2023-06-24 VITALS — BP 132/83 | HR 75 | Temp 97.6°F | Ht 63.0 in | Wt 167.0 lb

## 2023-06-24 DIAGNOSIS — R1031 Right lower quadrant pain: Secondary | ICD-10-CM | POA: Diagnosis not present

## 2023-06-24 DIAGNOSIS — R1030 Lower abdominal pain, unspecified: Secondary | ICD-10-CM

## 2023-06-24 DIAGNOSIS — E7800 Pure hypercholesterolemia, unspecified: Secondary | ICD-10-CM | POA: Diagnosis not present

## 2023-06-24 DIAGNOSIS — M160 Bilateral primary osteoarthritis of hip: Secondary | ICD-10-CM | POA: Diagnosis not present

## 2023-06-24 NOTE — Telephone Encounter (Signed)
Patient requests to be called re: Patient was referred to Dr. Thea Gist. Patient requests Dr. Lavone Neri opinion.

## 2023-06-24 NOTE — Patient Instructions (Signed)
It was very nice to see you today!  I think pain is probably coming from your hip.  We will check a urine culture and a CT scan to look for other possible causes.  Please let us know if you have any change in symptoms.  Return if symptoms worsen or fail to improve.   Take care, Dr [name]  PLEASE NOTE:  If you had any lab tests, please let us know if you have not heard back within a few days. You may see your results on mychart before we have a chance to review them but we will give you a call once they are reviewed by [place].   If we ordered any referrals today, please let us know if you have not heard from their office within the next week.   If you had any urgent prescriptions sent in today, please check with the pharmacy within an hour of our visit to make sure the prescription was transmitted appropriately.   Please try these tips to maintain a healthy lifestyle:  Eat at least 3 REAL meals and 1-2 snacks per day.  Aim for no more than 5 hours between eating.  If you eat breakfast, please do so within one hour of getting up.   Each meal should contain half fruits/vegetables, one quarter protein, and one quarter carbs (no bigger than a computer mouse)  Cut down on sweet beverages. This includes juice, soda, and sweet tea.   Drink at least 1 glass of water with each meal and aim for at least 8 glasses per day  Exercise at least 150 minutes every week.

## 2023-06-24 NOTE — Telephone Encounter (Signed)
He is a Tax adviser and she will be in good hands with him.  Tina Aguilar. [name], MD 06/24/2023 3:02 PM

## 2023-06-24 NOTE — Progress Notes (Signed)
   Chauntia Rico is a 67 y.o. female who presents today for an office visit.  Assessment/Plan:  New/Acute Problems: RLQ Abdominal Pain No red flags.  Overall reassuring exam.  She has tenderness on palpation of right iliac crest as well as elicited pain with resisted hip flexion.  Her symptoms are most likely coming from hip flexor weakness and strain.  However given that symptoms have been persistent for the last 6 months would be reasonable for [place] to check a CT scan at this point to further evaluate.  She will be addressing her right hip pain as below with orthopedics.  Urinary Frequency May be due to mild OAB.  Symptoms are currently manageable though we will check UA and urine culture to rule out UTI other possible causes.  Chronic Problems Addressed Today: Osteoarthritis Following with orthopedics.  Having more pain in her right hip.  Takes Aleve as needed which works modestly well.  She does have quite a bit of hip flexor pain and weakness today which is likely related to her osteoarthritis.  She will be following up with orthopedics soon and considering possible hip replacement.  Hyperlipidemia On Vytorin 10-10 daily.  Doing well.  Last lipid panel with LDL 121.  Will recheck at next CPE.     Subjective:  HPI:  See Assessment / plan for status of chronic conditions.    Her primary concern today is right lower abdominal pain. Sometimes feels achey sometimes sharp. Comes and goes. This started about 6 months. Sometimes some nausea. No vomiting. No constipation or diarrhea. Normal appetite. No unintentional weight loss. Tried taking aleve with some improvement. No obvious triggers or precipitating events. Some mild urinary frequency.  Occasional discomfort with urination.  No dysuria.  No fevers or chills.  She is also having ongoing issues with right hip pain due to osteoarthritis.  She has been following with orthopedics for this and is considering having hip replacement  surgery in the near future.  Aleve does help with this.       Objective:  Physical Exam: BP 132/83   Pulse 75   Temp 97.6 F (36.4 C) (Temporal)   Ht 5\' 3"  (1.6 m)   Wt 167 lb (75.8 kg)   SpO2 99%   BMI 29.58 kg/m   Wt Readings from Last 3 Encounters:  06/24/23 167 lb (75.8 kg)  02/14/23 168 lb (76.2 kg)  02/06/23 166 lb (75.3 kg)    Gen: No acute distress, resting comfortably CV: Regular rate and rhythm with no murmurs appreciated Pulm: Normal work of breathing, clear to auscultation bilaterally with no crackles, wheezes, or rhonchi ZO:XWRU, nontender, nondistended.  Bowel sounds present.  Tenderness to palpation along anterior spine of right iliac crest. MUSCULOSKELETAL: Pain elicited with resisted right hip flexion. Neuro: Grossly normal, moves all extremities Psych: Normal affect and thought content      Aili Casillas M. [name], MD 06/24/2023 10:48 AM

## 2023-06-24 NOTE — Assessment & Plan Note (Signed)
On Vytorin 10-10 daily.  Doing well.  Last lipid panel with LDL 121.  Will recheck at next CPE.

## 2023-06-24 NOTE — Assessment & Plan Note (Signed)
Following with orthopedics.  Having more pain in her right hip.  Takes Aleve as needed which works modestly well.  She does have quite a bit of hip flexor pain and weakness today which is likely related to her osteoarthritis.  She will be following up with orthopedics soon and considering possible hip replacement.

## 2023-06-24 NOTE — Telephone Encounter (Signed)
Patient notified PCP information

## 2023-06-24 NOTE — Telephone Encounter (Signed)
See note

## 2023-06-24 NOTE — Telephone Encounter (Signed)
Patient called advised she  is ready to have a hip replacement and wanted to be referred to discuss surgery with one of our providers.  Patient said she was a former patient of Dr. Hoy Register. The number to contact patient is [phone]

## 2023-06-25 LAB — URINE CULTURE
MICRO NUMBER:: 15446009
SPECIMEN QUALITY:: ADEQUATE

## 2023-06-26 NOTE — Progress Notes (Signed)
Urine culture negative with no signs of UTI.

## 2023-07-01 ENCOUNTER — Ambulatory Visit: Payer: Medicare Other | Admitting: Orthopaedic Surgery

## 2023-07-02 ENCOUNTER — Other Ambulatory Visit: Payer: Self-pay | Admitting: Family Medicine

## 2023-07-02 DIAGNOSIS — Z0000 Encounter for general adult medical examination without abnormal findings: Secondary | ICD-10-CM

## 2023-07-06 ENCOUNTER — Ambulatory Visit (HOSPITAL_BASED_OUTPATIENT_CLINIC_OR_DEPARTMENT_OTHER)
Admission: RE | Admit: 2023-07-06 | Discharge: 2023-07-06 | Disposition: A | Discharge status: Home / Self Care | Payer: Medicare Other | Source: Ambulatory Visit | Attending: Family Medicine | Admitting: Family Medicine

## 2023-07-06 DIAGNOSIS — R1031 Right lower quadrant pain: Secondary | ICD-10-CM | POA: Insufficient documentation

## 2023-07-06 MED ORDER — IOHEXOL 300 MG/ML  SOLN
100.0000 mL | Freq: Once | INTRAMUSCULAR | Status: AC | PRN
  Administered 2023-07-06: 100 mL via INTRAVENOUS

## 2023-07-09 ENCOUNTER — Encounter: Payer: Self-pay | Admitting: Orthopaedic Surgery

## 2023-07-09 ENCOUNTER — Ambulatory Visit (INDEPENDENT_AMBULATORY_CARE_PROVIDER_SITE_OTHER): Payer: Medicare Other | Admitting: Orthopaedic Surgery

## 2023-07-09 DIAGNOSIS — M1611 Unilateral primary osteoarthritis, right hip: Secondary | ICD-10-CM | POA: Insufficient documentation

## 2023-07-09 DIAGNOSIS — M1612 Unilateral primary osteoarthritis, left hip: Secondary | ICD-10-CM | POA: Diagnosis not present

## 2023-07-09 NOTE — Progress Notes (Signed)
The patient is a very pleasant and active 67 year old female who comes in today to discuss hip replacement surgery.  She is a long and established patient of the practice.  She has been getting steroid injections in her hips by Dr. Alvester Morin under fluoroscopy on occasion.  She has well-documented severe arthritis in both hips with the right worse than the left.  She is at the point where she would like to consider hip replacement surgery.  She did ask about the potential for surgery the first week of October but she understands that is too close and so she wants to consider this in early January.  She would like to have Dr. Alvester Morin consider 1 more set of injections in her hips and October of this year and I think that is absolutely reasonable with still proceeding with surgery and January.  She is otherwise a healthy individual.  She has been dealing with hip pain for many years now with both hips.  Again the right hurts worse than left.  She is very stiff with both hips.  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  I was able to review all of her medications and past medical history within epic and see all of her other notes.  Also reviewed with her plain films of her pelvis and both hips and she even had a CT scan yesterday due to some right lower quadrant pain and I was able to see her hips on those studies.  On exam both her hips are significantly stiff with minimal internal and external rotation.  The left one moves little bit better than the right hip.  They both have pain in the groin with rotation.  This is affecting her posture and her back as well.  All the imaging studies were reviewed with her and it does show significant arthritis in both hips with large osteophytes surrounding both hips more so on the right than the left and there is also significant right hip joint space narrowing.  We had a long and thorough discussion about hip  replacement surgery.  I showed her a hip replacement model and went over her imaging studies.  I gave her handout about hip replacement surgery and described the risks and benefits of the surgery and what to expect from an intraoperative and postoperative standpoint.  Again I am fine with her having steroid injections in October with considering a right hip replacement and sometime in January 2025.  She has our surgery scheduler's card and she will give [place] a call to have this scheduled.

## 2023-07-10 NOTE — Progress Notes (Signed)
Her CT scan was overall reassuring.  She does have a small amount of calcification in her aorta which is normal for her age.  She also has a small hernia but should not cause her any issues.  Do not need to make any changes to her treatment plan based on this however she should let us know if her pain is not improving.

## 2023-07-16 ENCOUNTER — Telehealth: Payer: Self-pay | Admitting: Physical Medicine and Rehabilitation

## 2023-07-16 DIAGNOSIS — M25552 Pain in left hip: Secondary | ICD-10-CM

## 2023-07-16 DIAGNOSIS — M25551 Pain in right hip: Secondary | ICD-10-CM

## 2023-07-16 NOTE — Telephone Encounter (Signed)
Patient called needing to schedule and appointment with Dr. Alvester Morin for an injection in her hip.  The number to contact patient is [phone]

## 2023-07-17 ENCOUNTER — Telehealth: Payer: Self-pay

## 2023-07-17 NOTE — Telephone Encounter (Signed)
I called patient and left voice mail returning her call to schedule surgery for January.

## 2023-07-18 ENCOUNTER — Ambulatory Visit
Admission: RE | Admit: 2023-07-18 | Discharge: 2023-07-18 | Disposition: A | Discharge status: Home / Self Care | Payer: Medicare Other | Source: Ambulatory Visit | Attending: Family Medicine | Admitting: Family Medicine

## 2023-07-18 DIAGNOSIS — Z0000 Encounter for general adult medical examination without abnormal findings: Secondary | ICD-10-CM

## 2023-07-21 NOTE — Telephone Encounter (Signed)
Spoke with patient and she is wanting bilateral hip injections. She stated she is getting hip replacement surgery in January but was told she has to wait 90 days in between injection and surgery. She wants to have the injection the last week in October or first week in November. Is that okay?

## 2023-07-22 NOTE — Telephone Encounter (Signed)
Spoke with patient and scheduled injection for 08/14/23.

## 2023-07-22 NOTE — Addendum Note (Signed)
Addended by: Sharlet Salina on: 07/22/2023 09:17 AM   Modules accepted: Orders

## 2023-08-14 ENCOUNTER — Ambulatory Visit: Payer: Medicare Other | Admitting: Physical Medicine and Rehabilitation

## 2023-08-14 ENCOUNTER — Other Ambulatory Visit: Payer: Self-pay

## 2023-08-14 DIAGNOSIS — M25551 Pain in right hip: Secondary | ICD-10-CM | POA: Diagnosis not present

## 2023-08-14 DIAGNOSIS — M25552 Pain in left hip: Secondary | ICD-10-CM

## 2023-08-14 MED ORDER — BUPIVACAINE HCL 0.5 % IJ SOLN
5.0000 mL | INTRAMUSCULAR | Status: AC | PRN
Start: 2023-08-14 — End: 2023-08-14
  Administered 2023-08-14: 5 mL via INTRA_ARTICULAR

## 2023-08-14 MED ORDER — TRIAMCINOLONE ACETONIDE 40 MG/ML IJ SUSP
40.0000 mg | INTRAMUSCULAR | Status: AC | PRN
Start: 2023-08-14 — End: 2023-08-14
  Administered 2023-08-14: 40 mg via INTRA_ARTICULAR

## 2023-08-14 NOTE — Progress Notes (Signed)
Tina Aguilar - 67 y.o. female MRN [id number]  Date of birth: [birth year]  Office Visit Note: Visit Date: 08/14/2023 PCP: Ardith Dark, MD Referred by: Ardith Dark, MD  Subjective: Chief Complaint  Patient presents with   Lower Back - Pain   HPI:  Tina Aguilar is a 67 y.o. female who comes in today for planned repeat Bilateral anesthetic hip arthrogram with fluoroscopic guidance.  The patient has failed conservative care including home exercise, medications, time and activity modification. Prior injection gave more than 50% relief for several months. This injection will be diagnostic and hopefully therapeutic.  Please see requesting physician notes for further details and justification. Likely having THA in January.  Referring: Dr. Doneen Poisson   ROS Otherwise per HPI.  Assessment & Plan: Visit Diagnoses:    ICD-10-CM   1. Pain in left hip  M25.552 XR C-ARM NO REPORT    Large Joint Inj: bilateral hip joint    2. Pain in right hip  M25.551 XR C-ARM NO REPORT    Large Joint Inj: bilateral hip joint      Plan: No additional findings.   Meds & Orders: No orders of the defined types were placed in this encounter.   Orders Placed This Encounter  Procedures   Large Joint Inj: bilateral hip joint   XR C-ARM NO REPORT    Follow-up: Return if symptoms worsen or fail to improve.   Procedures: Large Joint Inj: bilateral hip joint on 08/14/2023 4:24 PM Indications: diagnostic evaluation and pain Details: 22 G 3.5 in needle, fluoroscopy-guided anterior approach  Arthrogram: No  Medications (Right): 5 mL bupivacaine 0.5 %; 40 mg triamcinolone acetonide 40 MG/ML Medications (Left): 5 mL bupivacaine 0.5 %; 40 mg triamcinolone acetonide 40 MG/ML Outcome: tolerated well, no immediate complications  There was excellent flow of contrast producing a partial arthrogram of the hip. The patient did have relief of symptoms during the anesthetic phase of  the injection. Procedure, treatment alternatives, risks and benefits explained, specific risks discussed. Consent was given by the patient. Immediately prior to procedure a time out was called to verify the correct patient, procedure, equipment, support staff and site/side marked as required. Patient was prepped and draped in the usual sterile fashion.          Clinical History: EXAM: MRI LUMBAR SPINE WITHOUT CONTRAST   TECHNIQUE: Multiplanar, multisequence MR imaging of the lumbar spine was performed. No intravenous contrast was administered.   COMPARISON:  Lumbar radiographs 06/22/2018. [facility] CT Abdomen and Pelvis 03/09/2017.   FINDINGS: Segmentation:  Normal on the comparisons.   Alignment: Stable since 2018. Grade 1 anterolisthesis of L4 on L5 measuring 5-6 millimeters. Subtle retrolisthesis of L1 on L2. Preserved lumbar lordosis overall.   Vertebrae: Mild degenerative appearing endplate marrow edema at the L1-L2 endplates, to a lesser extent T12-L1 endplates. Minimal similar L5 superior endplate edema, and also possible minimal edema in the left L4 and L5 facets.   Background bone marrow signal is normal. No suspicious osseous lesion. Intact visible sacrum and SI joints.   Conus medullaris and cauda equina: Conus extends to the T12-L1 level. No lower spinal cord or conus signal abnormality.   Paraspinal and other soft tissues: Within normal limits; the left ureter in now appears decompressed compared to the 2018 CT. Negative visualized posterior paraspinal soft tissues.   Disc levels:   T10-T11: Negative.   T11-T12: Mild to moderate facet hypertrophy greater on the left. Mild to moderate  left T11 foraminal stenosis.   T12-L1: Minor circumferential disc bulge. Mild facet and ligament flavum hypertrophy. Borderline to mild bilateral T12 foraminal stenosis.   L1-L2: Mild circumferential disc bulge. Mild facet hypertrophy. Borderline to mild left L1  foraminal stenosis.   L2-L3: Mild circumferential disc bulge. Mild facet hypertrophy. Borderline to mild spinal stenosis (series 5, image 24).   L3-L4: Mild circumferential disc bulge. Mild to moderate facet and ligament flavum hypertrophy. Mild to moderate spinal stenosis (series 5, image 29) with left greater than right lateral recess stenosis (L4 nerve levels). Mild right L3 foraminal stenosis.   L4-L5: Grade 1 anterolisthesis with disc space loss and mild circumferential disc bulge/pseudo disc. Severe bilateral facet hypertrophy. Mild to moderate ligament flavum hypertrophy. Borderline to mild spinal and bilateral lateral recess stenosis (L5 nerve levels). Borderline to mild left L4 foraminal stenosis.   L5-S1: Negative disc. Moderate to severe facet hypertrophy greater on the left. No stenosis.   IMPRESSION: 1. Up to moderate multifactorial spinal stenosis at L3-L4, and up to mild stenosis at L2-L3 and L4-L5. Disc bulging at these levels with posterior element hypertrophy which is severe at L4-L5 - due to chronic grade 1 anterolisthesis. 2. Up to severe facet degeneration also at L5-S1, but no associated stenosis at that level. 3. Similar lower thoracic disc bulging and intermittent facet hypertrophy. Up to moderate left T11 foraminal stenosis. 4. Minimal to mild degenerative appearing marrow edema at several levels, including the L1-L2 endplates, left L4-L5 facets.     Electronically Signed   By: Odessa Fleming M.D.   On: 07/06/2018 08:15     Objective:  VS:  HT:    WT:   BMI:     BP:   HR: bpm  TEMP: ( )  RESP:  Physical Exam   Imaging: No results found.

## 2023-08-14 NOTE — Progress Notes (Signed)
Functional Pain Scale - descriptive words and definitions  Distressing (6)    Pain is present/unable to complete most ADLs limited by pain/sleep is difficult and active distraction is only marginal. Moderate range order  Average Pain 6  She can't go without aleve or else she has awful pain  +Driver, -BT, -Dye Allergies.

## 2023-09-24 ENCOUNTER — Telehealth: Payer: Self-pay | Admitting: Orthopaedic Surgery

## 2023-09-24 NOTE — Telephone Encounter (Signed)
Patient called. Would like information about her surgery. Her cb# [phone]

## 2023-10-06 NOTE — Telephone Encounter (Signed)
Copied from CRM [id number]. Topic: Clinical - Medical Advice >> Oct 06, 2023 10:01 AM Tiffany H wrote: Reason for CRM: Patient called to advise that her plans for hip surgery recovery have changed. One sister passed and the other can no longer sit with her. Patient advised that she would like to go to rehab if possible. Patient is scheduled for surgery 10/28/23. Please assist.   Please advise

## 2023-10-07 NOTE — Telephone Encounter (Signed)
We can refer to physical therapy if needed though recommend she discuss this with her orthopedist.  Tina Aguilar. [name], MD 10/07/2023 11:45 AM

## 2023-10-07 NOTE — Telephone Encounter (Signed)
Left message to return call to our office at their convenience.  

## 2023-10-09 ENCOUNTER — Telehealth: Payer: Self-pay | Admitting: Family Medicine

## 2023-10-09 NOTE — Telephone Encounter (Signed)
Copied from CRM [id number]. Topic: General - Call Back - No Documentation >> Oct 09, 2023  9:01 AM Samuel Jester B wrote: Reason for CRM: PT is requesting a call back after she received a call from the nurse shirley regarding her referral for rehab.

## 2023-10-10 ENCOUNTER — Other Ambulatory Visit: Payer: Self-pay | Admitting: Family Medicine

## 2023-10-10 DIAGNOSIS — E7800 Pure hypercholesterolemia, unspecified: Secondary | ICD-10-CM

## 2023-10-10 NOTE — Telephone Encounter (Signed)
Left message to return call to our office at their convenience.  

## 2023-10-10 NOTE — Telephone Encounter (Signed)
Spoke with patient informed PCP responed We can refer to physical therapy if needed though recommend she discuss this with her orthopedist.  Stated will talk with orho first

## 2023-10-10 NOTE — Telephone Encounter (Signed)
See previews message  °

## 2023-10-17 NOTE — Pre-Procedure Instructions (Signed)
 Surgical Instructions   Your procedure is scheduled on October 27, 2022. Report to [facility] Main Entrance A at 5:30 A.M., then check in with the Admitting office. Any questions or running late day of surgery: call [phone]  Questions prior to your surgery date: call [phone], Monday-Friday, 8am-4pm. If you experience any cold or flu symptoms such as cough, fever, chills, shortness of breath, etc. between now and your scheduled surgery, please notify us  at the above number.     Remember:  Do not eat after midnight the night before your surgery  You may drink clear liquids until 4:30 AM the morning of your surgery.   Clear liquids allowed are: Water, Non-Citrus Juices (without pulp), Carbonated Beverages, Clear Tea (no milk, honey, etc.), Black Coffee Only (NO MILK, CREAM OR POWDERED CREAMER of any kind), and Gatorade.    Take these medicines the morning of surgery with A SIP OF WATER: NONE   One week prior to surgery, STOP taking any Aspirin  (unless otherwise instructed by your surgeon) Aleve, Naproxen, Ibuprofen, Motrin, Advil, Goody's, BC's, all herbal medications, fish oil, and non-prescription vitamins.                     Do NOT Smoke (Tobacco/Vaping) for 24 hours prior to your procedure.  If you use a CPAP at night, you may bring your mask/headgear for your overnight stay.   You will be asked to remove any contacts, glasses, piercing's, hearing aid's, dentures/partials prior to surgery. Please bring cases for these items if needed.    Patients discharged the day of surgery will not be allowed to drive home, and someone needs to stay with them for 24 hours.  SURGICAL WAITING ROOM VISITATION Patients may have no more than 2 support people in the waiting area - these visitors may rotate.   Pre-op nurse will coordinate an appropriate time for 1 ADULT support person, who may not rotate, to accompany patient in pre-op.  Children under the age of 66 must have an adult  with them who is not the patient and must remain in the main waiting area with an adult.  If the patient needs to stay at the hospital during part of their recovery, the visitor guidelines for inpatient rooms apply.  Please refer to the [facility] website for the visitor guidelines for any additional information.   If you received a COVID test during your pre-op visit  it is requested that you wear a mask when out in public, stay away from anyone that may not be feeling well and notify your surgeon if you develop symptoms. If you have been in contact with anyone that has tested positive in the last 10 days please notify you surgeon.      Pre-operative 5 CHG Bathing Instructions   You can play a key role in reducing the risk of infection after surgery. Your skin needs to be as free of germs as possible. You can reduce the number of germs on your skin by washing with CHG (chlorhexidine  gluconate) soap before surgery. CHG is an antiseptic soap that kills germs and continues to kill germs even after washing.   DO NOT use if you have an allergy to chlorhexidine /CHG or antibacterial soaps. If your skin becomes reddened or irritated, stop using the CHG and notify one of our RNs at [phone].   Please shower with the CHG soap starting 4 days before surgery using the following schedule:     Please keep in mind  the following:  DO NOT shave, including legs and underarms, starting the day of your first shower.   You may shave your face at any point before/day of surgery.  Place clean sheets on your bed the day you start using CHG soap. Use a clean washcloth (not used since being washed) for each shower. DO NOT sleep with pets once you start using the CHG.   CHG Shower Instructions:  Wash your face and private area with normal soap. If you choose to wash your hair, wash first with your normal shampoo.  After you use shampoo/soap, rinse your hair and body thoroughly to remove shampoo/soap  residue.  Turn the water OFF and apply about 3 tablespoons (45 ml) of CHG soap to a CLEAN washcloth.  Apply CHG soap ONLY FROM YOUR NECK DOWN TO YOUR TOES (washing for 3-5 minutes)  DO NOT use CHG soap on face, private areas, open wounds, or sores.  Pay special attention to the area where your surgery is being performed.  If you are having back surgery, having someone wash your back for you may be helpful. Wait 2 minutes after CHG soap is applied, then you may rinse off the CHG soap.  Pat dry with a clean towel  Put on clean clothes/pajamas   If you choose to wear lotion, please use ONLY the CHG-compatible lotions on the back of this paper.   Additional instructions for the day of surgery: DO NOT APPLY any lotions, deodorants, cologne, or perfumes.   Do not bring valuables to the hospital. [facility] is not responsible for any belongings/valuables. Do not wear nail polish, gel polish, artificial nails, or any other type of covering on natural nails (fingers and toes) Do not wear jewelry or makeup Put on clean/comfortable clothes.  Please brush your teeth.  Ask your nurse before applying any prescription medications to the skin.     CHG Compatible Lotions   Aveeno Moisturizing lotion  Cetaphil Moisturizing Cream  Cetaphil Moisturizing Lotion  Clairol Herbal Essence Moisturizing Lotion, Dry Skin  Clairol Herbal Essence Moisturizing Lotion, Extra Dry Skin  Clairol Herbal Essence Moisturizing Lotion, Normal Skin  Curel Age Defying Therapeutic Moisturizing Lotion with Alpha Hydroxy  Curel Extreme Care Body Lotion  Curel Soothing Hands Moisturizing Hand Lotion  Curel Therapeutic Moisturizing Cream, Fragrance-Free  Curel Therapeutic Moisturizing Lotion, Fragrance-Free  Curel Therapeutic Moisturizing Lotion, Original Formula  Eucerin Daily Replenishing Lotion  Eucerin Dry Skin Therapy Plus Alpha Hydroxy Crme  Eucerin Dry Skin Therapy Plus Alpha Hydroxy Lotion  Eucerin Original  Crme  Eucerin Original Lotion  Eucerin Plus Crme Eucerin Plus Lotion  Eucerin TriLipid Replenishing Lotion  Keri Anti-Bacterial Hand Lotion  Keri Deep Conditioning Original Lotion Dry Skin Formula Softly Scented  Keri Deep Conditioning Original Lotion, Fragrance Free Sensitive Skin Formula  Keri Lotion Fast Absorbing Fragrance Free Sensitive Skin Formula  Keri Lotion Fast Absorbing Softly Scented Dry Skin Formula  Keri Original Lotion  Keri Skin Renewal Lotion Keri Silky Smooth Lotion  Keri Silky Smooth Sensitive Skin Lotion  Nivea Body Creamy Conditioning Oil  Nivea Body Extra Enriched Lotion  Nivea Body Original Lotion  Nivea Body Sheer Moisturizing Lotion Nivea Crme  Nivea Skin Firming Lotion  NutraDerm 30 Skin Lotion  NutraDerm Skin Lotion  NutraDerm Therapeutic Skin Cream  NutraDerm Therapeutic Skin Lotion  ProShield Protective Hand Cream  Provon moisturizing lotion  For additional information about your upcoming joint replacement surgery, please use the QR code or web address listed below.  [email or web address]

## 2023-10-20 ENCOUNTER — Other Ambulatory Visit: Payer: Self-pay

## 2023-10-20 ENCOUNTER — Encounter (HOSPITAL_COMMUNITY): Payer: Self-pay

## 2023-10-20 ENCOUNTER — Encounter (HOSPITAL_COMMUNITY)
Admission: RE | Admit: 2023-10-20 | Discharge: 2023-10-20 | Disposition: A | Discharge status: Home / Self Care | Payer: Medicare Other | Source: Ambulatory Visit | Attending: Orthopaedic Surgery | Admitting: Orthopaedic Surgery

## 2023-10-20 VITALS — BP 138/73 | HR 76 | Temp 97.6°F | Resp 18 | Ht 63.0 in | Wt 172.8 lb

## 2023-10-20 DIAGNOSIS — M1611 Unilateral primary osteoarthritis, right hip: Secondary | ICD-10-CM | POA: Diagnosis not present

## 2023-10-20 DIAGNOSIS — Z01812 Encounter for preprocedural laboratory examination: Secondary | ICD-10-CM | POA: Diagnosis not present

## 2023-10-20 DIAGNOSIS — Z01818 Encounter for other preprocedural examination: Secondary | ICD-10-CM

## 2023-10-20 HISTORY — DX: Depression, unspecified: F32.A

## 2023-10-20 LAB — TYPE AND SCREEN
ABO/RH(D): A POS
Antibody Screen: NEGATIVE

## 2023-10-20 LAB — CBC
HCT: 41.5 % (ref 36.0–46.0)
Hemoglobin: 13.6 g/dL (ref 12.0–15.0)
MCH: 30 pg (ref 26.0–34.0)
MCHC: 32.8 g/dL (ref 30.0–36.0)
MCV: 91.6 fL (ref 80.0–100.0)
Platelets: 282 10*3/uL (ref 150–400)
RBC: 4.53 MIL/uL (ref 3.87–5.11)
RDW: 13.3 % (ref 11.5–15.5)
WBC: 8.1 10*3/uL (ref 4.0–10.5)
nRBC: 0 % (ref 0.0–0.2)

## 2023-10-20 LAB — BASIC METABOLIC PANEL
Anion gap: 7 (ref 5–15)
BUN: 17 mg/dL (ref 8–23)
CO2: 25 mmol/L (ref 22–32)
Calcium: 9.2 mg/dL (ref 8.9–10.3)
Chloride: 109 mmol/L (ref 98–111)
Creatinine, Ser: 0.63 mg/dL (ref 0.44–1.00)
GFR, Estimated: >60 mL/min (ref >60)
Glucose, Bld: 93 mg/dL (ref 70–99)
Potassium: 4.4 mmol/L (ref 3.5–5.1)
Sodium: 141 mmol/L (ref 135–145)

## 2023-10-20 LAB — SURGICAL PCR SCREEN
MRSA, PCR: NEGATIVE
Staphylococcus aureus: NEGATIVE

## 2023-10-20 NOTE — Progress Notes (Signed)
 PCP - Dr. Worth Kitty Cardiologist - denies  PPM/ICD - denies   Chest x-ray - denies EKG - 03/12/20 (n/a) Stress Test - denies ECHO - denies Cardiac Cath - denies  Sleep Study - denies   DM- denies  Last dose of GLP1 agonist-  n/a   ASA/Blood Thinner Instructions: n/a   ERAS Protcol - yes PRE-SURGERY Ensure given at PAT  COVID TEST- n/a   Anesthesia review: no  Patient denies shortness of breath, fever, cough and chest pain at PAT appointment   All instructions explained to the patient, with a verbal understanding of the material. Patient agrees to go over the instructions while at home for a better understanding.  The opportunity to ask questions was provided.

## 2023-10-20 NOTE — Pre-Procedure Instructions (Signed)
 Surgical Instructions   Your procedure is scheduled on October 27, 2022. Report to [facility] Main Entrance A at 5:30 A.M., then check in with the Admitting office. Any questions or running late day of surgery: call [phone]  Questions prior to your surgery date: call [phone], Monday-Friday, 8am-4pm. If you experience any cold or flu symptoms such as cough, fever, chills, shortness of breath, etc. between now and your scheduled surgery, please notify us  at the above number.     Remember:  Do not eat after midnight the night before your surgery  You may drink clear liquids until 4:30 AM the morning of your surgery.   Clear liquids allowed are: Water, Non-Citrus Juices (without pulp), Carbonated Beverages, Clear Tea (no milk, honey, etc.), Black Coffee Only (NO MILK, CREAM OR POWDERED CREAMER of any kind), and Gatorade.  Patient Instructions  The night before surgery:  No food after midnight. ONLY clear liquids after midnight  The day of surgery (if you do NOT have diabetes):  Drink ONE (1) Pre-Surgery Clear Ensure by 04:30 AM the morning of surgery. Drink in one sitting. Do not sip.  This drink was given to you during your hospital  pre-op appointment visit.  Nothing else to drink after completing the  Pre-Surgery Clear Ensure.          If you have questions, please contact your surgeon's office.    Take these medicines the morning of surgery with A SIP OF WATER: NONE   One week prior to surgery, STOP taking any Aspirin  (unless otherwise instructed by your surgeon) Aleve, Naproxen, Ibuprofen, Motrin, Advil, Goody's, BC's, all herbal medications, fish oil, and non-prescription vitamins.                     Do NOT Smoke (Tobacco/Vaping) for 24 hours prior to your procedure.  If you use a CPAP at night, you may bring your mask/headgear for your overnight stay.   You will be asked to remove any contacts, glasses, piercing's, hearing aid's, dentures/partials prior to  surgery. Please bring cases for these items if needed.    Patients discharged the day of surgery will not be allowed to drive home, and someone needs to stay with them for 24 hours.  SURGICAL WAITING ROOM VISITATION Patients may have no more than 2 support people in the waiting area - these visitors may rotate.   Pre-op nurse will coordinate an appropriate time for 1 ADULT support person, who may not rotate, to accompany patient in pre-op.  Children under the age of 25 must have an adult with them who is not the patient and must remain in the main waiting area with an adult.  If the patient needs to stay at the hospital during part of their recovery, the visitor guidelines for inpatient rooms apply.  Please refer to the [facility] website for the visitor guidelines for any additional information.   If you received a COVID test during your pre-op visit  it is requested that you wear a mask when out in public, stay away from anyone that may not be feeling well and notify your surgeon if you develop symptoms. If you have been in contact with anyone that has tested positive in the last 10 days please notify you surgeon.      Pre-operative 5 CHG Bathing Instructions   You can play a key role in reducing the risk of infection after surgery. Your skin needs to be as free of germs as possible.  You can reduce the number of germs on your skin by washing with CHG (chlorhexidine  gluconate) soap before surgery. CHG is an antiseptic soap that kills germs and continues to kill germs even after washing.   DO NOT use if you have an allergy to chlorhexidine /CHG or antibacterial soaps. If your skin becomes reddened or irritated, stop using the CHG and notify one of our RNs at [phone].   Please shower with the CHG soap starting 4 days before surgery using the following schedule:     Please keep in mind the following:  DO NOT shave, including legs and underarms, starting the day of your first shower.    You may shave your face at any point before/day of surgery.  Place clean sheets on your bed the day you start using CHG soap. Use a clean washcloth (not used since being washed) for each shower. DO NOT sleep with pets once you start using the CHG.   CHG Shower Instructions:  Wash your face and private area with normal soap. If you choose to wash your hair, wash first with your normal shampoo.  After you use shampoo/soap, rinse your hair and body thoroughly to remove shampoo/soap residue.  Turn the water OFF and apply about 3 tablespoons (45 ml) of CHG soap to a CLEAN washcloth.  Apply CHG soap ONLY FROM YOUR NECK DOWN TO YOUR TOES (washing for 3-5 minutes)  DO NOT use CHG soap on face, private areas, open wounds, or sores.  Pay special attention to the area where your surgery is being performed.  If you are having back surgery, having someone wash your back for you may be helpful. Wait 2 minutes after CHG soap is applied, then you may rinse off the CHG soap.  Pat dry with a clean towel  Put on clean clothes/pajamas   If you choose to wear lotion, please use ONLY the CHG-compatible lotions on the back of this paper.   Additional instructions for the day of surgery: DO NOT APPLY any lotions, deodorants, cologne, or perfumes.   Do not bring valuables to the hospital. [facility] is not responsible for any belongings/valuables. Do not wear nail polish, gel polish, artificial nails, or any other type of covering on natural nails (fingers and toes) Do not wear jewelry or makeup Put on clean/comfortable clothes.  Please brush your teeth.  Ask your nurse before applying any prescription medications to the skin.     CHG Compatible Lotions   Aveeno Moisturizing lotion  Cetaphil Moisturizing Cream  Cetaphil Moisturizing Lotion  Clairol Herbal Essence Moisturizing Lotion, Dry Skin  Clairol Herbal Essence Moisturizing Lotion, Extra Dry Skin  Clairol Herbal Essence Moisturizing Lotion,  Normal Skin  Curel Age Defying Therapeutic Moisturizing Lotion with Alpha Hydroxy  Curel Extreme Care Body Lotion  Curel Soothing Hands Moisturizing Hand Lotion  Curel Therapeutic Moisturizing Cream, Fragrance-Free  Curel Therapeutic Moisturizing Lotion, Fragrance-Free  Curel Therapeutic Moisturizing Lotion, Original Formula  Eucerin Daily Replenishing Lotion  Eucerin Dry Skin Therapy Plus Alpha Hydroxy Crme  Eucerin Dry Skin Therapy Plus Alpha Hydroxy Lotion  Eucerin Original Crme  Eucerin Original Lotion  Eucerin Plus Crme Eucerin Plus Lotion  Eucerin TriLipid Replenishing Lotion  Keri Anti-Bacterial Hand Lotion  Keri Deep Conditioning Original Lotion Dry Skin Formula Softly Scented  Keri Deep Conditioning Original Lotion, Fragrance Free Sensitive Skin Formula  Keri Lotion Fast Absorbing Fragrance Free Sensitive Skin Formula  Keri Lotion Fast Absorbing Softly Scented Dry Skin Formula  Keri Original Lotion  Keri Skin  Renewal Lotion Wellpoint Smooth Lotion  Keri Silky Smooth Sensitive Skin Lotion  Nivea Body Creamy Conditioning Oil  Nivea Body Extra Enriched Lotion  Nivea Body Original Lotion  Nivea Body Sheer Moisturizing Lotion Nivea Crme  Nivea Skin Firming Lotion  NutraDerm 30 Skin Lotion  NutraDerm Skin Lotion  NutraDerm Therapeutic Skin Cream  NutraDerm Therapeutic Skin Lotion  ProShield Protective Hand Cream  Provon moisturizing lotion  For additional information about your upcoming joint replacement surgery, please use the QR code or web address listed below.    [email or web address]

## 2023-10-27 NOTE — Anesthesia Preprocedure Evaluation (Addendum)
 Anesthesia Evaluation  Patient identified by MRN, date of birth, ID band Patient awake    Reviewed: Allergy & Precautions, NPO status , Patient's Chart, lab work & pertinent test results  Airway Mallampati: II  TM Distance: >3 FB Neck ROM: Full    Dental  (+) Teeth Intact, Dental Advisory Given, Chipped,    Pulmonary former smoker   Pulmonary exam normal breath sounds clear to auscultation       Cardiovascular Exercise Tolerance: Good negative cardio ROS Normal cardiovascular exam Rhythm:Regular Rate:Normal     Neuro/Psych  PSYCHIATRIC DISORDERS  Depression    negative neurological ROS     GI/Hepatic negative GI ROS, Neg liver ROS,,,  Endo/Other  Obesity   Renal/GU negative Renal ROS     Musculoskeletal  (+) Arthritis ,    Abdominal   Peds  Hematology negative hematology ROS (+) Plt 282k   Anesthesia Other Findings   Reproductive/Obstetrics                             Anesthesia Physical Anesthesia Plan  ASA: 2  Anesthesia Plan: Spinal   Post-op Pain Management: Tylenol  PO (pre-op)* and Toradol  IV (intra-op)*   Induction: Intravenous  PONV Risk Score and Plan: 2 and Midazolam , TIVA, Dexamethasone  and Ondansetron   Airway Management Planned: Natural Airway and Simple Face Mask  Additional Equipment:   Intra-op Plan:   Post-operative Plan:   Informed Consent: I have reviewed the patients History and Physical, chart, labs and discussed the procedure including the risks, benefits and alternatives for the proposed anesthesia with the patient or authorized representative who has indicated his/her understanding and acceptance.     Dental advisory given  Plan Discussed with: CRNA  Anesthesia Plan Comments:        Anesthesia Quick Evaluation

## 2023-10-27 NOTE — H&P (Signed)
 TOTAL HIP ADMISSION H&P  Patient is admitted for right total hip arthroplasty.  Subjective:  Chief Complaint: right hip pain  HPI: Tina Aguilar, 68 y.o. female, has a history of pain and functional disability in the right hip(s) due to arthritis and patient has failed non-surgical conservative treatments for greater than 12 weeks to include NSAID's and/or analgesics, corticosteriod injections, use of assistive devices, weight reduction as appropriate, and activity modification.  Onset of symptoms was gradual starting 5 years ago with gradually worsening course since that time.The patient noted no past surgery on the right hip(s).  Patient currently rates pain in the right hip at 10 out of 10 with activity. Patient has night pain, worsening of pain with activity and weight bearing, pain that interfers with activities of daily living, and pain with passive range of motion. Patient has evidence of subchondral sclerosis, periarticular osteophytes, and joint space narrowing by imaging studies. This condition presents safety issues increasing the risk of falls.  There is no current active infection.  Patient Active Problem List   Diagnosis Date Noted   Unilateral primary osteoarthritis, left hip 07/09/2023   Unilateral primary osteoarthritis, right hip 07/09/2023   Stress 07/31/2022   Osteoarthritis 03/17/2020   Vertigo 03/17/2020   Binge eating disorder 05/27/2017   Headache 05/27/2017   Colon polyps 08/30/2010   Hyperlipidemia 06/30/2007   Past Medical History:  Diagnosis Date   Arthritis    hips, hands   Chronic kidney disease    hx of kidney stones   Depression    Frequency of urination    History of adenomatous polyp of colon 08/28/2005   Hyperlipidemia, mixed    on meds   Left ureteral stone    Osteoarthritis    bilateral hips/lower back   Spinal stenosis    Vertigo    Wears glasses     Past Surgical History:  Procedure Laterality Date   CESAREAN SECTION  1991    and 1993   COLONOSCOPY  2012   JP-F/V-movi(exc)-normal   CYSTOSCOPY/URETEROSCOPY/HOLMIUM LASER/STENT PLACEMENT Left 05/06/2017   Procedure: CYSTOSCOPY/URETEROSCOPY/HOLMIUM LASER/STENT PLACEMENT;  Surgeon: Chauncey Redell Agent, MD;  Location: [facility];  Service: Urology;  Laterality: Left;   DILATION AND CURETTAGE OF UTERUS  1980   w/ Suction for miscarriage  x2   WISDOM TOOTH EXTRACTION  1976    No current facility-administered medications for this encounter.   Current Outpatient Medications  Medication Sig Dispense Refill Last Dose/Taking   ezetimibe -simvastatin  (VYTORIN ) 10-10 MG tablet TAKE 1 TABLET BY MOUTH AT BEDTIME 90 tablet 3 Taking   Multiple Vitamin (MULTIVITAMIN WITH MINERALS) TABS tablet Take 2 tablets by mouth daily.   Taking   naproxen sodium (ALEVE) 220 MG tablet Take 220 mg by mouth at bedtime as needed (pain).   Taking As Needed   No Known Allergies  Social History   Tobacco Use   Smoking status: Former    Current packs/day: 0.00    Average packs/day: 1 pack/day for 5.0 years (5.0 ttl pk-yrs)    Types: Cigarettes    Start date: 04/30/1972    Quit date: 04/30/1977    Years since quitting: 46.5   Smokeless tobacco: Never  Substance Use Topics   Alcohol use: No    Alcohol/week: 0.0 standard drinks of alcohol    Family History  Problem Relation Age of Onset   Coronary artery disease Mother    Arthritis Mother    Congestive Heart Failure Father    Heart attack Father  Hyperlipidemia Sister    Diabetes Sister    Heart disease Sister    Hyperlipidemia Sister    Hyperlipidemia Sister    Other Brother        coronary artery bypass graft age 30   Hyperlipidemia Brother    Diabetes Brother    Heart attack Maternal Aunt        at age 77   Breast cancer Neg Hx    Colon polyps Neg Hx    Colon cancer Neg Hx    Esophageal cancer Neg Hx    Rectal cancer Neg Hx    Stomach cancer Neg Hx      Review of Systems  Objective:  Physical  Exam Vitals reviewed.  Constitutional:      Appearance: Normal appearance.  HENT:     Head: Normocephalic and atraumatic.  Eyes:     Extraocular Movements: Extraocular movements intact.     Pupils: Pupils are equal, round, and reactive to light.  Cardiovascular:     Rate and Rhythm: Normal rate and regular rhythm.     Pulses: Normal pulses.  Pulmonary:     Effort: Pulmonary effort is normal.     Breath sounds: Normal breath sounds.  Abdominal:     Palpations: Abdomen is soft.  Musculoskeletal:     Cervical back: Normal range of motion and neck supple.     Right hip: Tenderness and bony tenderness present. Decreased range of motion. Decreased strength.  Neurological:     Mental Status: She is alert and oriented to person, place, and time.  Psychiatric:        Behavior: Behavior normal.     Vital signs in last 24 hours:    Labs:   Estimated body mass index is 30.61 kg/m as calculated from the following:   Height as of 10/20/23: 5' 3 (1.6 m).   Weight as of 10/20/23: 78.4 kg.   Imaging Review Plain radiographs demonstrate severe degenerative joint disease of the right hip(s). The bone quality appears to be good for age and reported activity level.      Assessment/Plan:  End stage arthritis, right hip(s)  The patient history, physical examination, clinical judgement of the provider and imaging studies are consistent with end stage degenerative joint disease of the right hip(s) and total hip arthroplasty is deemed medically necessary. The treatment options including medical management, injection therapy, arthroscopy and arthroplasty were discussed at length. The risks and benefits of total hip arthroplasty were presented and reviewed. The risks due to aseptic loosening, infection, stiffness, dislocation/subluxation,  thromboembolic complications and other imponderables were discussed.  The patient acknowledged the explanation, agreed to proceed with the plan and consent was  signed. Patient is being admitted for inpatient treatment for surgery, pain control, PT, OT, prophylactic antibiotics, VTE prophylaxis, progressive ambulation and ADL's and discharge planning.The patient is planning to be discharged home with home health services

## 2023-10-28 ENCOUNTER — Other Ambulatory Visit: Payer: Self-pay

## 2023-10-28 ENCOUNTER — Ambulatory Visit (HOSPITAL_COMMUNITY): Payer: Self-pay | Admitting: Anesthesiology

## 2023-10-28 ENCOUNTER — Ambulatory Visit (HOSPITAL_COMMUNITY): Payer: Medicare PPO

## 2023-10-28 ENCOUNTER — Encounter (HOSPITAL_COMMUNITY): Payer: Self-pay | Admitting: Orthopaedic Surgery

## 2023-10-28 ENCOUNTER — Encounter (HOSPITAL_COMMUNITY)
Admission: RE | Disposition: A | Discharge status: Home / Self Care | Payer: Self-pay | Source: Home / Self Care | Attending: Orthopaedic Surgery

## 2023-10-28 ENCOUNTER — Observation Stay (HOSPITAL_COMMUNITY): Payer: Medicare PPO

## 2023-10-28 ENCOUNTER — Observation Stay (HOSPITAL_COMMUNITY)
Admission: RE | Admit: 2023-10-28 | Discharge: 2023-10-29 | Disposition: A | Discharge status: Home / Self Care | Payer: Medicare PPO | Attending: Orthopaedic Surgery | Admitting: Orthopaedic Surgery

## 2023-10-28 DIAGNOSIS — M1611 Unilateral primary osteoarthritis, right hip: Secondary | ICD-10-CM

## 2023-10-28 DIAGNOSIS — E782 Mixed hyperlipidemia: Secondary | ICD-10-CM | POA: Insufficient documentation

## 2023-10-28 DIAGNOSIS — M1612 Unilateral primary osteoarthritis, left hip: Secondary | ICD-10-CM | POA: Diagnosis not present

## 2023-10-28 DIAGNOSIS — Z860101 Personal history of adenomatous and serrated colon polyps: Secondary | ICD-10-CM | POA: Insufficient documentation

## 2023-10-28 DIAGNOSIS — N189 Chronic kidney disease, unspecified: Secondary | ICD-10-CM | POA: Insufficient documentation

## 2023-10-28 DIAGNOSIS — F32A Depression, unspecified: Secondary | ICD-10-CM | POA: Diagnosis not present

## 2023-10-28 DIAGNOSIS — Z471 Aftercare following joint replacement surgery: Secondary | ICD-10-CM | POA: Diagnosis not present

## 2023-10-28 DIAGNOSIS — Z79899 Other long term (current) drug therapy: Secondary | ICD-10-CM | POA: Insufficient documentation

## 2023-10-28 DIAGNOSIS — Z96641 Presence of right artificial hip joint: Secondary | ICD-10-CM

## 2023-10-28 DIAGNOSIS — Z87891 Personal history of nicotine dependence: Secondary | ICD-10-CM | POA: Insufficient documentation

## 2023-10-28 HISTORY — PX: TOTAL HIP ARTHROPLASTY: SHX124

## 2023-10-28 LAB — ABO/RH: ABO/RH(D): A POS

## 2023-10-28 SURGERY — ARTHROPLASTY, HIP, TOTAL, ANTERIOR APPROACH
Anesthesia: Spinal | Site: Hip | Laterality: Right

## 2023-10-28 MED ORDER — MENTHOL 3 MG MT LOZG
1.0000 | LOZENGE | OROMUCOSAL | Status: DC | PRN
Start: 2023-10-28 — End: 2023-10-29

## 2023-10-28 MED ORDER — PROPOFOL 500 MG/50ML IV EMUL
INTRAVENOUS | Status: DC | PRN
  Administered 2023-10-28: 50 ug/kg/min via INTRAVENOUS

## 2023-10-28 MED ORDER — POVIDONE-IODINE 10 % EX SWAB
2.0000 | Freq: Once | CUTANEOUS | Status: AC
  Administered 2023-10-28: 2 via TOPICAL

## 2023-10-28 MED ORDER — ONDANSETRON HCL 4 MG PO TABS: 4.0000 mg | ORAL_TABLET | Freq: Four times a day (QID) | ORAL | Status: DC | PRN

## 2023-10-28 MED ORDER — SENNA 8.6 MG PO TABS
1.0000 | ORAL_TABLET | Freq: Two times a day (BID) | ORAL | Status: DC | PRN
  Administered 2023-10-28: 8.6 mg via ORAL
  Filled 2023-10-28: qty 1

## 2023-10-28 MED ORDER — LIDOCAINE 2% (20 MG/ML) 5 ML SYRINGE
INTRAMUSCULAR | Status: AC
  Filled 2023-10-28: qty 5

## 2023-10-28 MED ORDER — PROPOFOL 10 MG/ML IV BOLUS
INTRAVENOUS | Status: AC
  Filled 2023-10-28: qty 20

## 2023-10-28 MED ORDER — METHOCARBAMOL 1000 MG/10ML IJ SOLN: 500.0000 mg | Freq: Four times a day (QID) | INTRAMUSCULAR | Status: DC | PRN

## 2023-10-28 MED ORDER — ACETAMINOPHEN 325 MG PO TABS: 325.0000 mg | ORAL_TABLET | Freq: Four times a day (QID) | ORAL | Status: DC | PRN

## 2023-10-28 MED ORDER — DIPHENHYDRAMINE HCL 12.5 MG/5ML PO ELIX: 12.5000 mg | ORAL_SOLUTION | ORAL | Status: DC | PRN

## 2023-10-28 MED ORDER — ONDANSETRON HCL 4 MG/2ML IJ SOLN
INTRAMUSCULAR | Status: DC | PRN
  Administered 2023-10-28: 4 mg via INTRAVENOUS

## 2023-10-28 MED ORDER — HYDROMORPHONE HCL 1 MG/ML IJ SOLN
0.5000 mg | INTRAMUSCULAR | Status: DC | PRN
Start: 2023-10-28 — End: 2023-10-29
  Administered 2023-10-28: 1 mg via INTRAVENOUS
  Filled 2023-10-28: qty 1

## 2023-10-28 MED ORDER — PHENYLEPHRINE 80 MCG/ML (10ML) SYRINGE FOR IV PUSH (FOR BLOOD PRESSURE SUPPORT)
PREFILLED_SYRINGE | INTRAVENOUS | Status: DC | PRN
  Administered 2023-10-28 (×3): 80 ug via INTRAVENOUS

## 2023-10-28 MED ORDER — LIDOCAINE 2% (20 MG/ML) 5 ML SYRINGE
INTRAMUSCULAR | Status: DC | PRN
  Administered 2023-10-28: 60 mg via INTRAVENOUS

## 2023-10-28 MED ORDER — CHLORHEXIDINE GLUCONATE 0.12 % MT SOLN
15.0000 mL | Freq: Once | OROMUCOSAL | Status: AC
  Administered 2023-10-28: 15 mL via OROMUCOSAL
  Filled 2023-10-28: qty 15

## 2023-10-28 MED ORDER — CEFAZOLIN SODIUM-DEXTROSE 2-4 GM/100ML-% IV SOLN
2.0000 g | INTRAVENOUS | Status: AC
  Administered 2023-10-28: 2 g via INTRAVENOUS
  Filled 2023-10-28: qty 100

## 2023-10-28 MED ORDER — MIDAZOLAM HCL 2 MG/2ML IJ SOLN
INTRAMUSCULAR | Status: DC | PRN
  Administered 2023-10-28: 2 mg via INTRAVENOUS

## 2023-10-28 MED ORDER — SODIUM CHLORIDE 0.9 % IR SOLN
Status: DC | PRN
  Administered 2023-10-28: 1000 mL

## 2023-10-28 MED ORDER — OXYCODONE HCL 5 MG PO TABS
5.0000 mg | ORAL_TABLET | ORAL | Status: DC | PRN
Start: 2023-10-28 — End: 2023-10-29
  Administered 2023-10-28: 10 mg via ORAL
  Administered 2023-10-28: 5 mg via ORAL
  Administered 2023-10-28 – 2023-10-29 (×2): 10 mg via ORAL
  Administered 2023-10-29 (×2): 5 mg via ORAL
  Filled 2023-10-28: qty 2
  Filled 2023-10-28: qty 1
  Filled 2023-10-28: qty 2
  Filled 2023-10-28: qty 1
  Filled 2023-10-28 (×2): qty 2

## 2023-10-28 MED ORDER — PANTOPRAZOLE SODIUM 40 MG PO TBEC
40.0000 mg | DELAYED_RELEASE_TABLET | Freq: Every day | ORAL | Status: DC
  Administered 2023-10-28 – 2023-10-29 (×2): 40 mg via ORAL
  Filled 2023-10-28 (×2): qty 1

## 2023-10-28 MED ORDER — MIDAZOLAM HCL 2 MG/2ML IJ SOLN
INTRAMUSCULAR | Status: AC
  Filled 2023-10-28: qty 2

## 2023-10-28 MED ORDER — DEXAMETHASONE SODIUM PHOSPHATE 10 MG/ML IJ SOLN
INTRAMUSCULAR | Status: AC
  Filled 2023-10-28: qty 1

## 2023-10-28 MED ORDER — DEXAMETHASONE SODIUM PHOSPHATE 10 MG/ML IJ SOLN
INTRAMUSCULAR | Status: DC | PRN
  Administered 2023-10-28: 5 mg via INTRAVENOUS

## 2023-10-28 MED ORDER — ORAL CARE MOUTH RINSE: 15.0000 mL | Freq: Once | OROMUCOSAL | Status: AC

## 2023-10-28 MED ORDER — TRANEXAMIC ACID-NACL 1000-0.7 MG/100ML-% IV SOLN
1000.0000 mg | INTRAVENOUS | Status: AC
  Administered 2023-10-28: 1000 mg via INTRAVENOUS
  Filled 2023-10-28: qty 100

## 2023-10-28 MED ORDER — FENTANYL CITRATE (PF) 250 MCG/5ML IJ SOLN
INTRAMUSCULAR | Status: AC
  Filled 2023-10-28: qty 5

## 2023-10-28 MED ORDER — EZETIMIBE 10 MG PO TABS
10.0000 mg | ORAL_TABLET | Freq: Every day | ORAL | Status: DC
  Administered 2023-10-28: 10 mg via ORAL
  Filled 2023-10-28: qty 1

## 2023-10-28 MED ORDER — ONDANSETRON HCL 4 MG/2ML IJ SOLN: 4.0000 mg | Freq: Once | INTRAMUSCULAR | Status: DC | PRN

## 2023-10-28 MED ORDER — PHENYLEPHRINE 80 MCG/ML (10ML) SYRINGE FOR IV PUSH (FOR BLOOD PRESSURE SUPPORT)
PREFILLED_SYRINGE | INTRAVENOUS | Status: AC
  Filled 2023-10-28: qty 10

## 2023-10-28 MED ORDER — FENTANYL CITRATE (PF) 100 MCG/2ML IJ SOLN: 25.0000 ug | INTRAMUSCULAR | Status: DC | PRN

## 2023-10-28 MED ORDER — 0.9 % SODIUM CHLORIDE (POUR BTL) OPTIME
TOPICAL | Status: DC | PRN
  Administered 2023-10-28: 1000 mL

## 2023-10-28 MED ORDER — LACTATED RINGERS IV SOLN: INTRAVENOUS | Status: DC

## 2023-10-28 MED ORDER — ALUM & MAG HYDROXIDE-SIMETH 200-200-20 MG/5ML PO SUSP: 30.0000 mL | ORAL | Status: DC | PRN

## 2023-10-28 MED ORDER — ACETAMINOPHEN 500 MG PO TABS
1000.0000 mg | ORAL_TABLET | Freq: Once | ORAL | Status: AC
  Administered 2023-10-28: 1000 mg via ORAL
  Filled 2023-10-28: qty 2

## 2023-10-28 MED ORDER — METHOCARBAMOL 500 MG PO TABS
500.0000 mg | ORAL_TABLET | Freq: Four times a day (QID) | ORAL | Status: DC | PRN
  Administered 2023-10-28 – 2023-10-29 (×3): 500 mg via ORAL
  Filled 2023-10-28 (×3): qty 1

## 2023-10-28 MED ORDER — PHENOL 1.4 % MT LIQD: 1.0000 | OROMUCOSAL | Status: DC | PRN

## 2023-10-28 MED ORDER — ONDANSETRON HCL 4 MG/2ML IJ SOLN
INTRAMUSCULAR | Status: AC
  Filled 2023-10-28: qty 2

## 2023-10-28 MED ORDER — METOCLOPRAMIDE HCL 5 MG/ML IJ SOLN: 5.0000 mg | Freq: Three times a day (TID) | INTRAMUSCULAR | Status: DC | PRN

## 2023-10-28 MED ORDER — PROPOFOL 10 MG/ML IV BOLUS
INTRAVENOUS | Status: DC | PRN
  Administered 2023-10-28: 30 mg via INTRAVENOUS

## 2023-10-28 MED ORDER — ASPIRIN 81 MG PO CHEW
81.0000 mg | CHEWABLE_TABLET | Freq: Two times a day (BID) | ORAL | Status: DC
  Administered 2023-10-28 – 2023-10-29 (×2): 81 mg via ORAL
  Filled 2023-10-28 (×2): qty 1

## 2023-10-28 MED ORDER — ONDANSETRON HCL 4 MG/2ML IJ SOLN: 4.0000 mg | Freq: Four times a day (QID) | INTRAMUSCULAR | Status: DC | PRN

## 2023-10-28 MED ORDER — ROCURONIUM BROMIDE 10 MG/ML (PF) SYRINGE
PREFILLED_SYRINGE | INTRAVENOUS | Status: AC
  Filled 2023-10-28: qty 10

## 2023-10-28 MED ORDER — OXYCODONE HCL 5 MG PO TABS
10.0000 mg | ORAL_TABLET | ORAL | Status: DC | PRN
Start: 2023-10-28 — End: 2023-10-29

## 2023-10-28 MED ORDER — METOCLOPRAMIDE HCL 5 MG PO TABS: 5.0000 mg | ORAL_TABLET | Freq: Three times a day (TID) | ORAL | Status: DC | PRN

## 2023-10-28 MED ORDER — CEFAZOLIN SODIUM-DEXTROSE 2-4 GM/100ML-% IV SOLN
2.0000 g | Freq: Four times a day (QID) | INTRAVENOUS | Status: AC
  Administered 2023-10-28 (×2): 2 g via INTRAVENOUS
  Filled 2023-10-28 (×2): qty 100

## 2023-10-28 MED ORDER — EZETIMIBE-SIMVASTATIN 10-10 MG PO TABS: 1.0000 | ORAL_TABLET | Freq: Every day | ORAL | Status: DC

## 2023-10-28 MED ORDER — SODIUM CHLORIDE 0.9 % IV SOLN
INTRAVENOUS | Status: DC
Start: 2023-10-28 — End: 2023-10-29

## 2023-10-28 MED ORDER — FENTANYL CITRATE (PF) 250 MCG/5ML IJ SOLN
INTRAMUSCULAR | Status: DC | PRN
  Administered 2023-10-28: 50 ug via INTRAVENOUS
  Administered 2023-10-28 (×2): 25 ug via INTRAVENOUS

## 2023-10-28 MED ORDER — SIMVASTATIN 20 MG PO TABS
10.0000 mg | ORAL_TABLET | Freq: Every day | ORAL | Status: DC
Start: 2023-10-28 — End: 2023-10-29
  Administered 2023-10-28: 10 mg via ORAL
  Filled 2023-10-28: qty 1

## 2023-10-28 MED ORDER — DOCUSATE SODIUM 100 MG PO CAPS
100.0000 mg | ORAL_CAPSULE | Freq: Two times a day (BID) | ORAL | Status: DC
  Administered 2023-10-28 – 2023-10-29 (×2): 100 mg via ORAL
  Filled 2023-10-28 (×2): qty 1

## 2023-10-28 SURGICAL SUPPLY — 46 items
BAG COUNTER SPONGE SURGICOUNT (BAG) ×1 IMPLANT
BENZOIN TINCTURE PRP APPL 2/3 (GAUZE/BANDAGES/DRESSINGS) ×1 IMPLANT
BLADE CLIPPER SURG (BLADE) IMPLANT
BLADE SAW SGTL 18X1.27X75 (BLADE) ×1 IMPLANT
COOLER ICEMAN CLASSIC (MISCELLANEOUS) IMPLANT
COVER SURGICAL LIGHT HANDLE (MISCELLANEOUS) ×1 IMPLANT
DRAPE C-ARM 42X72 X-RAY (DRAPES) ×1 IMPLANT
DRAPE STERI IOBAN 125X83 (DRAPES) ×1 IMPLANT
DRAPE U-SHAPE 47X51 STRL (DRAPES) ×3 IMPLANT
DRSG AQUACEL AG ADV 3.5X10 (GAUZE/BANDAGES/DRESSINGS) ×1 IMPLANT
DURAPREP 26ML APPLICATOR (WOUND CARE) ×1 IMPLANT
ELECT BLADE 4.0 EZ CLEAN MEGAD (MISCELLANEOUS) ×1
ELECT BLADE 6.5 EXT (BLADE) IMPLANT
ELECT REM PT RETURN 9FT ADLT (ELECTROSURGICAL) ×1
ELECTRODE BLDE 4.0 EZ CLN MEGD (MISCELLANEOUS) ×1 IMPLANT
ELECTRODE REM PT RTRN 9FT ADLT (ELECTROSURGICAL) ×1 IMPLANT
FACESHIELD WRAPAROUND (MASK) ×2
FACESHIELD WRAPAROUND OR TEAM (MASK) ×2 IMPLANT
GLOVE BIOGEL PI IND STRL 8 (GLOVE) ×2 IMPLANT
GLOVE ECLIPSE 8.0 STRL XLNG CF (GLOVE) ×1 IMPLANT
GLOVE ORTHO TXT STRL SZ7.5 (GLOVE) ×2 IMPLANT
GOWN STRL REUS W/ TWL LRG LVL3 (GOWN DISPOSABLE) ×2 IMPLANT
GOWN STRL REUS W/ TWL XL LVL3 (GOWN DISPOSABLE) ×2 IMPLANT
HEAD CERAMIC DELTA 36 PLUS 1.5 (Hips) IMPLANT
KIT BASIN OR (CUSTOM PROCEDURE TRAY) ×1 IMPLANT
KIT TURNOVER KIT B (KITS) ×1 IMPLANT
LINER NEUTRAL 52X36MM PLUS 4 (Liner) IMPLANT
MANIFOLD NEPTUNE II (INSTRUMENTS) ×1 IMPLANT
NS IRRIG 1000ML POUR BTL (IV SOLUTION) ×1 IMPLANT
PACK TOTAL JOINT (CUSTOM PROCEDURE TRAY) ×1 IMPLANT
PAD ARMBOARD 7.5X6 YLW CONV (MISCELLANEOUS) ×1 IMPLANT
PAD ORTHO SHOULDER 7X19 LRG (SOFTGOODS) IMPLANT
PIN SECTOR W/GRIP ACE CUP 52MM (Hips) IMPLANT
SET HNDPC FAN SPRY TIP SCT (DISPOSABLE) ×1 IMPLANT
STAPLER VISISTAT 35W (STAPLE) IMPLANT
STEM FEM ACTIS STD SZ4 (Stem) IMPLANT
STRIP CLOSURE SKIN 1/2X4 (GAUZE/BANDAGES/DRESSINGS) ×2 IMPLANT
SUT ETHIBOND NAB CT1 #1 30IN (SUTURE) ×1 IMPLANT
SUT MNCRL AB 4-0 PS2 18 (SUTURE) IMPLANT
SUT VIC AB 0 CT1 27XBRD ANBCTR (SUTURE) ×1 IMPLANT
SUT VIC AB 1 CT1 27XBRD ANBCTR (SUTURE) ×1 IMPLANT
SUT VIC AB 2-0 CT1 TAPERPNT 27 (SUTURE) ×1 IMPLANT
TOWEL GREEN STERILE (TOWEL DISPOSABLE) ×1 IMPLANT
TOWEL GREEN STERILE FF (TOWEL DISPOSABLE) ×1 IMPLANT
TRAY FOLEY W/BAG SLVR 16FR ST (SET/KITS/TRAYS/PACK) IMPLANT
WATER STERILE IRR 1000ML POUR (IV SOLUTION) ×2 IMPLANT

## 2023-10-28 NOTE — Plan of Care (Signed)

## 2023-10-28 NOTE — Anesthesia Postprocedure Evaluation (Signed)
 Anesthesia Post Note  Patient: Nayah Lukens  Procedure(s) Performed: RIGHT TOTAL HIP ARTHROPLASTY ANTERIOR APPROACH (Right: Hip)     Patient location during evaluation: Nursing Unit Anesthesia Type: Spinal Level of consciousness: awake, awake and alert and oriented Pain management: pain level controlled Vital Signs Assessment: post-procedure vital signs reviewed and stable Respiratory status: spontaneous breathing, nonlabored ventilation and respiratory function stable Cardiovascular status: blood pressure returned to baseline and stable Postop Assessment: no headache, no backache, spinal receding and no apparent nausea or vomiting Anesthetic complications: no   No notable events documented.  Last Vitals:  Vitals:   10/28/23 1149 10/28/23 1600  BP: (!) 141/60 134/84  Pulse: 72 85  Resp: 16 20  Temp: 36.7 C 37.1 C  SpO2: 100% 100%    Last Pain:  Vitals:   10/28/23 1453  TempSrc:   PainSc: 6                  Garnette FORBES Skillern

## 2023-10-28 NOTE — Evaluation (Signed)
 Physical Therapy Evaluation Patient Details Name: Tina Aguilar MRN: [id number] DOB: 09/14/1956 Today's Date: 10/28/2023  History of Present Illness  68 y.o. female presents to [facility] hospital on 10/28/2023 for elective R THA. PMH includes HLD, HA, OA, vertigo.  Clinical Impression  Pt presents to PT with deficits in functional mobility, gait, balance, strength, power, endurance. Pt is able to transfer and ambulate without physical assistance with support of RW. PT provides education on surgical hip exercise packet. PT will follow up tomorrow to progress activity tolerance.         If plan is discharge home, recommend the following: A little help with bathing/dressing/bathroom;Assistance with cooking/housework;Assist for transportation;Help with stairs or ramp for entrance   Can travel by private vehicle        Equipment Recommendations None recommended by PT  Recommendations for Other Services       Functional Status Assessment Patient has had a recent decline in their functional status and demonstrates the ability to make significant improvements in function in a reasonable and predictable amount of time.     Precautions / Restrictions Precautions Precautions: Fall Precaution Comments: direct anterior THA Restrictions Weight Bearing Restrictions Per Provider Order: Yes RLE Weight Bearing Per Provider Order: Weight bearing as tolerated      Mobility  Bed Mobility Overal bed mobility: Needs Assistance Bed Mobility: Supine to Sit, Sit to Supine     Supine to sit: Min assist, HOB elevated, Supervision (minA initial attempt, supervision 2nd attempt) Sit to supine: Contact guard assist        Transfers Overall transfer level: Needs assistance Equipment used: Rolling walker (2 wheels) Transfers: Sit to/from Stand Sit to Stand: Contact guard assist                Ambulation/Gait Ambulation/Gait assistance: Contact guard assist Gait Distance (Feet): 150  Feet Assistive device: Rolling walker (2 wheels) Gait Pattern/deviations: Step-through pattern Gait velocity: reduced Gait velocity interpretation: <1.8 ft/sec, indicate of risk for recurrent falls   General Gait Details: slowed step-through gait  Stairs            Wheelchair Mobility     Tilt Bed    Modified Rankin (Stroke Patients Only)       Balance Overall balance assessment: Needs assistance Sitting-balance support: No upper extremity supported, Feet supported Sitting balance-Leahy Scale: Good     Standing balance support: Single extremity supported, Reliant on assistive device for balance Standing balance-Leahy Scale: Poor                               Pertinent Vitals/Pain Pain Assessment Pain Assessment: 0-10 Pain Score: 10-Worst pain ever (5/10 pre-mobility, 10/10 post-ambulation) Pain Location: R hip Pain Descriptors / Indicators: Sore Pain Intervention(s): Patient requesting pain meds-RN notified    Home Living Family/patient expects to be discharged to:: Private residence Living Arrangements: Spouse/significant other (spouse is at ALF currently as pt is his primary caregiver, sister is coming in to assist pt as needed) Available Help at Discharge: Family;Available 24 hours/day Type of Home: House Home Access: Level entry       Home Layout: One level Home Equipment: Agricultural Consultant (2 wheels);Cane - single point;BSC/3in1;Shower seat;Wheelchair - manual      Prior Function Prior Level of Function : Independent/Modified Independent;Driving             Mobility Comments: pt is the primary caregiver for her spouse  Extremity/Trunk Assessment   Upper Extremity Assessment Upper Extremity Assessment: Overall WFL for tasks assessed    Lower Extremity Assessment Lower Extremity Assessment: RLE deficits/detail RLE Deficits / Details: generalized post-op weakness    Cervical / Trunk Assessment Cervical / Trunk Assessment:  Normal  Communication   Communication Communication: No apparent difficulties Cueing Techniques: Verbal cues  Cognition Arousal: Alert Behavior During Therapy: WFL for tasks assessed/performed Overall Cognitive Status: Within Functional Limits for tasks assessed                                          General Comments General comments (skin integrity, edema, etc.): VSS on RA    Exercises Other Exercises Other Exercises: PT provides education on surgical hip exercise packet   Assessment/Plan    PT Assessment Patient needs continued PT services  PT Problem List Decreased strength;Decreased activity tolerance;Decreased balance;Decreased mobility;Decreased knowledge of use of DME;Pain       PT Treatment Interventions DME instruction;Gait training;Stair training;Functional mobility training;Therapeutic activities;Therapeutic exercise;Neuromuscular re-education;Balance training;Patient/family education    PT Goals (Current goals can be found in the Care Plan section)  Acute Rehab PT Goals Patient Stated Goal: to return to independence PT Goal Formulation: With patient Time For Goal Achievement: 11/01/23 Potential to Achieve Goals: Good    Frequency Min 1X/week     Co-evaluation               AM-PAC PT 6 Clicks Mobility  Outcome Measure Help needed turning from your back to your side while in a flat bed without using bedrails?: A Little Help needed moving from lying on your back to sitting on the side of a flat bed without using bedrails?: A Little Help needed moving to and from a bed to a chair (including a wheelchair)?: A Little Help needed standing up from a chair using your arms (e.g., wheelchair or bedside chair)?: A Little Help needed to walk in hospital room?: A Little Help needed climbing 3-5 steps with a railing? : A Little 6 Click Score: 18    End of Session Equipment Utilized During Treatment: Gait belt Activity Tolerance: Patient  tolerated treatment well Patient left: in bed;with call bell/phone within reach Nurse Communication: Mobility status PT Visit Diagnosis: Other abnormalities of gait and mobility (R26.89);Muscle weakness (generalized) (M62.81);Pain Pain - Right/Left: Right Pain - part of body: Hip    Time: 8590-8561 PT Time Calculation (min) (ACUTE ONLY): 29 min   Charges:   PT Evaluation $PT Eval Low Complexity: 1 Low   PT General Charges $$ ACUTE PT VISIT: 1 Visit         Bernardino JINNY Ruth, PT, DPT Acute Rehabilitation Office [phone]   Bernardino JINNY Ruth 10/28/2023, 2:42 PM

## 2023-10-28 NOTE — Op Note (Addendum)
 Operative Note  Date of operation: 10/28/2023 Preoperative diagnosis: Right hip primary osteoarthritis Postoperative diagnosis: Same  Procedure: Right direct anterior total hip arthroplasty  Implants: Implant Name Type Inv. Item Serial No. Manufacturer Lot No. LRB No. Used Action  PIN SECTOR W/GRIP ACE CUP - [id number] Hips PIN SECTOR W/GRIP ACE CUP  [employer] [id number] Right 1 Implanted  LINER NEUTRAL 52X36MM PLUS 4 - [id number] Liner LINER NEUTRAL [id number] PLUS 4  DEPUY [employer]TH[employer] [id number] Right 1 Implanted  STEM FEM ACTIS STD SZ4 - [id number] Stem STEM FEM ACTIS STD SZ4  DEPU[employer] [id number] Right 1 Implanted  HEAD CERAMIC DELTA 36 PLUS 1.5 - [id number] Hips HEAD CERAMIC DELTA 36 PLUS 1.5  [employer] [id number] Right 1 Implanted   Surgeon: Lonni GRADE. Vernetta, MD Assistant: Tory Gaskins, PA-C  Anesthesia: Spinal Antibiotics: IV Ancef  EBL: 200 cc Complications: None  Indications: The patient is an active 68 year old female who unfortunately has debilitating arthritis involving both of her hips.  She has tried both conservative treatment for a long period of time.  At this point her hip pain is daily and is definitely affecting her mobility, her quality of life and actives daily living to the point she does wish to proceed with hip replacement.  She was to proceed with a right side first.  Both hips are equally painful and equally arthritic.  I agree that the right hip should be the one that we start with first.  We did discuss in length in detail the risks of acute blood loss anemia, nerve vessel injury, fracture, infection, DVT, implant failure, wound healing issues, leg length differences, and dislocation.  She understands that our goals are hopefully decreased pain, improved mobility, and improved quality of life.  Procedure description: After informed consent was obtained and the appropriate right hip was marked, the patient was brought to the operating  room and set up on the stretcher where spinal anesthesia was obtained.  She was laid in a supine position on the stretcher and a Foley catheter was placed.  Traction boots were placed on both her feet and next she was placed supine on the Hana fracture table with a perineal post and placed in both legs and inline skeletal traction devices but no traction applied.  Her right operative hip and pelvis were assessed radiographically.  The right hip was prepped and draped with DuraPrep and sterile drapes.  A timeout was called and she was identified as correct patient the correct right hip.  An incision was then made just inferior and posterior to the ASIS and carried slightly obliquely down the leg.  Dissection was carried down to the tensor fascia lata muscle and the tensor fascia was divided longitudinally to proceed with a direct anterior approach to the hip.  Circumflex vessels were identified and cauterized.  The hip capsule identified and opened up in L-type format finding a moderate joint effusion.  There were lateral osteophytes as well.  Cobra retractors were placed around the medial and lateral femoral neck and a femoral neck cut was made with an oscillating saw just proximal to the lesser trochanter.  This cut was completed with an osteotome.  A corkscrew guide was placed in the femoral head the femoral head was removed its entirety and there was a wide area devoid of cartilage.  A bent Hohmann was then placed over the medial sterile rim and remnants of the acetabular labrum and other debris removed including removing periarticular osteophytes around the acetabulum.  Reaming  was then initiated under direct visualization from a size 43 reamer and stepwise increments going up to a size 51 reamer with all reamers placed under direct visualization and the last reamer also placed under direct fluoroscopy in order to obtain the depth reaming, the inclination and anteversion.  The real DePuy sector GRIPTION  acetabular component size 52 was then placed without difficulty under direct visualization and fluoroscopy and then we placed a 36+4 polythene liner for that size 52 acetabular component.  Attention was then turned to the femur.  With the right leg externally rotated to 120 degrees, extended and adducted, a Mueller retractor was placed medially and a Hohmann retractor was placed by the greater trochanter.  The lateral joint capsule was released and a box cutting osteotome was used in her femoral canal.  Broaching was then initiated using the Actis broaching system from a size 0 going to a size 4.  With a size 4 broach in place we trialed a standard offset femoral neck and a 36+1.5 trial head ball.  This was reduced in pelvis and we are pleased with stability on mechanical assessment and radiographic assessment as well as leg length and offset.  We then dislocated the hip and removed the trial components.  We placed the real Actis femoral component size 4 with standard offset and the real 36+1.5 ceramic head ball.  Again this reduced into the acetabulum and we replaced with radiographic and mechanical assessment of the hip.  We then irrigated the soft tissue with normal saline solution.  Remnants of the joint capsule were closed with interrupted #1 Ethibond suture followed by #1 Vicryl close the tensor fascia.  0 Vicryl was used to close the deep tissue and 2-0 Vicryl was used to close subcutaneous tissue.  The skin was closed with staples.  An Aquacel dressing was applied.  The patient was taken off of the Hana table and taken to the recovery room.  Tory Gaskins, PA-C did assist during the entire case from beginning to end and his assistance was crucial and medically necessary for soft tissue management and retraction, helping guide implant placement and a layered closure of the wound.

## 2023-10-28 NOTE — Transfer of Care (Signed)
 Immediate Anesthesia Transfer of Care Note  Patient: Tina Aguilar  Procedure(s) Performed: RIGHT TOTAL HIP ARTHROPLASTY ANTERIOR APPROACH (Right: Hip)  Patient Location: PACU  Anesthesia Type:MAC and Spinal  Level of Consciousness: awake, alert , and oriented  Airway & Oxygen Therapy: Patient Spontanous Breathing and Patient connected to face mask oxygen  Post-op Assessment: Report given to RN and Post -op Vital signs reviewed and stable  Post vital signs: Reviewed and stable  Last Vitals:  Vitals Value Taken Time  BP 106/58 10/28/23 0919  Temp    Pulse 66 10/28/23 0922  Resp 15 10/28/23 0922  SpO2 97 % 10/28/23 0922  Vitals shown include unfiled device data.  Last Pain:  Vitals:   10/28/23 0618  TempSrc:   PainSc: 0-No pain         Complications: No notable events documented.

## 2023-10-28 NOTE — Interval H&P Note (Signed)
 History and Physical Interval Note: The patient understands that she is here today for right total hip replacement to treat her significant right hip arthritis.  This has been no acute or interval change in her medical status.  The risks and benefits of surgery have been discussed in detail and informed consent has been obtained.  The right operative hip has been marked.  10/28/2023 7:02 AM  Tina Aguilar  has presented today for surgery, with the diagnosis of Osteoarthritis Right Hip.  The various methods of treatment have been discussed with the patient and family. After consideration of risks, benefits and other options for treatment, the patient has consented to  Procedure(s): RIGHT TOTAL HIP ARTHROPLASTY ANTERIOR APPROACH (Right) as a surgical intervention.  The patient's history has been reviewed, patient examined, no change in status, stable for surgery.  I have reviewed the patient's chart and labs.  Questions were answered to the patient's satisfaction.     [name]

## 2023-10-29 ENCOUNTER — Encounter (HOSPITAL_COMMUNITY): Payer: Self-pay | Admitting: Orthopaedic Surgery

## 2023-10-29 DIAGNOSIS — E782 Mixed hyperlipidemia: Secondary | ICD-10-CM | POA: Diagnosis not present

## 2023-10-29 DIAGNOSIS — Z87891 Personal history of nicotine dependence: Secondary | ICD-10-CM | POA: Diagnosis not present

## 2023-10-29 DIAGNOSIS — Z79899 Other long term (current) drug therapy: Secondary | ICD-10-CM | POA: Diagnosis not present

## 2023-10-29 DIAGNOSIS — N189 Chronic kidney disease, unspecified: Secondary | ICD-10-CM | POA: Diagnosis not present

## 2023-10-29 DIAGNOSIS — F32A Depression, unspecified: Secondary | ICD-10-CM | POA: Diagnosis not present

## 2023-10-29 DIAGNOSIS — Z860101 Personal history of adenomatous and serrated colon polyps: Secondary | ICD-10-CM | POA: Diagnosis not present

## 2023-10-29 DIAGNOSIS — M1611 Unilateral primary osteoarthritis, right hip: Secondary | ICD-10-CM | POA: Diagnosis not present

## 2023-10-29 LAB — CBC
HCT: 35.8 % — ABNORMAL LOW (ref 36.0–46.0)
Hemoglobin: 11.7 g/dL — ABNORMAL LOW (ref 12.0–15.0)
MCH: 29.6 pg (ref 26.0–34.0)
MCHC: 32.7 g/dL (ref 30.0–36.0)
MCV: 90.6 fL (ref 80.0–100.0)
Platelets: 254 10*3/uL (ref 150–400)
RBC: 3.95 MIL/uL (ref 3.87–5.11)
RDW: 13.3 % (ref 11.5–15.5)
WBC: 12.9 10*3/uL — ABNORMAL HIGH (ref 4.0–10.5)
nRBC: 0 % (ref 0.0–0.2)

## 2023-10-29 LAB — BASIC METABOLIC PANEL
Anion gap: 11 (ref 5–15)
BUN: 10 mg/dL (ref 8–23)
CO2: 22 mmol/L (ref 22–32)
Calcium: 8.7 mg/dL — ABNORMAL LOW (ref 8.9–10.3)
Chloride: 104 mmol/L (ref 98–111)
Creatinine, Ser: 0.65 mg/dL (ref 0.44–1.00)
GFR, Estimated: >60 mL/min (ref >60)
Glucose, Bld: 114 mg/dL — ABNORMAL HIGH (ref 70–99)
Potassium: 3.9 mmol/L (ref 3.5–5.1)
Sodium: 137 mmol/L (ref 135–145)

## 2023-10-29 MED ORDER — OXYCODONE HCL 5 MG PO TABS: 5.0000 mg | ORAL_TABLET | Freq: Four times a day (QID) | ORAL | 0 refills | Status: DC | PRN

## 2023-10-29 MED ORDER — ASPIRIN 81 MG PO CHEW: 81.0000 mg | CHEWABLE_TABLET | Freq: Two times a day (BID) | ORAL | 0 refills | Status: DC

## 2023-10-29 MED ORDER — METHOCARBAMOL 500 MG PO TABS: 500.0000 mg | ORAL_TABLET | Freq: Four times a day (QID) | ORAL | 1 refills | Status: DC | PRN

## 2023-10-29 NOTE — Progress Notes (Signed)
 Physical Therapy Treatment Patient Details Name: Tina Aguilar MRN: [id number] DOB: 1956-09-08 Today's Date: 10/29/2023   History of Present Illness 68 y.o. female presents to [facility] hospital on 10/28/2023 for elective R THA. PMH includes HLD, HA, OA, vertigo.    PT Comments  Pt making steady progress with mobility. Reassured pt that there are no mobility limitations with her hip replacement. Will return for later session and address step into home.     If plan is discharge home, recommend the following: A little help with bathing/dressing/bathroom;Assistance with cooking/housework;Assist for transportation;Help with stairs or ramp for entrance   Can travel by private vehicle        Equipment Recommendations  None recommended by PT    Recommendations for Other Services       Precautions / Restrictions Precautions Precautions: Fall Precaution Comments: direct anterior THA Restrictions Weight Bearing Restrictions Per Provider Order: Yes RLE Weight Bearing Per Provider Order: Weight bearing as tolerated     Mobility  Bed Mobility Overal bed mobility: Needs Assistance Bed Mobility: Supine to Sit, Sit to Supine     Supine to sit: Min assist, HOB elevated, Supervision (Assist to move RLE off of bed)          Transfers Overall transfer level: Needs assistance Equipment used: Rolling walker (2 wheels) Transfers: Sit to/from Stand Sit to Stand: Contact guard assist, Supervision           General transfer comment: Verbal cues for hand placement    Ambulation/Gait Ambulation/Gait assistance: Supervision Gait Distance (Feet): 180 Feet Assistive device: Rolling walker (2 wheels) Gait Pattern/deviations: Step-through pattern, Decreased step length - right, Decreased stance time - right Gait velocity: decr Gait velocity interpretation: 1.31 - 2.62 ft/sec, indicative of limited community ambulator   General Gait Details: Slow gait. verbal cues initially for gait  sequence   Stairs             Wheelchair Mobility     Tilt Bed    Modified Rankin (Stroke Patients Only)       Balance Overall balance assessment: Needs assistance Sitting-balance support: No upper extremity supported, Feet supported Sitting balance-Leahy Scale: Good     Standing balance support: Single extremity supported, Reliant on assistive device for balance Standing balance-Leahy Scale: Poor                              Cognition Arousal: Alert Behavior During Therapy: WFL for tasks assessed/performed Overall Cognitive Status: Within Functional Limits for tasks assessed                                          Exercises Total Joint Exercises Ankle Circles/Pumps: AROM, Both, 10 reps, Supine Quad Sets: Strengthening, Both, 10 reps, Supine Heel Slides: AAROM, Right, 10 reps, Supine Hip ABduction/ADduction: AAROM, Right, 10 reps, Supine Long Arc Quad: Strengthening, Right, 10 reps, Seated    General Comments        Pertinent Vitals/Pain Pain Assessment Pain Assessment: Faces Faces Pain Scale: Hurts even more Pain Location: R hip Pain Descriptors / Indicators: Sore Pain Intervention(s): Limited activity within patient's tolerance, Monitored during session, Repositioned    Home Living                          Prior Function  PT Goals (current goals can now be found in the care plan section) Acute Rehab PT Goals Patient Stated Goal: to return to independence Progress towards PT goals: Progressing toward goals    Frequency    Min 1X/week      PT Plan      Co-evaluation              AM-PAC PT "6 Clicks" Mobility   Outcome Measure  Help needed turning from your back to your side while in a flat bed without using bedrails?: A Little Help needed moving from lying on your back to sitting on the side of a flat bed without using bedrails?: A Little Help needed moving to and from a bed  to a chair (including a wheelchair)?: A Little Help needed standing up from a chair using your arms (e.g., wheelchair or bedside chair)?: A Little Help needed to walk in hospital room?: A Little Help needed climbing 3-5 steps with a railing? : A Little 6 Click Score: 18    End of Session Equipment Utilized During Treatment: Gait belt Activity Tolerance: Patient tolerated treatment well Patient left: with call bell/phone within reach;in chair Nurse Communication: Mobility status PT Visit Diagnosis: Other abnormalities of gait and mobility (R26.89);Muscle weakness (generalized) (M62.81);Pain Pain - Right/Left: Right Pain - part of body: Hip     Time: 1914-7829 PT Time Calculation (min) (ACUTE ONLY): 24 min  Charges:    $Gait Training: 8-22 mins $Therapeutic Exercise: 23-37 mins PT General Charges $$ ACUTE PT VISIT: 1 Visit                     [facility] PT Acute Rehabilitation Services Office [phone]    Pura Browns [facility] 10/29/2023, 10:16 AM

## 2023-10-29 NOTE — Discharge Summary (Signed)
 Patient ID: Tina Aguilar MRN: [id number] DOB/AGE: 69-28-1957 69 y.o.  Admit date: 10/28/2023 Discharge date: 10/29/2023  Admission Diagnoses:  Principal Problem:   Unilateral primary osteoarthritis, right hip Active Problems:   Status post total replacement of right hip   Discharge Diagnoses:  Same  Past Medical History:  Diagnosis Date   Arthritis    hips, hands   Chronic kidney disease    hx of kidney stones   Depression    Frequency of urination    History of adenomatous polyp of colon 08/28/2005   Hyperlipidemia, mixed    on meds   Left ureteral stone    Osteoarthritis    bilateral hips/lower back   Spinal stenosis    Vertigo    Wears glasses     Surgeries: Procedure(s): RIGHT TOTAL HIP ARTHROPLASTY ANTERIOR APPROACH on 10/28/2023   Consultants:   Discharged Condition: Improved  Hospital Course: Tina Aguilar is an 68 y.o. female who was admitted 10/28/2023 for operative treatment ofUnilateral primary osteoarthritis, right hip. Patient has severe unremitting pain that affects sleep, daily activities, and work/hobbies. After pre-op clearance the patient was taken to the operating room on 10/28/2023 and underwent  Procedure(s): RIGHT TOTAL HIP ARTHROPLASTY ANTERIOR APPROACH.    Patient was given perioperative antibiotics:  Anti-infectives (From admission, onward)    Start     Dose/Rate Route Frequency Ordered Stop   10/28/23 1400  ceFAZolin  (ANCEF ) IVPB 2g/100 mL premix        2 g 200 mL/hr over 30 Minutes Intravenous Every 6 hours 10/28/23 1135 10/29/23 1228   10/28/23 0600  ceFAZolin  (ANCEF ) IVPB 2g/100 mL premix        2 g 200 mL/hr over 30 Minutes Intravenous On call to O.R. 10/28/23 9528 10/28/23 0827        Patient was given sequential compression devices, early ambulation, and chemoprophylaxis to prevent DVT.  Patient benefited maximally from hospital stay and there were no complications.    Recent vital signs: Patient Vitals for  the past 24 hrs:  BP Temp Temp src Pulse Resp SpO2  10/29/23 0757 134/62 98 F (36.7 C) Oral 88 -- 95 %  10/29/23 0203 (!) 161/72 97.7 F (36.5 C) Oral 92 20 99 %  10/28/23 2313 (!) 146/88 (!) 97.5 F (36.4 C) Oral 90 20 98 %  10/28/23 1949 (!) 143/88 97.7 F (36.5 C) Oral 97 20 97 %  10/28/23 1600 134/84 98.7 F (37.1 C) -- 85 20 100 %     Recent laboratory studies:  Recent Labs    10/29/23 0709  WBC 12.9*  HGB 11.7*  HCT 35.8*  PLT 254  NA 137  K 3.9  CL 104  CO2 22  BUN 10  CREATININE 0.65  GLUCOSE 114*  CALCIUM 8.7*     Discharge Medications:   Allergies as of 10/29/2023   No Known Allergies      Medication List     TAKE these medications    acetaminophen  650 MG CR tablet Commonly known as: TYLENOL  Take 650 mg by mouth every 8 (eight) hours as needed for pain.   aspirin  81 MG chewable tablet Chew 1 tablet (81 mg total) by mouth 2 (two) times daily.   ezetimibe -simvastatin  10-10 MG tablet Commonly known as: VYTORIN  TAKE 1 TABLET BY MOUTH AT BEDTIME   methocarbamol  500 MG tablet Commonly known as: ROBAXIN  Take 1 tablet (500 mg total) by mouth every 6 (six) hours as needed for muscle spasms.   multivitamin  with minerals Tabs tablet Take 2 tablets by mouth daily.   naproxen sodium 220 MG tablet Commonly known as: ALEVE Take 220 mg by mouth at bedtime as needed (pain).   oxyCODONE  5 MG immediate release tablet Commonly known as: Oxy IR/ROXICODONE  Take 1-2 tablets (5-10 mg total) by mouth every 6 (six) hours as needed for moderate pain (pain score 4-6) (pain score 4-6).               Durable Medical Equipment  (From admission, onward)           Start     Ordered   10/28/23 1136  DME 3 n 1  Once        10/28/23 1135   10/28/23 1136  DME Walker rolling  Once       Question Answer Comment  Walker: With 5 Inch Wheels   Patient needs a walker to treat with the following condition Status post total replacement of right hip       10/28/23 1135            Diagnostic Studies: DG Pelvis Portable Result Date: 10/28/2023 CLINICAL DATA:  Status post total right hip arthroplasty. Images obtained in PACU. EXAM: PORTABLE PELVIS 1-2 VIEWS COMPARISON:  Pelvis and right hip radiographs 11/06/2021 FINDINGS: Interval total right hip arthroplasty. No perihardware lucency is seen to indicate hardware failure or loosening. Expected postoperative changes including lateral right hip subcutaneous air and lateral surgical skin staples. Moderate left femoroacetabular joint space narrowing with high-grade subchondral sclerosis and peripheral osteophytes. Moderate pubic symphysis joint space narrowing with mild peripheral osteophytosis. Mild left-greater-than-right inferior sacroiliac joint space narrowing and subchondral sclerosis. IMPRESSION: 1. Interval total right hip arthroplasty without evidence of hardware failure. 2. Moderate left femoroacetabular osteoarthritis. Electronically Signed   By: Bertina Broccoli M.D.   On: 10/28/2023 11:28   DG HIP UNILAT WITH PELVIS 1V RIGHT Result Date: 10/28/2023 CLINICAL DATA:  Total right hip arthroplasty intraoperative fluoroscopy. EXAM: DG HIP (WITH OR WITHOUT PELVIS) 1V RIGHT COMPARISON:  Pelvis and right hip radiographs 11/06/2021 FINDINGS: Images were performed intraoperatively without the presence of a radiologist. Severe right femoroacetabular joint space narrowing and peripheral osteophytosis. The patient is undergoing total right hip arthroplasty. No hardware complication is seen. Moderate left femoroacetabular joint space narrowing with high-grade peripheral degenerative osteophytes. Total fluoroscopy images: 5 Total fluoroscopy time: 23 seconds Total dose: Radiation Exposure Index (as provided by the fluoroscopic device): 2.17 mGy air Kerma Please see intraoperative findings for further detail. IMPRESSION: Intraoperative fluoroscopy for total right hip arthroplasty. Electronically Signed   By: Bertina Broccoli M.D.   On: 10/28/2023 11:26   DG C-Arm 1-60 Min-No Report Result Date: 10/28/2023 Fluoroscopy was utilized by the requesting physician.  No radiographic interpretation.    Disposition: Discharge disposition: 01-Home or Self Care          Follow-up Information     [facility] Follow up.   Specialty: Home Health Services Why: The home health agency will contact you for the first home visit Contact information: [address] [place] [address] [phone]         Arnie Lao, MD Follow up in 2 week(s).   Specialty: Orthopedic Surgery Contact information: [address] [place]  [place] [address] [phone]                  Signed: Arnie Lao 10/29/2023, 1:50 PM

## 2023-10-29 NOTE — Plan of Care (Signed)
  Problem: Education: Goal: Knowledge of General Education information will improve Description: Including pain rating scale, medication(s)/side effects and non-pharmacologic comfort measures Outcome: Completed/Met   Problem: Health Behavior/Discharge Planning: Goal: Ability to manage health-related needs will improve Outcome: Completed/Met   Problem: Clinical Measurements: Goal: Ability to maintain clinical measurements within normal limits will improve Outcome: Completed/Met Goal: Will remain free from infection Outcome: Completed/Met Goal: Diagnostic test results will improve Outcome: Completed/Met Goal: Respiratory complications will improve Outcome: Completed/Met Goal: Cardiovascular complication will be avoided Outcome: Completed/Met   Problem: Activity: Goal: Risk for activity intolerance will decrease Outcome: Completed/Met   Problem: Nutrition: Goal: Adequate nutrition will be maintained Outcome: Completed/Met   Problem: Coping: Goal: Level of anxiety will decrease Outcome: Completed/Met   Problem: Elimination: Goal: Will not experience complications related to bowel motility Outcome: Completed/Met Goal: Will not experience complications related to urinary retention Outcome: Completed/Met   Problem: Pain Management: Goal: General experience of comfort will improve Outcome: Completed/Met   Problem: Safety: Goal: Ability to remain free from injury will improve Outcome: Completed/Met   Problem: Skin Integrity: Goal: Risk for impaired skin integrity will decrease Outcome: Completed/Met   Problem: Education: Goal: Knowledge of the prescribed therapeutic regimen will improve Outcome: Completed/Met Goal: Understanding of discharge needs will improve Outcome: Completed/Met Goal: Individualized Educational Video(s) Outcome: Completed/Met   Problem: Activity: Goal: Ability to avoid complications of mobility impairment will improve Outcome:  Completed/Met Goal: Ability to tolerate increased activity will improve Outcome: Completed/Met   Problem: Clinical Measurements: Goal: Postoperative complications will be avoided or minimized Outcome: Completed/Met   Problem: Pain Management: Goal: Pain level will decrease with appropriate interventions Outcome: Completed/Met   Problem: Skin Integrity: Goal: Will show signs of wound healing Outcome: Completed/Met

## 2023-10-29 NOTE — Progress Notes (Signed)
 Subjective: 1 Day Post-Op Procedure(s) (LRB): RIGHT TOTAL HIP ARTHROPLASTY ANTERIOR APPROACH (Right) Patient reports pain as moderate.    Objective: Vital signs in last 24 hours: Temp:  [97.2 F (36.2 C)-98.7 F (37.1 C)] 97.7 F (36.5 C) (01/15 0203) Pulse Rate:  [58-97] 92 (01/15 0203) Resp:  [10-20] 20 (01/15 0203) BP: (102-161)/(58-88) 161/72 (01/15 0203) SpO2:  [95 %-100 %] 99 % (01/15 0203)  Intake/Output from previous day: 01/14 0701 - 01/15 0700 In: 1860 [P.O.:960; I.V.:900] Out: 1250 [Urine:1050; Blood:200] Intake/Output this shift: No intake/output data recorded.  No results for input(s): "HGB" in the last 72 hours. No results for input(s): "WBC", "RBC", "HCT", "PLT" in the last 72 hours. No results for input(s): "NA", "K", "CL", "CO2", "BUN", "CREATININE", "GLUCOSE", "CALCIUM" in the last 72 hours. No results for input(s): "LABPT", "INR" in the last 72 hours.  Neurovascular intact Sensation intact distally Intact pulses distally Dorsiflexion/Plantar flexion intact Incision: scant drainage   Assessment/Plan: 1 Day Post-Op Procedure(s) (LRB): RIGHT TOTAL HIP ARTHROPLASTY ANTERIOR APPROACH (Right) Up with therapy Discharge home with home health      Tina Aguilar 10/29/2023, 7:45 AM

## 2023-10-29 NOTE — Progress Notes (Signed)
 Patient awaiting family for discharge home, Patient in no acute distress nor complaints of pain nor discomfort; incision on back is clean, dry and intact; No c/o pain at this time. Room was checked and accounted for all patient's belongings; discharge instructions concerning her medications, incision care, follow up appointment and when to call the doctor as needed were all discussed with patient by RN and she expressed understanding on the instructions given.

## 2023-10-29 NOTE — Discharge Instructions (Signed)
 Per [facility] clinic policy, our goal is ensure optimal postoperative pain control with a multimodal pain management strategy. For all [facility] patients, our goal is to wean post-operative narcotic medications by 6 weeks post-operatively. If this is not possible due to utilization of pain medication prior to surgery, your [facility] doctor will support your acute post-operative pain control for the first 6 weeks postoperatively, with a plan to transition you back to your primary pain team following that. Tina Aguilar will work to ensure a Therapist, occupational.  INSTRUCTIONS AFTER JOINT REPLACEMENT   Remove items at home which could result in a fall. This includes throw rugs or furniture in walking pathways ICE to the affected joint every three hours while awake for 30 minutes at a time, for at least the first 3-5 days, and then as needed for pain and swelling.  Continue to use ice for pain and swelling. You may notice swelling that will progress down to the foot and ankle.  This is normal after surgery.  Elevate your leg when you are not up walking on it.   Continue to use the breathing machine you got in the hospital (incentive spirometer) which will help keep your temperature down.  It is common for your temperature to cycle up and down following surgery, especially at night when you are not up moving around and exerting yourself.  The breathing machine keeps your lungs expanded and your temperature down.   DIET:  As you were doing prior to hospitalization, we recommend a well-balanced diet.  DRESSING / WOUND CARE / SHOWERING  Keep the surgical dressing until follow up.  The dressing is water proof, so you can shower without any extra covering.  IF THE DRESSING FALLS OFF or the wound gets wet inside, change the dressing with sterile gauze.  Please use good hand washing techniques before changing the dressing.  Do not use any lotions or creams on the incision until instructed by your surgeon.     ACTIVITY  Increase activity slowly as tolerated, but follow the weight bearing instructions below.   No driving for 6 weeks or until further direction given by your physician.  You cannot drive while taking narcotics.  No lifting or carrying greater than 10 lbs. until further directed by your surgeon. Avoid periods of inactivity such as sitting longer than an hour when not asleep. This helps prevent blood clots.  You may return to work once you are authorized by your doctor.     WEIGHT BEARING   Weight bearing as tolerated with assist device (walker, cane, etc) as directed, use it as long as suggested by your surgeon or therapist, typically at least 4-6 weeks.   EXERCISES  Results after joint replacement surgery are often greatly improved when you follow the exercise, range of motion and muscle strengthening exercises prescribed by your doctor. Safety measures are also important to protect the joint from further injury. Any time any of these exercises cause you to have increased pain or swelling, decrease what you are doing until you are comfortable again and then slowly increase them. If you have problems or questions, call your caregiver or physical therapist for advice.   Rehabilitation is important following a joint replacement. After just a few days of immobilization, the muscles of the leg can become weakened and shrink (atrophy).  These exercises are designed to build up the tone and strength of the thigh and leg muscles and to improve motion. Often times heat used for twenty to thirty minutes before  working out will loosen up your tissues and help with improving the range of motion but do not use heat for the first two weeks following surgery (sometimes heat can increase post-operative swelling).   These exercises can be done on a training (exercise) mat, on the floor, on a table or on a bed. Use whatever works the best and is most comfortable for you.    Use music or television  while you are exercising so that the exercises are a pleasant break in your day. This will make your life better with the exercises acting as a break in your routine that you can look forward to.   Perform all exercises about fifteen times, three times per day or as directed.  You should exercise both the operative leg and the other leg as well.  Exercises include:   Quad Sets - Tighten up the muscle on the front of the thigh (Quad) and hold for 5-10 seconds.   Straight Leg Raises - With your knee straight (if you were given a brace, keep it on), lift the leg to 60 degrees, hold for 3 seconds, and slowly lower the leg.  Perform this exercise against resistance later as your leg gets stronger.  Leg Slides: Lying on your back, slowly slide your foot toward your buttocks, bending your knee up off the floor (only go as far as is comfortable). Then slowly slide your foot back down until your leg is flat on the floor again.  Angel Wings: Lying on your back spread your legs to the side as far apart as you can without causing discomfort.  Hamstring Strength:  Lying on your back, push your heel against the floor with your leg straight by tightening up the muscles of your buttocks.  Repeat, but this time bend your knee to a comfortable angle, and push your heel against the floor.  You may put a pillow under the heel to make it more comfortable if necessary.   A rehabilitation program following joint replacement surgery can speed recovery and prevent re-injury in the future due to weakened muscles. Contact your doctor or a physical therapist for more information on knee rehabilitation.    CONSTIPATION  Constipation is defined medically as fewer than three stools per week and severe constipation as less than one stool per week.  Even if you have a regular bowel pattern at home, your normal regimen is likely to be disrupted due to multiple reasons following surgery.  Combination of anesthesia, postoperative  narcotics, change in appetite and fluid intake all can affect your bowels.   YOU MUST use at least one of the following options; they are listed in order of increasing strength to get the job done.  They are all available over the counter, and you may need to use some, POSSIBLY even all of these options:    Drink plenty of fluids (prune juice may be helpful) and high fiber foods Colace 100 mg by mouth twice a day  Senokot for constipation as directed and as needed Dulcolax (bisacodyl), take with full glass of water  Miralax (polyethylene glycol) once or twice a day as needed.  If you have tried all these things and are unable to have a bowel movement in the first 3-4 days after surgery call either your surgeon or your primary doctor.    If you experience loose stools or diarrhea, hold the medications until you stool forms back up.  If your symptoms do not get better within 1 week  or if they get worse, check with your doctor.  If you experience "the worst abdominal pain ever" or develop nausea or vomiting, please contact the office immediately for further recommendations for treatment.   ITCHING:  If you experience itching with your medications, try taking only a single pain pill, or even half a pain pill at a time.  You can also use Benadryl over the counter for itching or also to help with sleep.   TED HOSE STOCKINGS:  Use stockings on both legs until for at least 2 weeks or as directed by physician office. They may be removed at night for sleeping.  MEDICATIONS:  See your medication summary on the "After Visit Summary" that nursing will review with you.  You may have some home medications which will be placed on hold until you complete the course of blood thinner medication.  It is important for you to complete the blood thinner medication as prescribed.  PRECAUTIONS:  If you experience chest pain or shortness of breath - call 911 immediately for transfer to the hospital emergency department.    If you develop a fever greater that 101 F, purulent drainage from wound, increased redness or drainage from wound, foul odor from the wound/dressing, or calf pain - CONTACT YOUR SURGEON.                                                   FOLLOW-UP APPOINTMENTS:  If you do not already have a post-op appointment, please call the office for an appointment to be seen by your surgeon.  Guidelines for how soon to be seen are listed in your "After Visit Summary", but are typically between 1-4 weeks after surgery.  OTHER INSTRUCTIONS:   Knee Replacement:  Do not place pillow under knee, focus on keeping the knee straight while resting. CPM instructions: 0-90 degrees, 2 hours in the morning, 2 hours in the afternoon, and 2 hours in the evening. Place foam block, curve side up under heel at all times except when in CPM or when walking.  DO NOT modify, tear, cut, or change the foam block in any way.  POST-OPERATIVE OPIOID TAPER INSTRUCTIONS: It is important to wean off of your opioid medication as soon as possible. If you do not need pain medication after your surgery it is ok to stop day one. Opioids include: Codeine, Hydrocodone(Norco, Vicodin), Oxycodone(Percocet, oxycontin) and hydromorphone amongst others.  Long term and even short term use of opiods can cause: Increased pain response Dependence Constipation Depression Respiratory depression And more.  Withdrawal symptoms can include Flu like symptoms Nausea, vomiting And more Techniques to manage these symptoms Hydrate well Eat regular healthy meals Stay active Use relaxation techniques(deep breathing, meditating, yoga) Do Not substitute Alcohol to help with tapering If you have been on opioids for less than two weeks and do not have pain than it is ok to stop all together.  Plan to wean off of opioids This plan should start within one week post op of your joint replacement. Maintain the same interval or time between taking each dose  and first decrease the dose.  Cut the total daily intake of opioids by one tablet each day Next start to increase the time between doses. The last dose that should be eliminated is the evening dose.   MAKE SURE YOU:  Understand these instructions.  Get help right away if you are not doing well or get worse.    Thank you for letting us be a part of your medical care team.  It is a privilege we respect greatly.  We hope these instructions will help you stay on track for a fast and full recovery!      Dental Antibiotics:  In most cases prophylactic antibiotics for Dental procdeures after total joint surgery are not necessary.  Exceptions are as follows:  1. History of prior total joint infection  2. Severely immunocompromised (Organ Transplant, cancer chemotherapy, Rheumatoid biologic meds such as Humera)  3. Poorly controlled diabetes (A1C &gt; 8.0, blood glucose over 200)  If you have one of these conditions, contact your surgeon for an antibiotic prescription, prior to your dental procedure.

## 2023-10-29 NOTE — Progress Notes (Signed)
 Physical Therapy Treatment Patient Details Name: Tina Aguilar MRN: [id number] DOB: 12/06/1955 Today's Date: 10/29/2023   History of Present Illness 68 y.o. female presents [facility] hospital on 10/28/2023 for elective R THA. PMH includes HLD, HA, OA, vertigo.    PT Comments  Pt doing well with mobility. Pain is primary limiting factor. Able to manage basic mobility including a step. Ready for dc home with sister's assist.     If plan is discharge home, recommend the following: A little help with bathing/dressing/bathroom;Assistance with cooking/housework;Assist for transportation;Help with stairs or ramp for entrance   Can travel by private vehicle        Equipment Recommendations  None recommended by PT    Recommendations for Other Services       Precautions / Restrictions Precautions Precautions: Fall Precaution Comments: direct anterior THA Restrictions Weight Bearing Restrictions Per Provider Order: Yes RLE Weight Bearing Per Provider Order: Weight bearing as tolerated     Mobility  Bed Mobility Overal bed mobility: Needs Assistance Bed Mobility: Supine to Sit, Sit to Supine     Supine to sit: Min assist, HOB elevated, Supervision (Assist to move RLE off of bed)     General bed mobility comments: Pt up in chair    Transfers Overall transfer level: Needs assistance Equipment used: Rolling walker (2 wheels) Transfers: Sit to/from Stand Sit to Stand: Modified independent (Device/Increase time)           General transfer comment: Verbal cues for hand placement    Ambulation/Gait Ambulation/Gait assistance: Supervision Gait Distance (Feet): 150 Feet Assistive device: Rolling walker (2 wheels) Gait Pattern/deviations: Step-through pattern, Decreased step length - right, Decreased stance time - right Gait velocity: decr Gait velocity interpretation: 1.31 - 2.62 ft/sec, indicative of limited community ambulator   General Gait Details: Pt with decr hip  flexion during swing phase   Stairs Stairs: Yes Stairs assistance: Contact guard assist Stair Management: Two rails, Step to pattern, Forwards Number of Stairs: 1 General stair comments: Used portable step with walker   Wheelchair Mobility     Tilt Bed    Modified Rankin (Stroke Patients Only)       Balance Overall balance assessment: Needs assistance Sitting-balance support: No upper extremity supported, Feet supported Sitting balance-Leahy Scale: Good     Standing balance support: No upper extremity supported Standing balance-Leahy Scale: Fair                              Cognition Arousal: Alert Behavior During Therapy: WFL for tasks assessed/performed Overall Cognitive Status: Within Functional Limits for tasks assessed                                          Exercises Total Joint Exercises Ankle Circles/Pumps: AROM, Both, 10 reps, Supine Quad Sets: Strengthening, Both, 10 reps, Supine Heel Slides: AAROM, Right, 10 reps, Supine Hip ABduction/ADduction: AAROM, Right, 10 reps, Supine Long Arc Quad: Strengthening, Right, 10 reps, Seated Knee Flexion: AROM, Right, 10 reps, Standing Marching in Standing: AROM, Right, 10 reps, Standing    General Comments        Pertinent Vitals/Pain Pain Assessment Pain Assessment: Faces Faces Pain Scale: Hurts even more Pain Location: R hip Pain Descriptors / Indicators: Sore Pain Intervention(s): Limited activity within patient's tolerance, Monitored during session    Home Living  Prior Function            PT Goals (current goals can now be found in the care plan section) Acute Rehab PT Goals Patient Stated Goal: to return to independence Progress towards PT goals: Progressing toward goals    Frequency    Min 1X/week      PT Plan      Co-evaluation              AM-PAC PT "6 Clicks" Mobility   Outcome Measure  Help needed  turning from your back to your side while in a flat bed without using bedrails?: A Little Help needed moving from lying on your back to sitting on the side of a flat bed without using bedrails?: A Little Help needed moving to and from a bed to a chair (including a wheelchair)?: A Little Help needed standing up from a chair using your arms (e.g., wheelchair or bedside chair)?: None Help needed to walk in hospital room?: A Little Help needed climbing 3-5 steps with a railing? : A Little 6 Click Score: 19    End of Session Equipment Utilized During Treatment: Gait belt Activity Tolerance: Patient tolerated treatment well Patient left: with call bell/phone within reach;in chair Nurse Communication: Mobility status PT Visit Diagnosis: Other abnormalities of gait and mobility (R26.89);Muscle weakness (generalized) (M62.81);Pain Pain - Right/Left: Right Pain - part of body: Hip     Time: 1202-1217 PT Time Calculation (min) (ACUTE ONLY): 15 min  Charges:    $Gait Training: 8-22 mins $Therapeutic Exercise: 23-37 mins PT General Charges $$ ACUTE PT VISIT: 1 Visit                     [facility] PT Acute Rehabilitation Services Office [phone]    [facility] 10/29/2023, 12:26 PM

## 2023-10-30 DIAGNOSIS — M4800 Spinal stenosis, site unspecified: Secondary | ICD-10-CM | POA: Diagnosis not present

## 2023-10-30 DIAGNOSIS — M47816 Spondylosis without myelopathy or radiculopathy, lumbar region: Secondary | ICD-10-CM | POA: Diagnosis not present

## 2023-10-30 DIAGNOSIS — E782 Mixed hyperlipidemia: Secondary | ICD-10-CM | POA: Diagnosis not present

## 2023-10-30 DIAGNOSIS — M19041 Primary osteoarthritis, right hand: Secondary | ICD-10-CM | POA: Diagnosis not present

## 2023-10-30 DIAGNOSIS — K219 Gastro-esophageal reflux disease without esophagitis: Secondary | ICD-10-CM | POA: Diagnosis not present

## 2023-10-30 DIAGNOSIS — M19042 Primary osteoarthritis, left hand: Secondary | ICD-10-CM | POA: Diagnosis not present

## 2023-10-30 DIAGNOSIS — Z471 Aftercare following joint replacement surgery: Secondary | ICD-10-CM | POA: Diagnosis not present

## 2023-10-30 DIAGNOSIS — N189 Chronic kidney disease, unspecified: Secondary | ICD-10-CM | POA: Diagnosis not present

## 2023-10-30 DIAGNOSIS — M1612 Unilateral primary osteoarthritis, left hip: Secondary | ICD-10-CM | POA: Diagnosis not present

## 2023-11-02 DIAGNOSIS — M47816 Spondylosis without myelopathy or radiculopathy, lumbar region: Secondary | ICD-10-CM | POA: Diagnosis not present

## 2023-11-02 DIAGNOSIS — E782 Mixed hyperlipidemia: Secondary | ICD-10-CM | POA: Diagnosis not present

## 2023-11-02 DIAGNOSIS — M19042 Primary osteoarthritis, left hand: Secondary | ICD-10-CM | POA: Diagnosis not present

## 2023-11-02 DIAGNOSIS — M1612 Unilateral primary osteoarthritis, left hip: Secondary | ICD-10-CM | POA: Diagnosis not present

## 2023-11-02 DIAGNOSIS — K219 Gastro-esophageal reflux disease without esophagitis: Secondary | ICD-10-CM | POA: Diagnosis not present

## 2023-11-02 DIAGNOSIS — M4800 Spinal stenosis, site unspecified: Secondary | ICD-10-CM | POA: Diagnosis not present

## 2023-11-02 DIAGNOSIS — N189 Chronic kidney disease, unspecified: Secondary | ICD-10-CM | POA: Diagnosis not present

## 2023-11-02 DIAGNOSIS — Z471 Aftercare following joint replacement surgery: Secondary | ICD-10-CM | POA: Diagnosis not present

## 2023-11-02 DIAGNOSIS — M19041 Primary osteoarthritis, right hand: Secondary | ICD-10-CM | POA: Diagnosis not present

## 2023-11-04 DIAGNOSIS — N189 Chronic kidney disease, unspecified: Secondary | ICD-10-CM | POA: Diagnosis not present

## 2023-11-04 DIAGNOSIS — M19041 Primary osteoarthritis, right hand: Secondary | ICD-10-CM | POA: Diagnosis not present

## 2023-11-04 DIAGNOSIS — M47816 Spondylosis without myelopathy or radiculopathy, lumbar region: Secondary | ICD-10-CM | POA: Diagnosis not present

## 2023-11-04 DIAGNOSIS — M1612 Unilateral primary osteoarthritis, left hip: Secondary | ICD-10-CM | POA: Diagnosis not present

## 2023-11-04 DIAGNOSIS — M19042 Primary osteoarthritis, left hand: Secondary | ICD-10-CM | POA: Diagnosis not present

## 2023-11-04 DIAGNOSIS — M4800 Spinal stenosis, site unspecified: Secondary | ICD-10-CM | POA: Diagnosis not present

## 2023-11-04 DIAGNOSIS — K219 Gastro-esophageal reflux disease without esophagitis: Secondary | ICD-10-CM | POA: Diagnosis not present

## 2023-11-04 DIAGNOSIS — E782 Mixed hyperlipidemia: Secondary | ICD-10-CM | POA: Diagnosis not present

## 2023-11-04 DIAGNOSIS — Z471 Aftercare following joint replacement surgery: Secondary | ICD-10-CM | POA: Diagnosis not present

## 2023-11-06 DIAGNOSIS — M4800 Spinal stenosis, site unspecified: Secondary | ICD-10-CM | POA: Diagnosis not present

## 2023-11-06 DIAGNOSIS — Z471 Aftercare following joint replacement surgery: Secondary | ICD-10-CM | POA: Diagnosis not present

## 2023-11-06 DIAGNOSIS — M19042 Primary osteoarthritis, left hand: Secondary | ICD-10-CM | POA: Diagnosis not present

## 2023-11-06 DIAGNOSIS — E782 Mixed hyperlipidemia: Secondary | ICD-10-CM | POA: Diagnosis not present

## 2023-11-06 DIAGNOSIS — K219 Gastro-esophageal reflux disease without esophagitis: Secondary | ICD-10-CM | POA: Diagnosis not present

## 2023-11-06 DIAGNOSIS — M19041 Primary osteoarthritis, right hand: Secondary | ICD-10-CM | POA: Diagnosis not present

## 2023-11-06 DIAGNOSIS — M47816 Spondylosis without myelopathy or radiculopathy, lumbar region: Secondary | ICD-10-CM | POA: Diagnosis not present

## 2023-11-06 DIAGNOSIS — N189 Chronic kidney disease, unspecified: Secondary | ICD-10-CM | POA: Diagnosis not present

## 2023-11-06 DIAGNOSIS — M1612 Unilateral primary osteoarthritis, left hip: Secondary | ICD-10-CM | POA: Diagnosis not present

## 2023-11-09 DIAGNOSIS — M19042 Primary osteoarthritis, left hand: Secondary | ICD-10-CM | POA: Diagnosis not present

## 2023-11-09 DIAGNOSIS — E782 Mixed hyperlipidemia: Secondary | ICD-10-CM | POA: Diagnosis not present

## 2023-11-09 DIAGNOSIS — M47816 Spondylosis without myelopathy or radiculopathy, lumbar region: Secondary | ICD-10-CM | POA: Diagnosis not present

## 2023-11-09 DIAGNOSIS — K219 Gastro-esophageal reflux disease without esophagitis: Secondary | ICD-10-CM | POA: Diagnosis not present

## 2023-11-09 DIAGNOSIS — Z471 Aftercare following joint replacement surgery: Secondary | ICD-10-CM | POA: Diagnosis not present

## 2023-11-09 DIAGNOSIS — M4800 Spinal stenosis, site unspecified: Secondary | ICD-10-CM | POA: Diagnosis not present

## 2023-11-09 DIAGNOSIS — N189 Chronic kidney disease, unspecified: Secondary | ICD-10-CM | POA: Diagnosis not present

## 2023-11-09 DIAGNOSIS — M19041 Primary osteoarthritis, right hand: Secondary | ICD-10-CM | POA: Diagnosis not present

## 2023-11-09 DIAGNOSIS — M1612 Unilateral primary osteoarthritis, left hip: Secondary | ICD-10-CM | POA: Diagnosis not present

## 2023-11-10 ENCOUNTER — Telehealth: Payer: Self-pay

## 2023-11-10 ENCOUNTER — Encounter: Payer: Self-pay | Admitting: Orthopaedic Surgery

## 2023-11-10 ENCOUNTER — Ambulatory Visit (INDEPENDENT_AMBULATORY_CARE_PROVIDER_SITE_OTHER): Payer: Federal, State, Local not specified - PPO | Admitting: Orthopaedic Surgery

## 2023-11-10 DIAGNOSIS — Z96641 Presence of right artificial hip joint: Secondary | ICD-10-CM

## 2023-11-10 NOTE — Telephone Encounter (Signed)
Patient lef t message on triage phone wanting to know if she is able to stop wearing surgical socks and make a F/U appt.  CB# [phone].  Please advise.  Thank you.

## 2023-11-10 NOTE — Progress Notes (Signed)
The patient is here for her first postoperative visit 2 weeks status post a right total hip replacement to treat severe right hip arthritis.  She has been compliant with a baby aspirin twice daily.  She is only taken Tylenol for pain.  She is ambulating using a cane.  She does report that she had significant pain just after surgery but overall she has made improvements in today.  Most of her pain was mainly in the hospital.  On exam her right hip incision looks good.  The staples have been removed and Steri-Strips applied.  Her leg lengths are near equal.  She will continue to slowly increase her activities as comfort allows.  She can attempt driving from my standpoint but should practice and get out of the road when she is comfortable.  She can stop her baby aspirin and get back on her Vytorin.  We will see her back in a month to see how she is doing overall but no x-rays are needed.

## 2023-12-11 ENCOUNTER — Ambulatory Visit (INDEPENDENT_AMBULATORY_CARE_PROVIDER_SITE_OTHER): Payer: Medicare PPO | Admitting: Orthopaedic Surgery

## 2023-12-11 ENCOUNTER — Encounter: Payer: Self-pay | Admitting: Orthopaedic Surgery

## 2023-12-11 DIAGNOSIS — Z96641 Presence of right artificial hip joint: Secondary | ICD-10-CM

## 2023-12-11 NOTE — Progress Notes (Signed)
 The patient is a 68 year old female who is here today in follow-up as it relates to her right total hip arthroplasty.  She is now 6 weeks out from surgery.  She has known arthritis in her left hip and that is bothering her some.  She reports that she is doing great with the right hip.  She is performing her ADLs easier as well.  Her right operative hip actually move smoothly and fluidly and was better than her left hip.  Her left hip as stiffness with internal and external rotation and some pain in the groin.  She will continue to increase her activities as comfort allows with no restrictions.  She is back to 2 different part-time jobs.  We will see her in 3 months and at that visit we will have just a standing AP pelvis.

## 2024-02-12 ENCOUNTER — Ambulatory Visit: Payer: Medicare Other

## 2024-02-12 VITALS — Ht 63.5 in | Wt 168.0 lb

## 2024-02-12 DIAGNOSIS — Z0000 Encounter for general adult medical examination without abnormal findings: Secondary | ICD-10-CM | POA: Diagnosis not present

## 2024-02-12 NOTE — Patient Instructions (Signed)
 Tina Aguilar , Thank you for taking time to come for your Medicare Wellness Visit. I appreciate your ongoing commitment to your health goals. Please review the following plan we discussed and let me know if I can assist you in the future.   Referrals/Orders/Follow-Ups/Clinician Recommendations: continue to work on weight loss Aim for 30 minutes of exercise or brisk walking, 6-8 glasses of water, and 5 servings of fruits and vegetables each day.   This is a list of the screening recommended for you and due dates:  Health Maintenance  Topic Date Due   COVID-19 Vaccine (6 - 2024-25 season) 06/15/2023   DTaP/Tdap/Td vaccine (2 - Td or Tdap) 05/25/2024   Medicare Annual Wellness Visit  02/11/2025   Mammogram  07/17/2025   Colon Cancer Screening  06/28/2031   Pneumonia Vaccine  Completed   DEXA scan (bone density measurement)  Completed   Hepatitis C Screening  Completed   Zoster (Shingles) Vaccine  Completed   HPV Vaccine  Aged Out   Meningitis B Vaccine  Aged Out   Flu Shot  Discontinued    Advanced directives: (Copy Requested) Please bring a copy of your health care power of attorney and living will to the office to be added to your chart at your convenience. You can mail to [facility] [address] [place], [place] [address] or email to [email or web address]  Next Medicare Annual Wellness Visit scheduled for next year: Yes

## 2024-02-12 NOTE — Progress Notes (Signed)
 Subjective:   Tina Aguilar is a 68 y.o. who presents for a Medicare Wellness preventive visit.  Visit Complete: Virtual I connected with  Ricky Charter on 02/12/24 by a audio enabled telemedicine application and verified that I am speaking with the correct person using two identifiers.  Patient Location: Home  Provider Location: Office/Clinic  I discussed the limitations of evaluation and management by telemedicine. The patient expressed understanding and agreed to proceed.  Vital Signs: Because this visit was a virtual/telehealth visit, some criteria may be missing or patient reported. Any vitals not documented were not able to be obtained and vitals that have been documented are patient reported.  VideoDeclined- This patient declined Librarian, academic. Therefore the visit was completed with audio only.  Persons Participating in Visit: Patient.  AWV Questionnaire: No: Patient Medicare AWV questionnaire was not completed prior to this visit.  Cardiac Risk Factors include: advanced age (>65men, >59 women);dyslipidemia     Objective:    Today's Vitals   02/12/24 1523  Weight: 168 lb (76.2 kg)  Height: 5' 3.5" (1.613 m)   Body mass index is 29.29 kg/m.     02/12/2024    3:28 PM 10/20/2023    3:12 PM 02/06/2023    4:24 PM 11/06/2021    8:05 AM 03/12/2020   10:08 AM 08/10/2018   10:24 AM 05/06/2017   10:53 AM  Advanced Directives  Does Patient Have a Medical Advance Directive? Yes No No Yes No No Yes  Type of Estate agent of [place];Living will   Living will   Healthcare Power of Attorney  Does patient want to make changes to medical advance directive?       No - Patient declined  Copy of Healthcare Power of Attorney in Chart? No - copy requested      No - copy requested  Would patient like information on creating a medical advance directive?  No - Patient declined No - Patient declined   No - Patient  declined     Current Medications (verified) Outpatient Encounter Medications as of 02/12/2024  Medication Sig   naproxen sodium (ALEVE) 220 MG tablet Take 220 mg by mouth at bedtime as needed (pain).   ezetimibe -simvastatin  (VYTORIN ) 10-10 MG tablet TAKE 1 TABLET BY MOUTH AT BEDTIME   [DISCONTINUED] acetaminophen  (TYLENOL ) 650 MG CR tablet Take 650 mg by mouth every 8 (eight) hours as needed for pain.   [DISCONTINUED] aspirin  81 MG chewable tablet Chew 1 tablet (81 mg total) by mouth 2 (two) times daily.   [DISCONTINUED] methocarbamol  (ROBAXIN ) 500 MG tablet Take 1 tablet (500 mg total) by mouth every 6 (six) hours as needed for muscle spasms.   [DISCONTINUED] Multiple Vitamin (MULTIVITAMIN WITH MINERALS) TABS tablet Take 2 tablets by mouth daily.   [DISCONTINUED] oxyCODONE  (OXY IR/ROXICODONE ) 5 MG immediate release tablet Take 1-2 tablets (5-10 mg total) by mouth every 6 (six) hours as needed for moderate pain (pain score 4-6) (pain score 4-6).   No facility-administered encounter medications on file as of 02/12/2024.    Allergies (verified) Patient has no known allergies.   History: Past Medical History:  Diagnosis Date   Arthritis    hips, hands   Chronic kidney disease    hx of kidney stones   Depression    Frequency of urination    History of adenomatous polyp of colon 08/28/2005   Hyperlipidemia, mixed    on meds   Left ureteral stone  Osteoarthritis    bilateral hips/lower back   Spinal stenosis    Vertigo    Wears glasses    Past Surgical History:  Procedure Laterality Date   CESAREAN SECTION  1991   and 1993   COLONOSCOPY  2012   JP-F/V-movi(exc)-normal   CYSTOSCOPY/URETEROSCOPY/HOLMIUM LASER/STENT PLACEMENT Left 05/06/2017   Procedure: CYSTOSCOPY/URETEROSCOPY/HOLMIUM LASER/STENT PLACEMENT;  Surgeon: Bart Born, MD;  Location[facility];  Service: Urology;  Laterality: Left;   DILATION AND CURETTAGE OF UTERUS  1980   w/ Suction for  miscarriage  x2   TOTAL HIP ARTHROPLASTY Right 10/28/2023   Procedure: RIGHT TOTAL HIP ARTHROPLASTY ANTERIOR APPROACH;  Surgeon: Arnie Lao, MD;  Location: [facility] OR;  Service: Orthopedics;  Laterality: Right;   WISDOM TOOTH EXTRACTION  1976   Family History  Problem Relation Age of Onset   Coronary artery disease Mother    Arthritis Mother    Congestive Heart Failure Father    Heart attack Father    Hyperlipidemia Sister    Diabetes Sister    Heart disease Sister    Hyperlipidemia Sister    Hyperlipidemia Sister    Other Brother        coronary artery bypass graft age 42   Hyperlipidemia Brother    Diabetes Brother    Heart attack Maternal Aunt        at age 15   Breast cancer Neg Hx    Colon polyps Neg Hx    Colon cancer Neg Hx    Esophageal cancer Neg Hx    Rectal cancer Neg Hx    Stomach cancer Neg Hx    Social History   Socioeconomic History   Marital status: Married    Spouse name: Not on file   Number of children: 2   Years of education: Not on file   Highest education level: Not on file  Occupational History   Not on file  Tobacco Use   Smoking status: Former    Current packs/day: 0.00    Average packs/day: 1 pack/day for 5.0 years (5.0 ttl pk-yrs)    Types: Cigarettes    Start date: 04/30/1972    Quit date: 04/30/1977    Years since quitting: 46.8   Smokeless tobacco: Never  Vaping Use   Vaping status: Never Used  Substance and Sexual Activity   Alcohol use: No    Alcohol/week: 0.0 standard drinks of alcohol   Drug use: No   Sexual activity: Not on file  Other Topics Concern   Not on file  Social History Narrative   [employer], [employer]   Married '84   Two grown daughters '91 '93   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   SO is in 70's and in good health, has RA   Social Drivers of Corporate investment banker Strain: Low Risk  (02/12/2024)   Overall Financial Resource Strain (CARDIA)    Difficulty of Paying Living Expenses:  Not hard at all  Food Insecurity: No Food Insecurity (02/12/2024)   Hunger Vital Sign    Worried About Running Out of Food in the Last Year: Never true    Ran Out of Food in the Last Year: Never true  Transportation Needs: No Transportation Needs (02/12/2024)   PRAPARE - Administrator, Civil Service (Medical): No    Lack of Transportation (Non-Medical): No  Physical Activity: Sufficiently Active (02/12/2024)   Exercise Vital Sign    Days of Exercise  per Week: 7 days    Minutes of Exercise per Session: 50 min  Stress: No Stress Concern Present (02/12/2024)   [employer] of Occupational Health - Occupational Stress Questionnaire    Feeling of Stress : Only a little  Social Connections: Moderately Integrated (02/12/2024)   Social Connection and Isolation Panel [NHANES]    Frequency of Communication with Friends and Family: More than three times a week    Frequency of Social Gatherings with Friends and Family: More than three times a week    Attends Religious Services: More than 4 times per year    Active Member of [employer] or Organizations: No    Attends Engineer, structural: Never    Marital Status: Married    Tobacco Counseling Counseling given: Not Answered    Clinical Intake:  Pre-visit preparation completed: Yes  Pain : No/denies pain     BMI - recorded: 29.29 Nutritional Status: BMI 25 -29 Overweight Nutritional Risks: None Diabetes: No  Lab Results  Component Value Date   HGBA1C 5.8 07/31/2022   HGBA1C 5.7 07/26/2021   HGBA1C 5.4 07/20/2020     How often do you need to have someone help you when you read instructions, pamphlets, or other written materials from your doctor or pharmacy?: 1 - Never  Interpreter Needed?: No  Information entered by :: Lamont Pilsner, LPN   Activities of Daily Living     02/12/2024    3:24 PM 10/20/2023    3:17 PM  In your present state of health, do you have any difficulty performing the following activities:   Hearing? 0   Vision? 0   Difficulty concentrating or making decisions? 0   Walking or climbing stairs? 0   Dressing or bathing? 0   Doing errands, shopping? 0 0  Preparing Food and eating ? N   Using the Toilet? N   In the past six months, have you accidently leaked urine? N   Do you have problems with loss of bowel control? N   Managing your Medications? N   Managing your Finances? N   Housekeeping or managing your Housekeeping? N     Patient Care Team: Rodney Clamp, MD as PCP - General (Family Medicine) Shirlee Dotter, MD (Inactive) as Consulting Physician (Orthopedic Surgery)  Indicate any recent Medical Services you may have received from other than [facility] providers in the past year (date may be approximate).     Assessment:   This is a routine wellness examination for Tina Aguilar.  Hearing/Vision screen Hearing Screening - Comments:: Pt denies any hearing issues  Vision Screening - Comments:: Wears rx glasses - up to date with routine eye exams with [facility]     Goals Addressed             This Visit's Progress    Patient Stated       Lose weight        Depression Screen     02/12/2024    3:26 PM 06/24/2023   10:22 AM 02/06/2023    4:22 PM 07/31/2022    8:02 AM 11/06/2021    8:04 AM 07/26/2021    9:31 AM 07/06/2021   10:23 AM  PHQ 2/9 Scores  PHQ - 2 Score 0 2 0 0 0 0 0  PHQ- 9 Score  7         Fall Risk     02/12/2024    3:29 PM 06/24/2023   10:18 AM 02/14/2023   10:18 AM  02/06/2023    4:25 PM 07/31/2022    8:02 AM  Fall Risk   Falls in the past year? 0 0 0 0 0  Number falls in past yr: 0 0 0 0 0  Injury with Fall? 0 1 0 0 0  Risk for fall due to : No Fall Risks No Fall Risks No Fall Risks Impaired vision;Impaired balance/gait;Impaired mobility No Fall Risks  Follow up Falls prevention discussed   Falls prevention discussed     MEDICARE RISK AT HOME:  Medicare Risk at Home Any stairs in or around the home?: No If so, are there any without  handrails?: No Home free of loose throw rugs in walkways, pet beds, electrical cords, etc?: Yes Adequate lighting in your home to reduce risk of falls?: Yes Life alert?: No Use of a cane, walker or w/c?: No Grab bars in the bathroom?: No Shower chair or bench in shower?: No Elevated toilet seat or a handicapped toilet?: No  TIMED UP AND GO:  Was the test performed?  No  Cognitive Function: 6CIT completed        02/12/2024    3:29 PM 02/06/2023    4:26 PM 11/06/2021    8:11 AM  6CIT Screen  What Year? 0 points 0 points 0 points  What month? 0 points 0 points 0 points  What time? 0 points 0 points 0 points  Count back from 20 0 points 0 points 0 points  Months in reverse 0 points 0 points 0 points  Repeat phrase 0 points 0 points 0 points  Total Score 0 points 0 points 0 points    Immunizations Immunization History  Administered Date(s) Administered   PFIZER(Purple Top)SARS-COV-2 Vaccination 12/27/2019, 01/19/2020, 08/13/2020, 04/30/2021   PPD Test 04/02/2022   Pneumococcal Conjugate-13 07/20/2020   Pneumococcal Polysaccharide-23 07/26/2021   Tdap 05/25/2014   Zoster Recombinant(Shingrix) 05/27/2017, 11/13/2017    Screening Tests Health Maintenance  Topic Date Due   COVID-19 Vaccine (5 - 2024-25 season) 06/15/2023   DTaP/Tdap/Td (2 - Td or Tdap) 05/25/2024   Medicare Annual Wellness (AWV)  02/11/2025   MAMMOGRAM  07/17/2025   Colonoscopy  06/28/2031   Pneumonia Vaccine 47+ Years old  Completed   DEXA SCAN  Completed   Hepatitis C Screening  Completed   Zoster Vaccines- Shingrix  Completed   HPV VACCINES  Aged Out   Meningococcal B Vaccine  Aged Out   INFLUENZA VACCINE  Discontinued    Health Maintenance  Health Maintenance Due  Topic Date Due   COVID-19 Vaccine (5 - 2024-25 season) 06/15/2023   Health Maintenance Items Addressed: See Nurse Notes  Additional Screening:  Vision Screening: Recommended annual ophthalmology exams for early detection of  glaucoma and other disorders of the eye.  Dental Screening: Recommended annual dental exams for proper oral hygiene  Community Resource Referral / Chronic Care Management: CRR required this visit?  No   CCM required this visit?  No     Plan:     I have personally reviewed and noted the following in the patient's chart:   Medical and social history Use of alcohol, tobacco or illicit drugs  Current medications and supplements including opioid prescriptions. Patient is not currently taking opioid prescriptions. Functional ability and status Nutritional status Physical activity Advanced directives List of other physicians Hospitalizations, surgeries, and ER visits in previous 12 months Vitals Screenings to include cognitive, depression, and falls Referrals and appointments  In addition, I have reviewed and discussed with patient certain  preventive protocols, quality metrics, and best practice recommendations. A written personalized care plan for preventive services as well as general preventive health recommendations were provided to patient.     Bruno Capri, LPN   01/18/8294   After Visit Summary: (MyChart) Due to this being a telephonic visit, the after visit summary with patients personalized plan was offered to patient via MyChart   Notes: Nothing significant to report at this time.

## 2024-03-11 ENCOUNTER — Other Ambulatory Visit (INDEPENDENT_AMBULATORY_CARE_PROVIDER_SITE_OTHER): Payer: Self-pay

## 2024-03-11 ENCOUNTER — Encounter: Payer: Self-pay | Admitting: Orthopaedic Surgery

## 2024-03-11 ENCOUNTER — Ambulatory Visit: Payer: Medicare PPO | Admitting: Orthopaedic Surgery

## 2024-03-11 DIAGNOSIS — M1612 Unilateral primary osteoarthritis, left hip: Secondary | ICD-10-CM | POA: Diagnosis not present

## 2024-03-11 DIAGNOSIS — M25552 Pain in left hip: Secondary | ICD-10-CM | POA: Diagnosis not present

## 2024-03-11 DIAGNOSIS — Z96641 Presence of right artificial hip joint: Secondary | ICD-10-CM

## 2024-03-11 NOTE — Progress Notes (Signed)
 The patient is now 4 months status post a right total hip arthroplasty to treat significant right hip osteoarthritis.  She is 68 years old and very active.  She is very pleased with the right hip and its outcome thus far.  She still having some soreness and understands that is to be expected.  She does have known arthritis in her left hip and she is considering left hip replacement maybe in January of next year which will be a year out from her right hip replacement.  On exam her right operative hip moves smoothly and fluid no blocks or rotation.  The left hip has significant stiffness with internal and external rotation as well as pain in the groin with rotation.  An AP pelvis shows a well-seated right total hip replacement.  The left hip has significant arthritis with joint space narrowing and large osteophytes around the left hip.  This point we will see her back in 6 months with a repeat standing AP pelvis.  We will also have a lateral both hips at that visit.  We can then work on talk about scheduling her for her left hip.  If things worsen before then she knows to let us  know or if there is issues with her other hip.

## 2024-06-17 ENCOUNTER — Other Ambulatory Visit: Payer: Self-pay | Admitting: Family Medicine

## 2024-06-17 DIAGNOSIS — Z1231 Encounter for screening mammogram for malignant neoplasm of breast: Secondary | ICD-10-CM

## 2024-07-22 ENCOUNTER — Ambulatory Visit

## 2024-07-27 ENCOUNTER — Ambulatory Visit
Admission: RE | Admit: 2024-07-27 | Discharge: 2024-07-27 | Disposition: A | Discharge status: Home / Self Care | Source: Ambulatory Visit | Attending: Family Medicine | Admitting: Family Medicine

## 2024-07-27 DIAGNOSIS — Z1231 Encounter for screening mammogram for malignant neoplasm of breast: Secondary | ICD-10-CM

## 2024-08-16 ENCOUNTER — Encounter: Payer: Self-pay | Admitting: Radiology

## 2024-08-19 ENCOUNTER — Telehealth: Payer: Self-pay

## 2024-08-19 NOTE — Telephone Encounter (Signed)
 Received voice mail from patient asking to schedule THA for February 2026.  I called patient back and left voice mail message for return call.

## 2024-09-02 ENCOUNTER — Ambulatory Visit: Admitting: Orthopaedic Surgery

## 2024-09-02 ENCOUNTER — Encounter: Payer: Self-pay | Admitting: Orthopaedic Surgery

## 2024-09-02 ENCOUNTER — Other Ambulatory Visit: Payer: Self-pay

## 2024-09-02 DIAGNOSIS — Z96641 Presence of right artificial hip joint: Secondary | ICD-10-CM

## 2024-09-02 NOTE — Progress Notes (Signed)
 The patient is here today at 87-month status post a right total hip arthroplasty.  She says she is pain-free with the right hip.  She is an active 68 year old female.  She still reports some stiffness but the motion is better than her left hip that has known arthritis in that hip.  That has been well-documented.  She is actually scheduled for a left total hip arthroplasty coming up in February 2026.  On exam today the left hip has significant stiffness with internal and external rotation with significant pain in the groin.  I cannot fully rotate her left hip.  It is stiff and painful.  The right hip which is the replaced hip moves smoothly and fluidly.  She does report an improvement in her ADLs on the right side.  She is having a harder time with her left hip in terms of stiffness and pain and difficulty with ADLs.  At this point her left hip pain is daily and it is detrimentally affecting her mobility, quality of life and actives daily living enough to proceed with hip replacement surgery.  Of note she does walk with a Trendelenburg gait due to her left hip and there is a leg length difference with her left side shorter than the right.  An AP pelvis today shows a well-seated right total hip arthroplasty with no complicating features.  It is going in the bone nicely.  The left hip has significant and worsening osteoarthritis when compared to previous films.  There are osteophytes all around the left hip with significant joint space narrowing and superior laterally flattening of the femoral head that is worsened when compared to films from over the last year.  At this point I agree with her proceeding with left total hip arthroplasty which is are scheduled in February.  All questions concerns were answered addressed.  There is been no acute or interval change in her medical status.  All questions and concerns were answered and addressed.

## 2024-09-13 ENCOUNTER — Telehealth: Payer: Self-pay | Admitting: Orthopaedic Surgery

## 2024-09-13 ENCOUNTER — Other Ambulatory Visit: Payer: Self-pay

## 2024-09-13 DIAGNOSIS — M25552 Pain in left hip: Secondary | ICD-10-CM

## 2024-09-13 NOTE — Telephone Encounter (Signed)
 Patient called and ask if its ok for her to get the cortisone shot before surgery. CB#[phone]

## 2024-09-14 DIAGNOSIS — M9904 Segmental and somatic dysfunction of sacral region: Secondary | ICD-10-CM | POA: Diagnosis not present

## 2024-09-14 DIAGNOSIS — S338XXA Sprain of other parts of lumbar spine and pelvis, initial encounter: Secondary | ICD-10-CM | POA: Diagnosis not present

## 2024-09-14 DIAGNOSIS — M9903 Segmental and somatic dysfunction of lumbar region: Secondary | ICD-10-CM | POA: Diagnosis not present

## 2024-09-14 DIAGNOSIS — M9901 Segmental and somatic dysfunction of cervical region: Secondary | ICD-10-CM | POA: Diagnosis not present

## 2024-09-14 DIAGNOSIS — S29012A Strain of muscle and tendon of back wall of thorax, initial encounter: Secondary | ICD-10-CM | POA: Diagnosis not present

## 2024-09-14 DIAGNOSIS — S39012A Strain of muscle, fascia and tendon of lower back, initial encounter: Secondary | ICD-10-CM | POA: Diagnosis not present

## 2024-09-14 DIAGNOSIS — M9902 Segmental and somatic dysfunction of thoracic region: Secondary | ICD-10-CM | POA: Diagnosis not present

## 2024-09-14 DIAGNOSIS — S161XXA Strain of muscle, fascia and tendon at neck level, initial encounter: Secondary | ICD-10-CM | POA: Diagnosis not present

## 2024-09-17 DIAGNOSIS — M9901 Segmental and somatic dysfunction of cervical region: Secondary | ICD-10-CM | POA: Diagnosis not present

## 2024-09-17 DIAGNOSIS — S39012A Strain of muscle, fascia and tendon of lower back, initial encounter: Secondary | ICD-10-CM | POA: Diagnosis not present

## 2024-09-17 DIAGNOSIS — M9902 Segmental and somatic dysfunction of thoracic region: Secondary | ICD-10-CM | POA: Diagnosis not present

## 2024-09-17 DIAGNOSIS — M9904 Segmental and somatic dysfunction of sacral region: Secondary | ICD-10-CM | POA: Diagnosis not present

## 2024-09-17 DIAGNOSIS — S161XXA Strain of muscle, fascia and tendon at neck level, initial encounter: Secondary | ICD-10-CM | POA: Diagnosis not present

## 2024-09-17 DIAGNOSIS — M9903 Segmental and somatic dysfunction of lumbar region: Secondary | ICD-10-CM | POA: Diagnosis not present

## 2024-09-17 DIAGNOSIS — S338XXA Sprain of other parts of lumbar spine and pelvis, initial encounter: Secondary | ICD-10-CM | POA: Diagnosis not present

## 2024-09-17 DIAGNOSIS — S29012A Strain of muscle and tendon of back wall of thorax, initial encounter: Secondary | ICD-10-CM | POA: Diagnosis not present

## 2024-09-20 ENCOUNTER — Other Ambulatory Visit: Payer: Self-pay

## 2024-09-20 ENCOUNTER — Ambulatory Visit: Admitting: Sports Medicine

## 2024-09-20 ENCOUNTER — Encounter: Payer: Self-pay | Admitting: Sports Medicine

## 2024-09-20 DIAGNOSIS — M25552 Pain in left hip: Secondary | ICD-10-CM | POA: Diagnosis not present

## 2024-09-20 DIAGNOSIS — M1612 Unilateral primary osteoarthritis, left hip: Secondary | ICD-10-CM

## 2024-09-20 MED ORDER — LIDOCAINE HCL 1 % IJ SOLN
4.0000 mL | INTRAMUSCULAR | Status: AC | PRN
  Administered 2024-09-20: 4 mL

## 2024-09-20 MED ORDER — METHYLPREDNISOLONE ACETATE 40 MG/ML IJ SUSP
80.0000 mg | INTRAMUSCULAR | Status: AC | PRN
  Administered 2024-09-20: 80 mg via INTRA_ARTICULAR

## 2024-09-20 NOTE — Progress Notes (Signed)
 Tina Aguilar - 68 y.o. female MRN [id number]  Date of birth: Mar 17, 1956  Office Visit Note: Visit Date: 09/20/2024 PCP: Kennyth Worth HERO, MD Referred by: Kennyth Worth HERO, MD  Subjective: Chief Complaint  Patient presents with   Left Hip - Pain   HPI: Tina Aguilar is a pleasant 68 y.o. female who presents today for acute on chronic left hip pain with osteoarthritis.  She is almost 1 year out from her right total hip arthroplasty with Dr. Vernetta.  More recently the left hip has become painful and stiff -this is interfering with ADLs. She is scheduled for a left total hip arthroplasty coming up in February 2026 but is looking for some pain relief until that time.  She is using naproxen/Aleve as needed.  Lab Results  Component Value Date   HGBA1C 5.8 07/31/2022   Pertinent ROS were reviewed with the patient and found to be negative unless otherwise specified above in HPI.   Assessment & Plan: Visit Diagnoses:  1. Unilateral primary osteoarthritis, left hip   2. Pain in left hip    Plan: Impression is acute exacerbation of chronic left hip pain with advanced osteoarthritic change.  She is in the setting of right hip total arthroplasty almost 1 year ago and doing well from that standpoint.  She is scheduled for left hip replacement on 11/24/2023, she is looking for some relief in the short-term to get her through until this time.  Through shared decision making, we did proceed with ultrasound-guided left hip intra-articular injection.  Patient tolerated well, advised on postinjection protocol.  She may use ice/heat as well as her naproxen in the short-term for pain control.  She will follow-up with Dr. Vernetta for her preoperative appointment shortly for hip replacement.  She may return or notify me if any questions/concerns if needed.  Follow-up: Return for f/u with Dr. Vernetta.   Meds & Orders: No orders of the defined types were placed in this encounter.    Orders Placed This Encounter  Procedures   Large Joint Inj   US  Guided Needle Placement - No Linked Charges     Procedures: Large Joint Inj: L hip joint on 09/20/2024 2:21 PM Indications: pain Details: 22 G 3.5 in needle, ultrasound-guided anterior approach Medications: 4 mL lidocaine  1 %; 80 mg methylPREDNISolone  acetate 40 MG/ML Outcome: tolerated well, no immediate complications  Procedure: US -guided intra-articular hip injection, Left After discussion on risks/benefits/indications and informed verbal consent was obtained, a timeout was performed. Patient was lying supine on exam table. The hip was cleaned with betadine  and alcohol swabs . Then utilizing ultrasound guidance, the patient's femoral head and neck junction was identified and subsequently injected with 4:2 lidocaine :depomedrol via an in-plane approach with ultrasound visualization of the injectate administered into the hip joint. Patient tolerated procedure well without immediate complications.  Procedure, treatment alternatives, risks and benefits explained, specific risks discussed. Consent was given by the patient. Immediately prior to procedure a time out was called to verify the correct patient, procedure, equipment, support staff and site/side marked as required. Patient was prepped and draped in the usual sterile fashion.          Clinical History:   She reports that she quit smoking about 47 years ago. Her smoking use included cigarettes. She started smoking about 52 years ago. She has a 5 pack-year smoking history. She has never used smokeless tobacco. No results for input(s): HGBA1C, LABURIC in the last 8760 hours.  Objective:  Physical Exam  Gen: Well-appearing, in no acute distress; non-toxic CV: Well-perfused. Warm.  Resp: Breathing unlabored on room air; no wheezing. Psych: Fluid speech in conversation; appropriate affect; normal thought process  Ortho Exam - Left hip: No redness swelling or  effusion.  There is mild discomfort and restriction with endrange internal and external logroll.  Imaging:  *I did review her hip/pelvic x-rays during today's visit.  09/02/24: An AP pelvis shows a well-seated right total hip arthroplasty with no  complicating features.  The left hip joint space is significantly narrowed  and significantly arthritic when compared to films from 2 years ago.   There is significant joint space narrowing of the superior lateral aspect  with some slight flattening of the femoral head and large osteophytes  around the left hip.   Past Medical/Family/Surgical/Social History: Medications & Allergies reviewed per EMR, new medications updated. Patient Active Problem List   Diagnosis Date Noted   Status post total replacement of right hip 10/28/2023   Unilateral primary osteoarthritis, left hip 07/09/2023   Stress 07/31/2022   Binge eating disorder 05/27/2017   Headache 05/27/2017   Colon polyps 08/30/2010   Hyperlipidemia 06/30/2007   Past Medical History:  Diagnosis Date   Arthritis    hips, hands   Chronic kidney disease    hx of kidney stones   Depression    Frequency of urination    History of adenomatous polyp of colon 08/28/2005   Hyperlipidemia, mixed    on meds   Left ureteral stone    Osteoarthritis    bilateral hips/lower back   Spinal stenosis    Vertigo    Wears glasses    Family History  Problem Relation Age of Onset   Coronary artery disease Mother    Arthritis Mother    Congestive Heart Failure Father    Heart attack Father    Hyperlipidemia Sister    Diabetes Sister    Heart disease Sister    Hyperlipidemia Sister    Hyperlipidemia Sister    Other Brother        coronary artery bypass graft age 68   Hyperlipidemia Brother    Diabetes Brother    Heart attack Maternal Aunt        at age 41   Breast cancer Neg Hx    Colon polyps Neg Hx    Colon cancer Neg Hx    Esophageal cancer Neg Hx    Rectal cancer Neg Hx     Stomach cancer Neg Hx    Past Surgical History:  Procedure Laterality Date   CESAREAN SECTION  1991   and 1993   COLONOSCOPY  2012   JP-F/V-movi(exc)-normal   CYSTOSCOPY/URETEROSCOPY/HOLMIUM LASER/STENT PLACEMENT Left 05/06/2017   Procedure: CYSTOSCOPY/URETEROSCOPY/HOLMIUM LASER/STENT PLACEMENT;  Surgeon: Chauncey Redell Agent, MD;  Location: [facility];  Service: Urology;  Laterality: Left;   DILATION AND CURETTAGE OF UTERUS  1980   w/ Suction for miscarriage  x2   TOTAL HIP ARTHROPLASTY Right 10/28/2023   Procedure: RIGHT TOTAL HIP ARTHROPLASTY ANTERIOR APPROACH;  Surgeon: Vernetta Lonni GRADE, MD;  Location: [facility] OR;  Service: Orthopedics;  Laterality: Right;   WISDOM TOOTH EXTRACTION  1976   Social History   Occupational History   Not on file  Tobacco Use   Smoking status: Former    Current packs/day: 0.00    Average packs/day: 1 pack/day for 5.0 years (5.0 ttl pk-yrs)    Types: Cigarettes    Start date: 04/30/1972  Quit date: 04/30/1977    Years since quitting: 47.4   Smokeless tobacco: Never  Vaping Use   Vaping status: Never Used  Substance and Sexual Activity   Alcohol use: No    Alcohol/week: 0.0 standard drinks of alcohol   Drug use: No   Sexual activity: Not on file

## 2024-10-13 ENCOUNTER — Telehealth: Payer: Self-pay

## 2024-10-13 NOTE — Telephone Encounter (Signed)
 Was planning left THA on 11/23/24. Patient called to say her husband passed away and she is moving to [place], [place]. Even though she really loves Dr. Vernetta, she will need someone in that area to do the surgery. She is asking for a recommendation from Dr. Vernetta of someone that would do as good of a job as him.

## 2024-10-15 ENCOUNTER — Encounter: Admitting: Family Medicine

## 2024-10-20 NOTE — Telephone Encounter (Signed)
I called patient and left voice mail for return call. °

## 2024-11-01 ENCOUNTER — Encounter: Admitting: Family Medicine

## 2024-11-03 ENCOUNTER — Encounter: Payer: Self-pay | Admitting: Family Medicine

## 2024-11-03 ENCOUNTER — Ambulatory Visit: Admitting: Family Medicine

## 2024-11-03 VITALS — BP 124/70 | HR 75 | Temp 97.3°F | Ht 63.5 in | Wt 169.0 lb

## 2024-11-03 DIAGNOSIS — F50819 Binge eating disorder, unspecified: Secondary | ICD-10-CM

## 2024-11-03 DIAGNOSIS — Z0001 Encounter for general adult medical examination with abnormal findings: Secondary | ICD-10-CM | POA: Diagnosis not present

## 2024-11-03 DIAGNOSIS — E7800 Pure hypercholesterolemia, unspecified: Secondary | ICD-10-CM

## 2024-11-03 DIAGNOSIS — F439 Reaction to severe stress, unspecified: Secondary | ICD-10-CM

## 2024-11-03 DIAGNOSIS — R739 Hyperglycemia, unspecified: Secondary | ICD-10-CM

## 2024-11-03 LAB — LIPID PANEL
Cholesterol: 196 mg/dL (ref 28–200)
HDL: 81.4 mg/dL
LDL Cholesterol: 100 mg/dL — ABNORMAL HIGH (ref 10–99)
NonHDL: 114.75
Total CHOL/HDL Ratio: 2
Triglycerides: 74 mg/dL (ref 10.0–149.0)
VLDL: 14.8 mg/dL (ref 0.0–40.0)

## 2024-11-03 LAB — CBC
HCT: 42.7 % (ref 36.0–46.0)
Hemoglobin: 14.4 g/dL (ref 12.0–15.0)
MCHC: 33.9 g/dL (ref 30.0–36.0)
MCV: 89.1 fl (ref 78.0–100.0)
Platelets: 314 K/uL (ref 150.0–400.0)
RBC: 4.79 Mil/uL (ref 3.87–5.11)
RDW: 14.2 % (ref 11.5–15.5)
WBC: 6.4 K/uL (ref 4.0–10.5)

## 2024-11-03 LAB — URINALYSIS, ROUTINE W REFLEX MICROSCOPIC
Bilirubin Urine: NEGATIVE
Hgb urine dipstick: NEGATIVE
Leukocytes,Ua: NEGATIVE
Nitrite: NEGATIVE
RBC / HPF: NONE SEEN
Specific Gravity, Urine: <=1.005 — AB (ref 1.000–1.030)
Total Protein, Urine: NEGATIVE
Urine Glucose: NEGATIVE
Urobilinogen, UA: 0.2 (ref 0.0–1.0)
WBC, UA: NONE SEEN
pH: 6 (ref 5.0–8.0)

## 2024-11-03 LAB — TSH: TSH: 1.55 u[IU]/mL (ref 0.35–5.50)

## 2024-11-03 LAB — COMPREHENSIVE METABOLIC PANEL WITH GFR
ALT: 40 U/L — ABNORMAL HIGH (ref 3–35)
AST: 26 U/L (ref 5–37)
Albumin: 4.4 g/dL (ref 3.5–5.2)
Alkaline Phosphatase: 86 U/L (ref 39–117)
BUN: 14 mg/dL (ref 6–23)
CO2: 29 meq/L (ref 19–32)
Calcium: 10 mg/dL (ref 8.4–10.5)
Chloride: 103 meq/L (ref 96–112)
Creatinine, Ser: 0.61 mg/dL (ref 0.40–1.20)
GFR: 91.15 mL/min
Glucose, Bld: 87 mg/dL (ref 70–99)
Potassium: 4.5 meq/L (ref 3.5–5.1)
Sodium: 139 meq/L (ref 135–145)
Total Bilirubin: 0.6 mg/dL (ref 0.2–1.2)
Total Protein: 7.3 g/dL (ref 6.0–8.3)

## 2024-11-03 LAB — HEMOGLOBIN A1C: Hgb A1c MFr Bld: 5.7 % (ref 4.6–6.5)

## 2024-11-03 MED ORDER — EZETIMIBE-SIMVASTATIN 10-10 MG PO TABS: 1.0000 | ORAL_TABLET | Freq: Every day | ORAL | 4 refills | Status: AC

## 2024-11-03 NOTE — Assessment & Plan Note (Signed)
 Check lipids.  Discussed lifestyle modifications.  She is on Vytorin  10-10 daily.

## 2024-11-03 NOTE — Assessment & Plan Note (Signed)
 Her husband passed away a few weeks ago.  She has good support at home and feels like she is managing as well as can be expected.

## 2024-11-03 NOTE — Addendum Note (Signed)
 Addended by: IDA ELORA HERO on: 11/03/2024 09:50 AM   Modules accepted: Orders

## 2024-11-03 NOTE — Patient Instructions (Signed)
 It was very nice to see you today!  VISIT SUMMARY: During your annual physical exam, we discussed your recent transition period, concerns about cancer, diet and exercise habits, vision issues, and medication supply. Your physical examination showed no acute concerns, and we have ordered comprehensive blood work to evaluate your overall health.  YOUR PLAN: PURE HYPERCHOLESTEROLEMIA: Your cholesterol levels need to be evaluated. -We have ordered a cholesterol level test as part of your comprehensive blood work.  DIET AND WEIGHT MANAGEMENT: Your diet and exercise habits have been suboptimal, and you are interested in meal delivery services for healthier eating. -Consider exploring meal delivery services for structured meal planning.  AGE-RELATED CATARACT: You have an early cataract noted in a previous eye examination. -Follow up with an eye doctor after your relocation to [facility].  DRY EYE SYNDROME: You have reported dry eyes and a sensation of a foreign body in your eye, likely due to dryness. -Use over-the-counter lubricating eye drops to relieve the dryness.  Return in about 1 year (around 11/03/2025) for Annual Physical.   Take care, Dr Kennyth  PLEASE NOTE:  If you had any lab tests, please let us  know if you have not heard back within a few days. You may see your results on mychart before we have a chance to review them but we will give you a call once they are reviewed by us .   If we ordered any referrals today, please let us  know if you have not heard from their office within the next week.   If you had any urgent prescriptions sent in today, please check with the pharmacy within an hour of our visit to make sure the prescription was transmitted appropriately.   Please try these tips to maintain a healthy lifestyle:  Eat at least 3 REAL meals and 1-2 snacks per day.  Aim for no more than 5 hours between eating.  If you eat breakfast, please do so within one hour of getting up.    Each meal should contain half fruits/vegetables, one quarter protein, and one quarter carbs (no bigger than a computer mouse)  Cut down on sweet beverages. This includes juice, soda, and sweet tea.   Drink at least 1 glass of water with each meal and aim for at least 8 glasses per day  Exercise at least 150 minutes every week.    Preventive Care 29 Years and Older, Female Preventive care refers to lifestyle choices and visits with your health care provider that can promote health and wellness. Preventive care visits are also called wellness exams. What can I expect for my preventive care visit? Counseling Your health care provider may ask you questions about your: Medical history, including: Past medical problems. Family medical history. Pregnancy and menstrual history. History of falls. Current health, including: Memory and ability to understand (cognition). Emotional well-being. Home life and relationship well-being. Sexual activity and sexual health. Lifestyle, including: Alcohol, nicotine or tobacco, and drug use. Access to firearms. Diet, exercise, and sleep habits. Work and work astronomer. Sunscreen use. Safety issues such as seatbelt and bike helmet use. Physical exam Your health care provider will check your: Height and weight. These may be used to calculate your BMI (body mass index). BMI is a measurement that tells if you are at a healthy weight. Waist circumference. This measures the distance around your waistline. This measurement also tells if you are at a healthy weight and may help predict your risk of certain diseases, such as type 2 diabetes and  high blood pressure. Heart rate and blood pressure. Body temperature. Skin for abnormal spots. What immunizations do I need?  Vaccines are usually given at various ages, according to a schedule. Your health care provider will recommend vaccines for you based on your age, medical history, and lifestyle or other  factors, such as travel or where you work. What tests do I need? Screening Your health care provider may recommend screening tests for certain conditions. This may include: Lipid and cholesterol levels. Hepatitis C test. Hepatitis B test. HIV (human immunodeficiency virus) test. STI (sexually transmitted infection) testing, if you are at risk. Lung cancer screening. Colorectal cancer screening. Diabetes screening. This is done by checking your blood sugar (glucose) after you have not eaten for a while (fasting). Mammogram. Talk with your health care provider about how often you should have regular mammograms. BRCA-related cancer screening. This may be done if you have a family history of breast, ovarian, tubal, or peritoneal cancers. Bone density scan. This is done to screen for osteoporosis. Talk with your health care provider about your test results, treatment options, and if necessary, the need for more tests. Follow these instructions at home: Eating and drinking  Eat a diet that includes fresh fruits and vegetables, whole grains, lean protein, and low-fat dairy products. Limit your intake of foods with high amounts of sugar, saturated fats, and salt. Take vitamin and mineral supplements as recommended by your health care provider. Do not drink alcohol if your health care provider tells you not to drink. If you drink alcohol: Limit how much you have to 0-1 drink a day. Know how much alcohol is in your drink. In the [place] one drink equals one 12 oz bottle of beer (355 mL), one 5 oz glass of wine (148 mL), or one 1 oz glass of hard liquor (44 mL). Lifestyle Brush your teeth every morning and night with fluoride toothpaste. Floss one time each day. Exercise for at least 30 minutes 5 or more days each week. Do not use any products that contain nicotine or tobacco. These products include cigarettes, chewing tobacco, and vaping devices, such as e-cigarettes. If you need help quitting, ask  your health care provider. Do not use drugs. If you are sexually active, practice safe sex. Use a condom or other form of protection in order to prevent STIs. Take aspirin  only as told by your health care provider. Make sure that you understand how much to take and what form to take. Work with your health care provider to find out whether it is safe and beneficial for you to take aspirin  daily. Ask your health care provider if you need to take a cholesterol-lowering medicine (statin). Find healthy ways to manage stress, such as: Meditation, yoga, or listening to music. Journaling. Talking to a trusted person. Spending time with friends and family. Minimize exposure to UV radiation to reduce your risk of skin cancer. Safety Always wear your seat belt while driving or riding in a vehicle. Do not drive: If you have been drinking alcohol. Do not ride with someone who has been drinking. When you are tired or distracted. While texting. If you have been using any mind-altering substances or drugs. Wear a helmet and other protective equipment during sports activities. If you have firearms in your house, make sure you follow all gun safety procedures. What's next? Visit your health care provider once a year for an annual wellness visit. Ask your health care provider how often you should have your eyes and  teeth checked. Stay up to date on all vaccines. This information is not intended to replace advice given to you by your health care provider. Make sure you discuss any questions you have with your health care provider. Document Revised: 03/28/2021 Document Reviewed: 03/28/2021 Elsevier Patient Education  2024 [employer].

## 2024-11-03 NOTE — Progress Notes (Signed)
 "  Chief Complaint:  Tina Aguilar is a 69 y.o. female who presents today for her annual comprehensive physical exam.    Assessment/Plan:  New/Acute Problems: Dry Eye  No apparent abnormalities on exam.  Recommended over-the-counter lubricating drops.  She can follow-up with her eye doctor if not improving.   Chronic Problems Addressed Today: Hyperlipidemia Check lipids.  Discussed lifestyle modifications.  She is on Vytorin  10-10 daily.  Stress Her husband passed away a few weeks ago.  She has good support at home and feels like she is managing as well as can be expected.   Preventative Healthcare: Check labs.  Up-to-date on mammogram and colonoscopy.  Due for bone density scan though she can schedule this when she establishes with her new PCP in [place]  Patient Counseling(The following topics were reviewed and/or handout was given):  -Nutrition: Stressed importance of moderation in sodium/caffeine intake, saturated fat and cholesterol, caloric balance, sufficient intake of fresh fruits, vegetables, and fiber.  -Stressed the importance of regular exercise.   -Substance Abuse: Discussed cessation/primary prevention of tobacco, alcohol, or other drug use; driving or other dangerous activities under the influence; availability of treatment for abuse.   -Injury prevention: Discussed safety belts, safety helmets, smoke detector, smoking near bedding or upholstery.   -Sexuality: Discussed sexually transmitted diseases, partner selection, use of condoms, avoidance of unintended pregnancy and contraceptive alternatives.   -Dental health: Discussed importance of regular tooth brushing, flossing, and dental visits.  -Health maintenance and immunizations reviewed. Please refer to Health maintenance section.  Return to care in 1 year for next preventative visit.     Subjective:  HPI:  She has no acute complaints today. Patient is here today for her annual physical.  See assessment  / plan for status of chronic conditions.  Discussed the use of AI scribe software for clinical note transcription with the patient, who gave verbal consent to proceed.  History of Present Illness Tina Aguilar is a 69 year old female who presents for an annual physical exam.  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 She is moving to [place], [place] , to be closer to her family.  She is concerned about cancer despite recent negative results from several x-rays and a mammogram. She reports that her recent cancer screenings, including mammogram and colonoscopy, have been negative. She is looking forward to blood work to check for any issues, including cancer.  Her diet and exercise habits have been poor over the past year due to caregiving responsibilities and working two part-time jobs. She feels out of structure and schedule, unsure of what she is eating, and has not been exercising as she used to. She has tried crash diets without success and wants a more natural approach to weight management. She has not seen a nutritionist but is considering meal services for healthier eating.  Her vision is worsening, and she plans to see an eye doctor after her move. She was told she has the beginnings of a cataract. She also reports a sensation of a 'little stone' in her eye, which started a couple of months ago, and suspects dry eyes.  She reports that she is nearing the end of her current medication supply and will be seeing a new doctor in [place] in three months.      02/12/2024    3:26 PM  Depression screen PHQ 2/9  Decreased Interest 0  Down, Depressed,  Hopeless 0  PHQ - 2 Score 0    There are no preventive care reminders to display for this patient.   ROS: Per HPI, otherwise a complete review of systems was negative.   PMH:  The following were reviewed and entered/updated in epic: Past Medical  History:  Diagnosis Date   Arthritis    hips, hands   Chronic kidney disease    hx of kidney stones   Depression    Frequency of urination    History of adenomatous polyp of colon 08/28/2005   Hyperlipidemia, mixed    on meds   Left ureteral stone    Osteoarthritis    bilateral hips/lower back   Spinal stenosis    Vertigo    Wears glasses    Patient Active Problem List   Diagnosis Date Noted   Status post total replacement of right hip 10/28/2023   Unilateral primary osteoarthritis, left hip 07/09/2023   Stress 07/31/2022   Binge eating disorder 05/27/2017   Headache 05/27/2017   Colon polyps 08/30/2010   Hyperlipidemia 06/30/2007   Past Surgical History:  Procedure Laterality Date   CESAREAN SECTION  1991   and 1993   COLONOSCOPY  2012   JP-F/V-movi(exc)-normal   CYSTOSCOPY/URETEROSCOPY/HOLMIUM LASER/STENT PLACEMENT Left 05/06/2017   Procedure: CYSTOSCOPY/URETEROSCOPY/HOLMIUM LASER/STENT PLACEMENT;  Surgeon: Chauncey Redell Agent, MD;  Location: [facility];  Service: Urology;  Laterality: Left;   DILATION AND CURETTAGE OF UTERUS  1980   w/ Suction for miscarriage  x2   TOTAL HIP ARTHROPLASTY Right 10/28/2023   Procedure: RIGHT TOTAL HIP ARTHROPLASTY ANTERIOR APPROACH;  Surgeon: Vernetta Lonni GRADE, MD;  Location: [facility] OR;  Service: Orthopedics;  Laterality: Right;   WISDOM TOOTH EXTRACTION  1976    Family History  Problem Relation Age of Onset   Coronary artery disease Mother    Arthritis Mother    Congestive Heart Failure Father    Heart attack Father    Hyperlipidemia Sister    Diabetes Sister    Heart disease Sister    Hyperlipidemia Sister    Hyperlipidemia Sister    Other Brother        coronary artery bypass graft age 44   Hyperlipidemia Brother    Diabetes Brother    Heart attack Maternal Aunt        at age 73   Breast cancer Neg Hx    Colon polyps Neg Hx    Colon cancer Neg Hx    Esophageal cancer Neg Hx    Rectal cancer Neg Hx     Stomach cancer Neg Hx     Medications- reviewed and updated Current Outpatient Medications  Medication Sig Dispense Refill   naproxen sodium (ALEVE) 220 MG tablet Take 220 mg by mouth at bedtime as needed (pain).     ezetimibe -simvastatin  (VYTORIN ) 10-10 MG tablet Take 1 tablet by mouth at bedtime. 90 tablet 4   No current facility-administered medications for this visit.    Allergies-reviewed and updated Allergies[1]  Social History   Socioeconomic History   Marital status: Married    Spouse name: Not on file   Number of children: 2   Years of education: Not on file   Highest education level: Not on file  Occupational History   Not on file  Tobacco Use   Smoking status: Former    Current packs/day: 0.00    Average packs/day: 1 pack/day for 5.0 years (5.0 ttl pk-yrs)    Types: Cigarettes    Start date:  04/30/1972    Quit date: 04/30/1977    Years since quitting: 47.5   Smokeless tobacco: Never  Vaping Use   Vaping status: Never Used  Substance and Sexual Activity   Alcohol use: No    Alcohol/week: 0.0 standard drinks of alcohol   Drug use: No   Sexual activity: Not on file  Other Topics Concern   Not on file  Social History Narrative   [employer], [employer]   Married '84   Two grown daughters '91 '93   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   SO is in 70's and in good health, has RA   Social Drivers of Health   Tobacco Use: Medium Risk (11/03/2024)   Patient History    Smoking Tobacco Use: Former    Smokeless Tobacco Use: Never    Passive Exposure: Not on Actuary Strain: Low Risk (02/12/2024)   Overall Financial Resource Strain (CARDIA)    Difficulty of Paying Living Expenses: Not hard at all  Food Insecurity: No Food Insecurity (02/12/2024)   Hunger Vital Sign    Worried About Running Out of Food in the Last Year: Never true    Ran Out of Food in the Last Year: Never true  Transportation Needs: No Transportation Needs (02/12/2024)    PRAPARE - Administrator, Civil Service (Medical): No    Lack of Transportation (Non-Medical): No  Physical Activity: Sufficiently Active (02/12/2024)   Exercise Vital Sign    Days of Exercise per Week: 7 days    Minutes of Exercise per Session: 50 min  Stress: No Stress Concern Present (02/12/2024)   [employer] of Occupational Health - Occupational Stress Questionnaire    Feeling of Stress : Only a little  Social Connections: Moderately Integrated (02/12/2024)   Social Connection and Isolation Panel    Frequency of Communication with Friends and Family: More than three times a week    Frequency of Social Gatherings with Friends and Family: More than three times a week    Attends Religious Services: More than 4 times per year    Active Member of Clubs or Organizations: No    Attends Banker Meetings: Never    Marital Status: Married  Depression (PHQ2-9): Low Risk (02/12/2024)   Depression (PHQ2-9)    PHQ-2 Score: 0  Alcohol Screen: Low Risk (02/12/2024)   Alcohol Screen    Last Alcohol Screening Score (AUDIT): 0  Housing: Unknown (02/12/2024)   Housing Stability Vital Sign    Unable to Pay for Housing in the Last Year: No    Number of Times Moved in the Last Year: Not on file    Homeless in the Last Year: No  Utilities: Not At Risk (02/12/2024)   AHC Utilities    Threatened with loss of utilities: No  Health Literacy: Adequate Health Literacy (02/12/2024)   B1300 Health Literacy    Frequency of need for help with medical instructions: Never        Objective:  Physical Exam: BP 124/70   Pulse 75   Temp (!) 97.3 F (36.3 C) (Temporal)   Ht 5' 3.5 (1.613 m)   Wt 169 lb (76.7 kg)   SpO2 98%   BMI 29.47 kg/m   Body mass index is 29.47 kg/m. Wt Readings from Last 3 Encounters:  11/03/24 169 lb (76.7 kg)  02/12/24 168 lb (76.2 kg)  10/28/23 168 lb (76.2 kg)   Gen: NAD, resting comfortably HEENT: TMs normal  bilaterally. OP clear. No thyromegaly  noted.  CV: RRR with no murmurs appreciated Pulm: NWOB, CTAB with no crackles, wheezes, or rhonchi GI: Normal bowel sounds present. Soft, Nontender, Nondistended. MSK: no edema, cyanosis, or clubbing noted Skin: warm, dry Neuro: CN2-12 grossly intact. Strength 5/5 in upper and lower extremities. Reflexes symmetric and intact bilaterally.  Psych: Normal affect and thought content     Tarique Loveall M. Kennyth, MD 11/03/2024 9:24 AM     [1] No Known Allergies  "

## 2024-11-05 ENCOUNTER — Ambulatory Visit: Payer: Self-pay | Admitting: Family Medicine

## 2024-11-05 NOTE — Progress Notes (Signed)
 Her cholesterol and blood sugar are both borderline elevated.  Do not need to change medication management at this time for this however she should continue to work on diet and exercise and we can recheck this again in a year or so.  One of her liver numbers was very mildly elevated.  This is not a clinical concern however should probably be rechecked again at some point in the next 3 to 6 months.  She can come back here to have this done or she can do this with her new primary care doctor in [place]  All of her other labs are stable and can be rechecked in a year.

## 2024-11-08 ENCOUNTER — Other Ambulatory Visit: Payer: Self-pay | Admitting: Family Medicine

## 2024-11-08 DIAGNOSIS — E7800 Pure hypercholesterolemia, unspecified: Secondary | ICD-10-CM

## 2024-11-29 ENCOUNTER — Encounter: Admitting: Orthopaedic Surgery

## 2024-12-06 ENCOUNTER — Encounter: Admitting: Orthopaedic Surgery

## 2025-02-21 ENCOUNTER — Ambulatory Visit
